# Patient Record
Sex: Male | Born: 1945 | Race: White | Hispanic: No | Marital: Married | Smoking: Never smoker
Health system: Southern US, Community
[De-identification: ages and names within clinical notes are randomized; demographics above are authoritative.]

## PROBLEM LIST (undated history)

## (undated) DIAGNOSIS — E119 Type 2 diabetes mellitus without complications: Secondary | ICD-10-CM

## (undated) DIAGNOSIS — L719 Rosacea, unspecified: Secondary | ICD-10-CM

## (undated) DIAGNOSIS — K219 Gastro-esophageal reflux disease without esophagitis: Secondary | ICD-10-CM

## (undated) DIAGNOSIS — I10 Essential (primary) hypertension: Secondary | ICD-10-CM

## (undated) DIAGNOSIS — K635 Polyp of colon: Secondary | ICD-10-CM

## (undated) DIAGNOSIS — I639 Cerebral infarction, unspecified: Secondary | ICD-10-CM

## (undated) DIAGNOSIS — G4730 Sleep apnea, unspecified: Secondary | ICD-10-CM

## (undated) DIAGNOSIS — E785 Hyperlipidemia, unspecified: Secondary | ICD-10-CM

## (undated) HISTORY — DX: Hyperlipidemia, unspecified: E78.5

## (undated) HISTORY — DX: Rosacea, unspecified: L71.9

## (undated) HISTORY — DX: Sleep apnea, unspecified: G47.30

## (undated) HISTORY — DX: Essential (primary) hypertension: I10

## (undated) HISTORY — DX: Type 2 diabetes mellitus without complications: E11.9

## (undated) HISTORY — PX: COLONOSCOPY: SHX174

## (undated) HISTORY — DX: Cerebral infarction, unspecified: I63.9

## (undated) HISTORY — DX: Polyp of colon: K63.5

---

## 1996-08-14 HISTORY — PX: ANAL FISSURE REPAIR: SHX2312

## 1998-08-14 HISTORY — PX: OTHER SURGICAL HISTORY: SHX169

## 2003-08-15 DIAGNOSIS — K635 Polyp of colon: Secondary | ICD-10-CM

## 2003-08-15 HISTORY — DX: Polyp of colon: K63.5

## 2004-07-28 ENCOUNTER — Ambulatory Visit: Payer: Self-pay | Admitting: Family Medicine

## 2004-08-04 ENCOUNTER — Ambulatory Visit: Payer: Self-pay | Admitting: General Surgery

## 2004-08-24 ENCOUNTER — Ambulatory Visit: Payer: Self-pay | Admitting: Urology

## 2004-12-12 ENCOUNTER — Ambulatory Visit: Payer: Self-pay | Admitting: Family Medicine

## 2007-07-08 ENCOUNTER — Ambulatory Visit: Payer: Self-pay | Admitting: Specialist

## 2007-07-08 ENCOUNTER — Other Ambulatory Visit: Payer: Self-pay

## 2007-07-15 HISTORY — PX: REPLACEMENT TOTAL KNEE: SUR1224

## 2007-07-18 ENCOUNTER — Inpatient Hospital Stay: Payer: Self-pay | Admitting: Specialist

## 2008-01-01 ENCOUNTER — Ambulatory Visit: Payer: Self-pay | Admitting: Specialist

## 2008-01-01 ENCOUNTER — Other Ambulatory Visit: Payer: Self-pay

## 2008-01-10 ENCOUNTER — Ambulatory Visit: Payer: Self-pay | Admitting: Specialist

## 2008-04-23 ENCOUNTER — Ambulatory Visit: Payer: Self-pay | Admitting: Specialist

## 2008-11-20 DIAGNOSIS — E1159 Type 2 diabetes mellitus with other circulatory complications: Secondary | ICD-10-CM | POA: Insufficient documentation

## 2008-11-20 DIAGNOSIS — Z9660 Presence of unspecified orthopedic joint implant: Secondary | ICD-10-CM | POA: Insufficient documentation

## 2008-11-20 DIAGNOSIS — E119 Type 2 diabetes mellitus without complications: Secondary | ICD-10-CM | POA: Insufficient documentation

## 2008-11-20 DIAGNOSIS — G4733 Obstructive sleep apnea (adult) (pediatric): Secondary | ICD-10-CM | POA: Insufficient documentation

## 2008-12-31 ENCOUNTER — Ambulatory Visit: Payer: Self-pay | Admitting: General Surgery

## 2009-02-20 ENCOUNTER — Emergency Department: Payer: Self-pay | Admitting: Emergency Medicine

## 2009-09-06 DIAGNOSIS — K219 Gastro-esophageal reflux disease without esophagitis: Secondary | ICD-10-CM | POA: Insufficient documentation

## 2009-09-09 ENCOUNTER — Ambulatory Visit: Payer: Self-pay | Admitting: Family Medicine

## 2009-09-22 ENCOUNTER — Ambulatory Visit: Payer: Self-pay | Admitting: Cardiology

## 2012-05-30 ENCOUNTER — Ambulatory Visit: Payer: Self-pay | Admitting: Family Medicine

## 2013-10-08 ENCOUNTER — Ambulatory Visit: Payer: Self-pay | Admitting: Urology

## 2013-12-15 IMAGING — CR SACRUM AND COCCYX - 2+ VIEW
1 series · 4 of 4 positions shown · non-contrast
Comparison: none

REASON FOR EXAM: coccyx pain
COMMENTS:

PROCEDURE:     KDR - KDXR SACRUM AND COCCYX  - May 30, 2012  [DATE]
RESULT:     Images of the sacrum and coccyx show the sacral arches appear
intact. No fracture or dislocation is evident. Sacroiliac joints appear to
be unremarkable.

[Series 1: ap · 0.17mm/px · 4 of 4 slices shown]
[im 1/4]
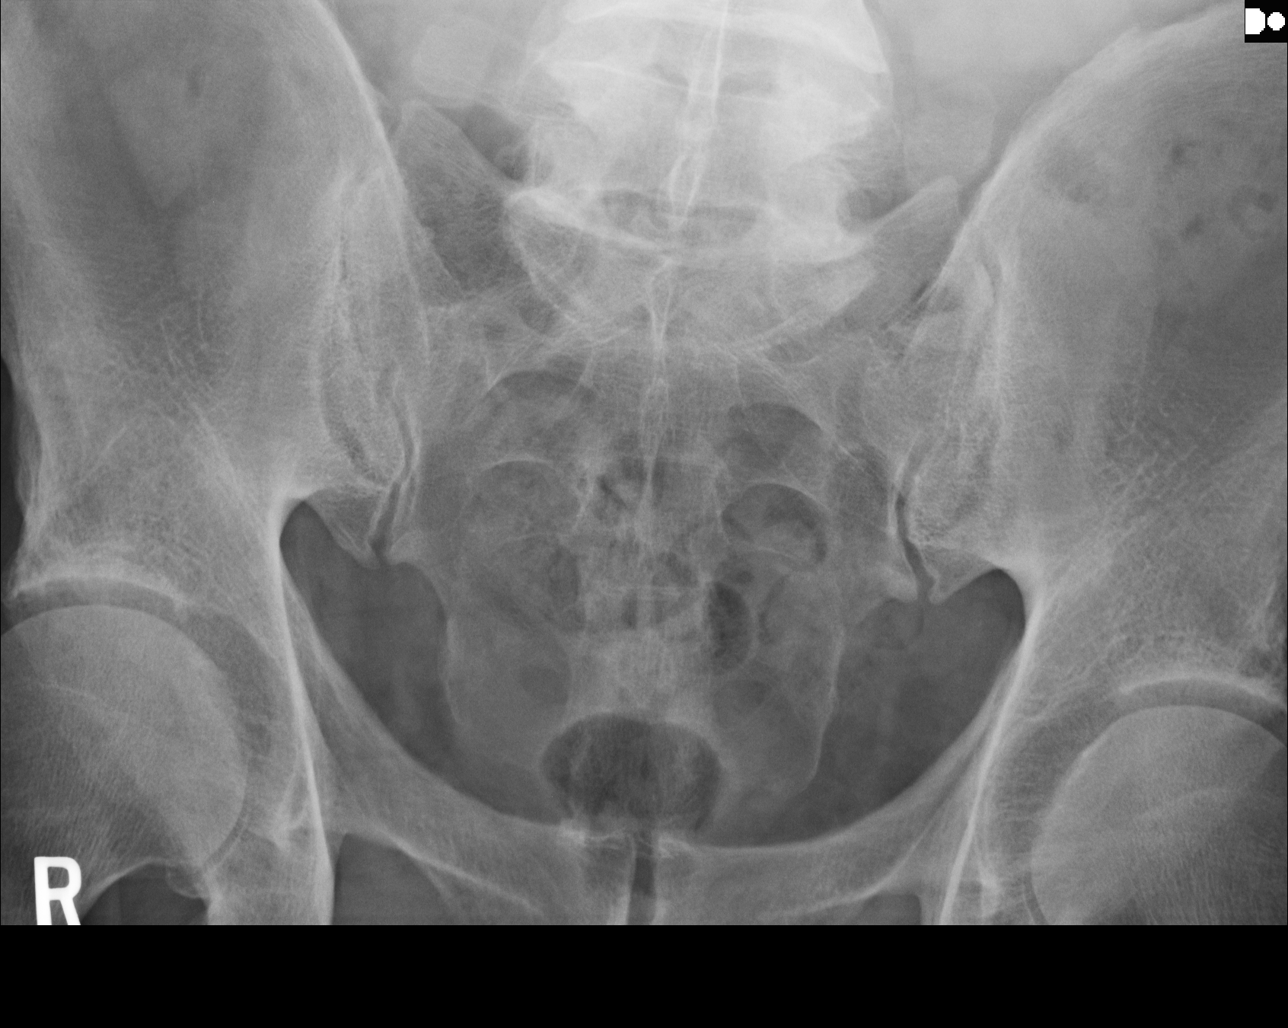
[im 2/4]
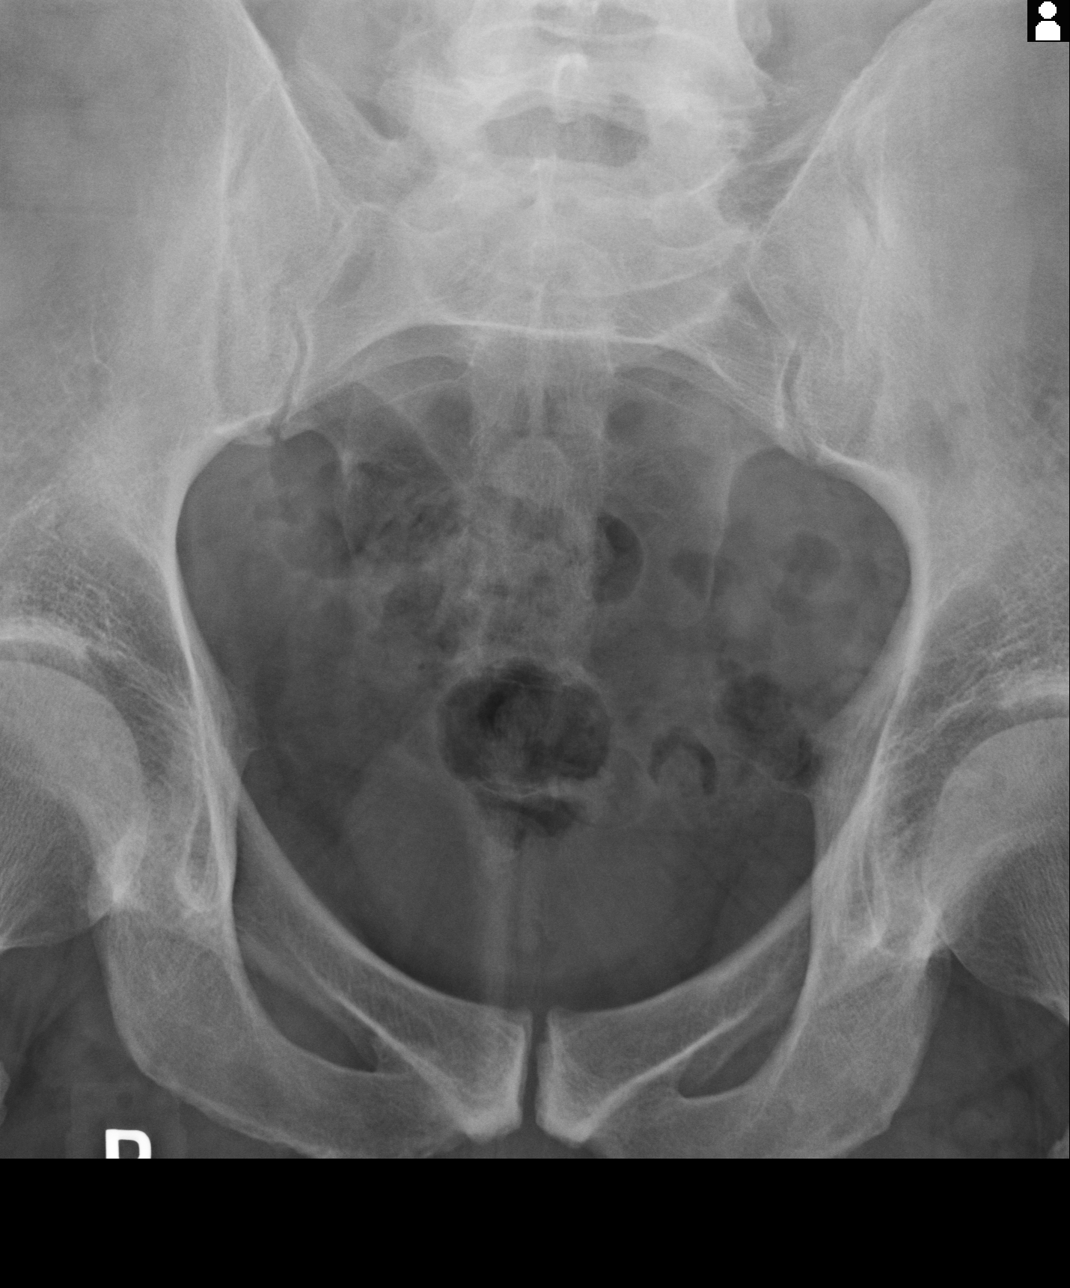
[im 3/4]
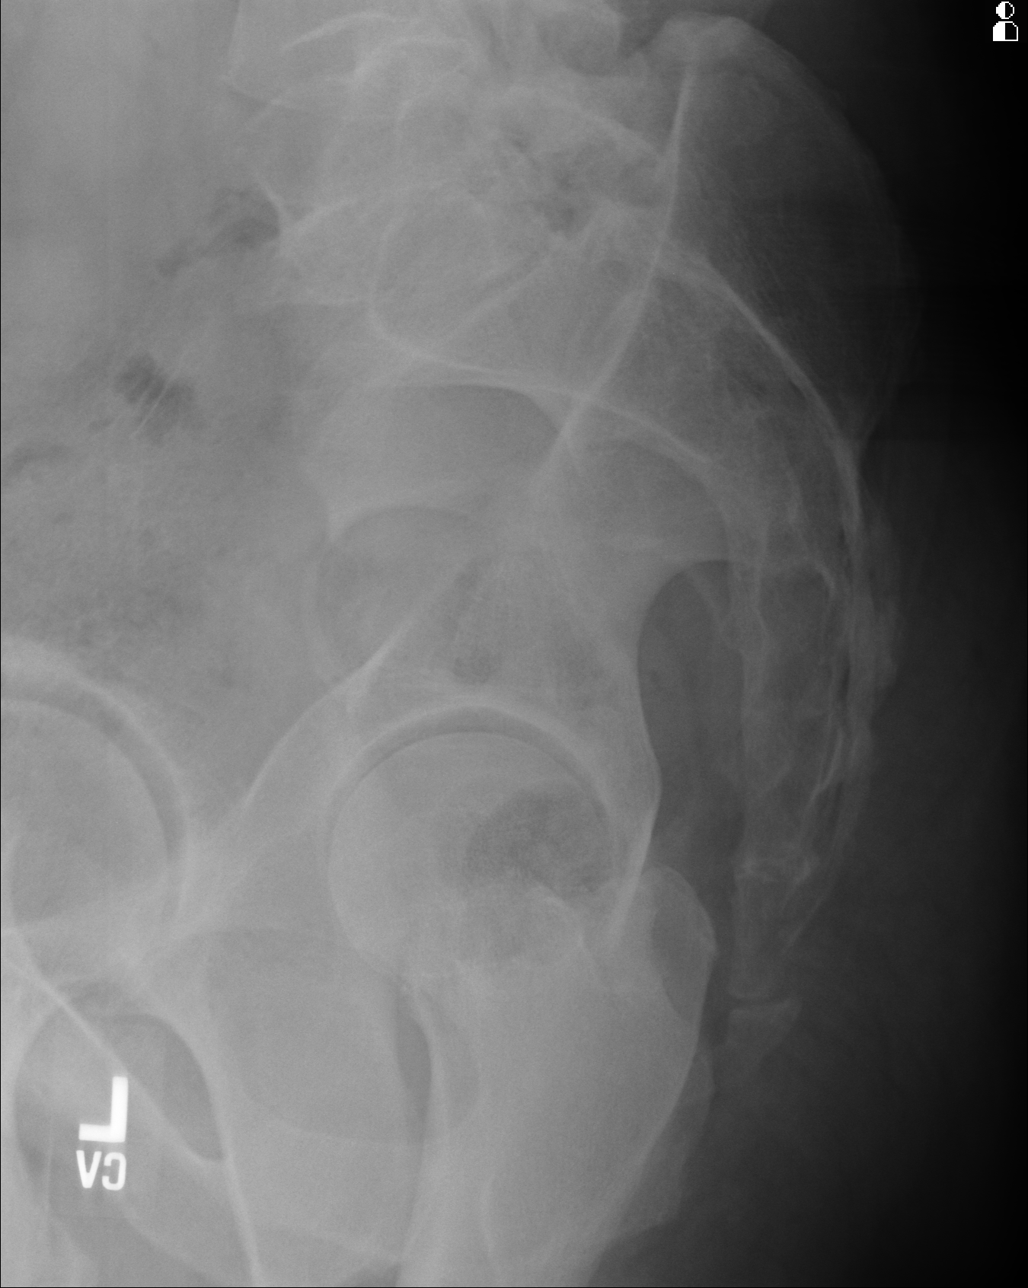
[im 4/4]
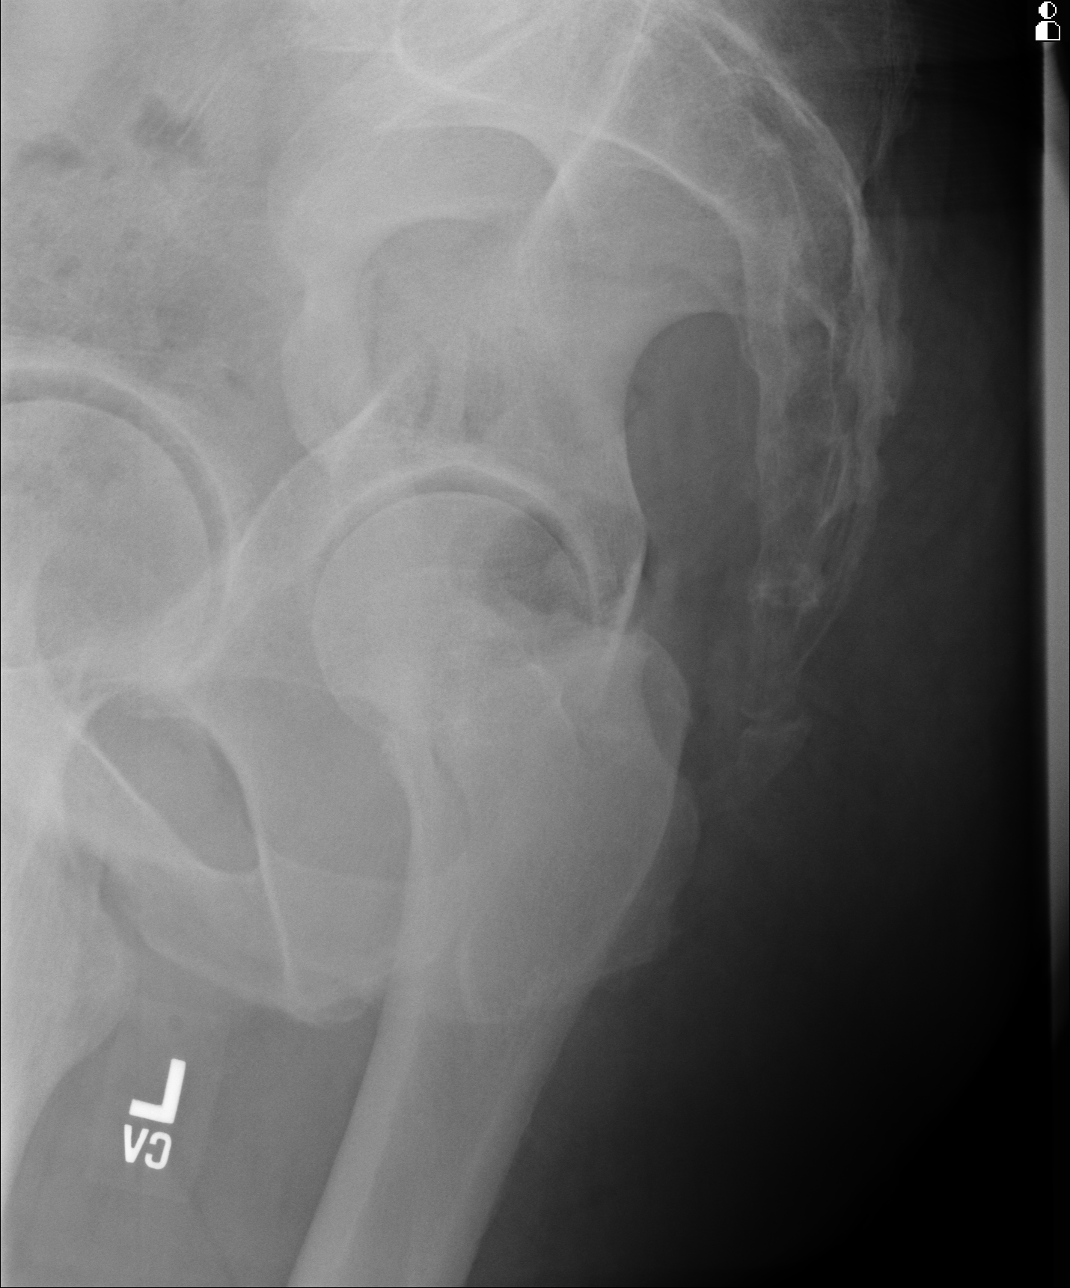

[4 of 4 positions shown; findings below may reference images not displayed]

IMPRESSION: No acute bony abnormality evident.

[REDACTED]

## 2014-07-06 LAB — LIPID PANEL
CHOLESTEROL: 115 mg/dL (ref 0–200)
HDL: 38 mg/dL (ref 35–70)
LDL Cholesterol: 51 mg/dL
TRIGLYCERIDES: 128 mg/dL (ref 40–160)

## 2014-07-06 LAB — BASIC METABOLIC PANEL
BUN: 14 mg/dL (ref 4–21)
Creatinine: 1.1 mg/dL (ref <=1.3)
Glucose: 128 mg/dL
Potassium: 3.8 mmol/L (ref 3.4–5.3)
SODIUM: 143 mmol/L (ref 137–147)

## 2014-07-06 LAB — HEPATIC FUNCTION PANEL
ALT: 32 U/L (ref 10–40)
AST: 22 U/L (ref 14–40)

## 2014-07-06 LAB — TSH: TSH: 2.04 u[IU]/mL (ref <=5.90)

## 2014-07-07 ENCOUNTER — Encounter: Payer: Self-pay | Admitting: *Deleted

## 2014-07-27 ENCOUNTER — Ambulatory Visit (INDEPENDENT_AMBULATORY_CARE_PROVIDER_SITE_OTHER): Payer: Medicare Other | Admitting: General Surgery

## 2014-07-27 ENCOUNTER — Encounter: Payer: Self-pay | Admitting: *Deleted

## 2014-07-27 ENCOUNTER — Other Ambulatory Visit: Payer: Self-pay | Admitting: *Deleted

## 2014-07-27 ENCOUNTER — Encounter: Payer: Self-pay | Admitting: General Surgery

## 2014-07-27 VITALS — BP 130/72 | HR 72 | Resp 14 | Ht 74.0 in | Wt 240.0 lb

## 2014-07-27 DIAGNOSIS — Z86010 Personal history of colonic polyps: Secondary | ICD-10-CM

## 2014-07-27 LAB — CBC AND DIFFERENTIAL
HEMATOCRIT: 43 % (ref 41–53)
Hemoglobin: 15.1 g/dL (ref 13.5–17.5)
PLATELETS: 155 10*3/uL (ref 150–399)
WBC: 5.6 10^3/mL

## 2014-07-27 MED ORDER — POLYETHYLENE GLYCOL 3350 17 GM/SCOOP PO POWD
ORAL | Status: DC
Start: 2014-07-27 — End: 2015-01-21

## 2014-07-27 NOTE — Progress Notes (Signed)
Patient ID: BRECK MARYLAND, male   DOB: 05/15/1946, 68 y.o.   MRN: [id number]  Patient has been scheduled for a colonoscopy on 09-02-14 at [facility].   This patient has been asked to hold metformin and glipizide day of prep and procedure.

## 2014-07-27 NOTE — Patient Instructions (Signed)
Colonoscopy  A colonoscopy is an exam to look at the entire large intestine (colon). This exam can help find problems such as tumors, polyps, inflammation, and areas of bleeding. The exam takes about 1 hour.   LET YOUR HEALTH CARE PROVIDER KNOW ABOUT:   · Any allergies you have.  · All medicines you are taking, including vitamins, herbs, eye drops, creams, and over-the-counter medicines.  · Previous problems you or members of your family have had with the use of anesthetics.  · Any blood disorders you have.  · Previous surgeries you have had.  · Medical conditions you have.  RISKS AND COMPLICATIONS   Generally, this is a safe procedure. However, as with any procedure, complications can occur. Possible complications include:  · Bleeding.  · Tearing or rupture of the colon wall.  · Reaction to medicines given during the exam.  · Infection (rare).  BEFORE THE PROCEDURE   · Ask your health care provider about changing or stopping your regular medicines.  · You may be prescribed an oral bowel prep. This involves drinking a large amount of medicated liquid, starting the day before your procedure. The liquid will cause you to have multiple loose stools until your stool is almost clear or light green. This cleans out your colon in preparation for the procedure.  · Do not eat or drink anything else once you have started the bowel prep, unless your health care provider tells you it is safe to do so.  · Arrange for someone to drive you home after the procedure.  PROCEDURE   · You will be given medicine to help you relax (sedative).  · You will lie on your side with your knees bent.  · A long, flexible tube with a light and camera on the end (colonoscope) will be inserted through the rectum and into the colon. The camera sends video back to a computer screen as it moves through the colon. The colonoscope also releases carbon dioxide gas to inflate the colon. This helps your health care provider see the area better.  · During  the exam, your health care provider may take a small tissue sample (biopsy) to be examined under a microscope if any abnormalities are found.  · The exam is finished when the entire colon has been viewed.  AFTER THE PROCEDURE   · Do not drive for 24 hours after the exam.  · You may have a small amount of blood in your stool.  · You may pass moderate amounts of gas and have mild abdominal cramping or bloating. This is caused by the gas used to inflate your colon during the exam.  · Ask when your test results will be ready and how you will get your results. Make sure you get your test results.  Document Released: 07/28/2000 Document Revised: 05/21/2013 Document Reviewed: 04/07/2013  [employer]® Patient Information ©2015 [employer]. This information is not intended to replace advice given to you by your health care provider. Make sure you discuss any questions you have with your health care provider.

## 2014-07-27 NOTE — Progress Notes (Signed)
Patient ID: ESAIAS CLEAVENGER, male   DOB: March 05, 1946, 68 y.o.   MRN: [id number]  Chief Complaint  Patient presents with  . Follow-up    colonoscopy    HPI EGE MUCKEY is a 68 y.o. male  Here for colonoscopy discussion. This was last done in 2010. He reports he has had runny bowel movements for about 6-8 months that have worsened in the last 3-4 months. His doctor tried stopping his Metformin which did not change anything. He is now back on Metformin with no change in bowel movements.   HPI  Past Medical History  Diagnosis Date  . Rosacea   . Diabetes mellitus   . Hyperlipidemia   . Sleep apnea   . Hypertension   . Colon polyp 2005    Past Surgical History  Procedure Laterality Date  . Anal fissure repair  1998  . Shoulder spurs  2000  . Colonoscopy  2005, 2010  . 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 No pertinent family history.  Social History History  Substance Use Topics  . Smoking status: Never Smoker   . Smokeless tobacco: Never Used  . Alcohol Use: 0.0 oz/week    0 Not specified per week    No Known Allergies  Current Outpatient Prescriptions  Medication Sig Dispense Refill  . aspirin 81 MG tablet Take 81 mg by mouth daily.    Marland Kitchen doxycycline (VIBRAMYCIN) 100 MG capsule Take 100 mg by mouth daily.  4  . glipiZIDE (GLUCOTROL XL) 5 MG 24 hr tablet   5  . loratadine-pseudoephedrine (CLARITIN-D 24-HOUR) 10-240 MG per 24 hr tablet Take 1 tablet by mouth daily as needed for allergies.    Marland Kitchen losartan-hydrochlorothiazide (HYZAAR) 100-12.5 MG per tablet   5  . metFORMIN (GLUCOPHAGE) 1000 MG tablet Take 1,000 mg by mouth 2 (two) times daily.  5  . metoprolol succinate (TOPROL-XL) 25 MG 24 hr tablet   5  . omeprazole (PRILOSEC) 40 MG capsule Take 40 mg by mouth daily.  11  . simvastatin (ZOCOR) 20 MG tablet   5  . triazolam (HALCION) 0.125 MG tablet Take 0.125 mg by mouth at bedtime as needed for sleep.    . vitamin B-12 (CYANOCOBALAMIN) 1000 MCG  tablet Take 1,000 mcg by mouth daily.     No current facility-administered medications for this visit.    Review of Systems Review of Systems  Constitutional: Negative.   Respiratory: Negative.   Cardiovascular: Negative.   Gastrointestinal: Positive for diarrhea. Negative for nausea, vomiting, abdominal pain, constipation, blood in stool, abdominal distention, anal bleeding and rectal pain.    Blood pressure 130/72, pulse 72, resp. rate 14, height 6\' 2"  (1.88 m), weight 240 lb (108.863 kg).  Physical Exam Physical Exam  Constitutional: He is oriented to person, place, and time. He appears well-developed and well-nourished.  Eyes: Conjunctivae are normal. No scleral icterus.  Neck: Neck supple.  Cardiovascular: Normal rate, regular rhythm and normal heart sounds.   Pulmonary/Chest: Effort normal and breath sounds normal.  Abdominal: Soft. Normal appearance and bowel sounds are normal. There is no tenderness. No hernia.  Lymphadenopathy:    He has no cervical adenopathy.  Neurological: He is alert and oriented to person, place, and time.    Data Reviewed Prior colonoscopy.  Assessment    History of adenomatous colon polyp     Plan    Surveillance colonoscopy. Discussed with pt and he is agreeable. He is familiar  with procedure, risks and benefits.   Cc: Margarita Rana, M.D.       SANKAR,SEEPLAPUTHUR G 07/27/2014, 10:10 AM

## 2014-08-27 ENCOUNTER — Other Ambulatory Visit: Payer: Self-pay | Admitting: General Surgery

## 2014-08-27 DIAGNOSIS — Z86010 Personal history of colonic polyps: Secondary | ICD-10-CM

## 2014-09-02 ENCOUNTER — Ambulatory Visit: Payer: Self-pay | Admitting: General Surgery

## 2014-09-02 DIAGNOSIS — Z86010 Personal history of colonic polyps: Secondary | ICD-10-CM

## 2014-09-02 DIAGNOSIS — K648 Other hemorrhoids: Secondary | ICD-10-CM

## 2014-09-02 DIAGNOSIS — K5730 Diverticulosis of large intestine without perforation or abscess without bleeding: Secondary | ICD-10-CM

## 2014-09-03 ENCOUNTER — Encounter: Payer: Self-pay | Admitting: General Surgery

## 2014-09-08 ENCOUNTER — Encounter: Payer: Self-pay | Admitting: General Surgery

## 2014-09-10 LAB — HM DIABETES EYE EXAM

## 2014-10-05 LAB — HEMOGLOBIN A1C: Hgb A1c MFr Bld: 7.4 % — AB (ref 4.0–6.0)

## 2014-10-09 DIAGNOSIS — R0789 Other chest pain: Secondary | ICD-10-CM | POA: Insufficient documentation

## 2014-10-29 ENCOUNTER — Ambulatory Visit: Payer: Self-pay | Admitting: Gastroenterology

## 2014-10-29 LAB — HM COLONOSCOPY

## 2014-12-07 LAB — SURGICAL PATHOLOGY

## 2015-01-07 DIAGNOSIS — E1169 Type 2 diabetes mellitus with other specified complication: Secondary | ICD-10-CM | POA: Insufficient documentation

## 2015-01-07 DIAGNOSIS — E785 Hyperlipidemia, unspecified: Secondary | ICD-10-CM | POA: Insufficient documentation

## 2015-01-07 DIAGNOSIS — IMO0002 Reserved for concepts with insufficient information to code with codable children: Secondary | ICD-10-CM | POA: Insufficient documentation

## 2015-01-07 DIAGNOSIS — R748 Abnormal levels of other serum enzymes: Secondary | ICD-10-CM | POA: Insufficient documentation

## 2015-01-07 DIAGNOSIS — M179 Osteoarthritis of knee, unspecified: Secondary | ICD-10-CM | POA: Insufficient documentation

## 2015-01-07 DIAGNOSIS — E538 Deficiency of other specified B group vitamins: Secondary | ICD-10-CM | POA: Insufficient documentation

## 2015-01-07 DIAGNOSIS — K760 Fatty (change of) liver, not elsewhere classified: Secondary | ICD-10-CM | POA: Insufficient documentation

## 2015-01-07 DIAGNOSIS — M1710 Unilateral primary osteoarthritis, unspecified knee: Secondary | ICD-10-CM | POA: Insufficient documentation

## 2015-01-07 DIAGNOSIS — N509 Disorder of male genital organs, unspecified: Secondary | ICD-10-CM | POA: Insufficient documentation

## 2015-01-07 DIAGNOSIS — L719 Rosacea, unspecified: Secondary | ICD-10-CM | POA: Insufficient documentation

## 2015-01-07 DIAGNOSIS — G4700 Insomnia, unspecified: Secondary | ICD-10-CM | POA: Insufficient documentation

## 2015-01-14 ENCOUNTER — Ambulatory Visit: Payer: Self-pay | Admitting: Family Medicine

## 2015-01-21 ENCOUNTER — Encounter: Payer: Self-pay | Admitting: Family Medicine

## 2015-01-21 ENCOUNTER — Ambulatory Visit (INDEPENDENT_AMBULATORY_CARE_PROVIDER_SITE_OTHER): Payer: Medicare Other | Admitting: Family Medicine

## 2015-01-21 VITALS — BP 120/64 | HR 88 | Temp 97.8°F | Resp 16 | Ht 74.0 in | Wt 239.0 lb

## 2015-01-21 DIAGNOSIS — Z1389 Encounter for screening for other disorder: Secondary | ICD-10-CM | POA: Diagnosis not present

## 2015-01-21 DIAGNOSIS — R0789 Other chest pain: Secondary | ICD-10-CM | POA: Diagnosis not present

## 2015-01-21 DIAGNOSIS — R234 Changes in skin texture: Secondary | ICD-10-CM | POA: Diagnosis not present

## 2015-01-21 DIAGNOSIS — E7800 Pure hypercholesterolemia, unspecified: Secondary | ICD-10-CM

## 2015-01-21 DIAGNOSIS — K589 Irritable bowel syndrome without diarrhea: Secondary | ICD-10-CM | POA: Diagnosis not present

## 2015-01-21 DIAGNOSIS — E119 Type 2 diabetes mellitus without complications: Secondary | ICD-10-CM | POA: Diagnosis not present

## 2015-01-21 DIAGNOSIS — I10 Essential (primary) hypertension: Secondary | ICD-10-CM | POA: Diagnosis not present

## 2015-01-21 DIAGNOSIS — R239 Unspecified skin changes: Secondary | ICD-10-CM

## 2015-01-21 LAB — POCT GLYCOSYLATED HEMOGLOBIN (HGB A1C): Hemoglobin A1C: 7

## 2015-01-21 MED ORDER — DOXYCYCLINE HYCLATE 100 MG PO CAPS: 100.0000 mg | ORAL_CAPSULE | Freq: Every day | ORAL | Status: DC

## 2015-01-21 MED ORDER — METFORMIN HCL 1000 MG PO TABS: 1000.0000 mg | ORAL_TABLET | Freq: Two times a day (BID) | ORAL | Status: DC

## 2015-01-21 MED ORDER — SIMVASTATIN 20 MG PO TABS: 20.0000 mg | ORAL_TABLET | Freq: Every evening | ORAL | Status: DC

## 2015-01-21 NOTE — Progress Notes (Signed)
Subjective:    Patient ID: Ivan Kennedy, male    DOB: December 05, 1945, 69 y.o.   MRN: [id number]  HPI Comments: Pt is requesting refills Simvastatin, Doxy, and Metformin.  Pt's last A1C was 10/05/2014, and the results was 7.4% Pt's last BP on 10/05/2014 and was 128/76.  Diabetes He presents for his follow-up diabetic visit. He has type 2 diabetes mellitus. There are no hypoglycemic associated symptoms. Pertinent negatives for hypoglycemia include no headaches. Associated symptoms include chest pain (F/B Dr. Ubaldo Glassing for this problem, negative work up). Pertinent negatives for diabetes include no blurred vision, no fatigue, no foot paresthesias, no foot ulcerations, no polydipsia, no polyphagia, no polyuria, no visual change, no weakness and no weight loss. Current diabetic treatment includes oral agent (dual therapy). He is compliant with treatment all of the time. His weight is stable. He is following a generally healthy diet. He participates in exercise three times a week (pt walks). His home blood glucose trend is fluctuating minimally. His breakfast blood glucose is taken between 6-7 am. His breakfast blood glucose range is generally 110-130 mg/dl. (Pt reports BS can range from 110s-140s) He does not see a podiatrist.Eye exam is current.  Hypertension This is a chronic problem. The problem is controlled. Associated symptoms include chest pain (F/B Dr. Ubaldo Glassing for this problem, negative work up). Pertinent negatives include no anxiety, blurred vision, headaches, malaise/fatigue, neck pain, orthopnea, palpitations, peripheral edema or shortness of breath.   Chest pain at times still . Not related to stress.  Is on acid reflux pill. Has had an EGD years ago. Not recently.  Was recently diagnosed with IBS. No chest symptoms today.  Was working hard today with no pain. Can not really relate it to food. Still happens frequently.     Review of Systems  Constitutional: Negative for weight loss,  malaise/fatigue and fatigue.  Eyes: Negative for blurred vision.  Respiratory: Negative for shortness of breath.   Cardiovascular: Positive for chest pain (F/B Dr. Ubaldo Glassing for this problem, negative work up). Negative for palpitations and orthopnea.  Endocrine: Negative for polydipsia, polyphagia and polyuria.  Musculoskeletal: Negative for neck pain.  Neurological: Negative for weakness and headaches.   BP 120/64 mmHg  Pulse 88  Temp(Src) 97.8 F (36.6 C) (Oral)  Resp 16  Ht 6\' 2"  (1.88 m)  Wt 239 lb (108.41 kg)  BMI 30.67 kg/m2   Past Medical History  Diagnosis Date  . Rosacea   . Diabetes mellitus   . Hyperlipidemia   . Sleep apnea   . Hypertension   . Colon polyp 2005   Past Surgical History  Procedure Laterality Date  . Anal fissure repair  1998  . Shoulder spurs Right 2000  . Colonoscopy  2005, 2010  . 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 Earnestine Leys    reports that he has never smoked. He has never used smokeless tobacco. He reports that he drinks alcohol. He reports that he does not use illicit drugs. family history includes Diabetes in his mother. No Known Allergies  Current Outpatient Prescriptions on File Prior to Visit  Medication Sig Dispense Refill  . aspirin 81 MG tablet Take 81 mg by mouth daily.    Marland Kitchen glipiZIDE (GLUCOTROL XL) 5 MG 24 hr tablet   5  . loratadine-pseudoephedrine (CLARITIN-D 24-HOUR) 10-240 MG per 24 hr tablet Take 1 tablet by mouth daily as needed for allergies.    Marland Kitchen losartan-hydrochlorothiazide (HYZAAR) 100-12.5 MG per tablet   5  .  metoprolol succinate (TOPROL-XL) 25 MG 24 hr tablet   5  . omeprazole (PRILOSEC) 40 MG capsule Take 40 mg by mouth daily.  11  . vitamin B-12 (CYANOCOBALAMIN) 1000 MCG tablet Take 1,000 mcg by mouth daily.     No current facility-administered medications on file prior to visit.      Objective:   Physical Exam  Constitutional: He is oriented to person, place, and time. He appears well-developed.   Cardiovascular: Normal rate and regular rhythm.   Pulmonary/Chest: Effort normal and breath sounds normal.  Neurological: He is alert and oriented to person, place, and time.  Psychiatric: He has a normal mood and affect.    BP 120/64 mmHg  Pulse 88  Temp(Src) 97.8 F (36.6 C) (Oral)  Resp 16  Ht 6\' 2"  (1.88 m)  Wt 239 lb (108.41 kg)  BMI 30.67 kg/m2       Assessment & Plan:   1. Essential hypertension  Condition is stable. Please continue current medication and  plan of care as noted.    2. Type 2 diabetes mellitus without complication Improved. Continue current plan of care. Recheck in 3 months.   - POCT HgB A1C  3. Hypercholesteremia  Condition is stable. Please continue current medication and  plan of care as noted.    4. IBS (irritable bowel syndrome) Being treated by Dr. Allen Norris. Improving.   5. Chest pain, atypical  Negative cardiac work up. Patient is going to focus on better history of when it happens and follow up is continues.   6. Recent skin changes  Referral to Dermatology.

## 2015-02-08 ENCOUNTER — Other Ambulatory Visit: Payer: Self-pay | Admitting: Family Medicine

## 2015-02-08 DIAGNOSIS — K219 Gastro-esophageal reflux disease without esophagitis: Secondary | ICD-10-CM

## 2015-04-21 ENCOUNTER — Encounter: Payer: Self-pay | Admitting: Family Medicine

## 2015-04-21 ENCOUNTER — Ambulatory Visit (INDEPENDENT_AMBULATORY_CARE_PROVIDER_SITE_OTHER): Payer: Medicare Other | Admitting: Family Medicine

## 2015-04-21 VITALS — BP 128/76 | HR 68 | Temp 97.8°F | Resp 16 | Wt 241.0 lb

## 2015-04-21 DIAGNOSIS — E119 Type 2 diabetes mellitus without complications: Secondary | ICD-10-CM | POA: Diagnosis not present

## 2015-04-21 DIAGNOSIS — G4700 Insomnia, unspecified: Secondary | ICD-10-CM | POA: Diagnosis not present

## 2015-04-21 DIAGNOSIS — I10 Essential (primary) hypertension: Secondary | ICD-10-CM

## 2015-04-21 LAB — POCT GLYCOSYLATED HEMOGLOBIN (HGB A1C): Hemoglobin A1C: 6.5

## 2015-04-21 MED ORDER — GLIPIZIDE ER 5 MG PO TB24: 5.0000 mg | ORAL_TABLET | Freq: Two times a day (BID) | ORAL | Status: DC

## 2015-04-21 MED ORDER — LOSARTAN POTASSIUM-HCTZ 100-12.5 MG PO TABS: 1.0000 | ORAL_TABLET | Freq: Every day | ORAL | Status: DC

## 2015-04-21 NOTE — Progress Notes (Signed)
Patient: Ivan Kennedy Male    DOB: Aug 05, 1946   69 y.o.   MRN: [id number] Visit Date: 04/21/2015  Today's Provider: Margarita Rana, MD   Chief Complaint  Patient presents with  . Diabetes  . Hypertension  . Insomnia  . Hyperlipidemia   Subjective:    Diabetes He presents for his follow-up diabetic visit. He has type 2 diabetes mellitus. His disease course has been stable. Associated symptoms include fatigue (Still a little fatiqued in the mornings but reports has improved since using his CPAP.). Pertinent negatives for diabetes include no blurred vision, no chest pain, no foot paresthesias, no foot ulcerations, no polydipsia, no polyphagia, no polyuria, no visual change, no weakness and no weight loss. Symptoms are stable. Risk factors for coronary artery disease include dyslipidemia, diabetes mellitus, hypertension and male sex. Current diabetic treatment includes oral agent (dual therapy). He is compliant with treatment all of the time. His weight is stable. He is following a diabetic (Eating less) diet. His overall blood glucose range is 110-130 mg/dl. He does not see a podiatrist.Eye exam is current.  Hypertension This is a chronic problem. The problem is unchanged. The problem is controlled. Pertinent negatives include no blurred vision or chest pain. Risk factors for coronary artery disease include diabetes mellitus, dyslipidemia and male gender. There are no compliance problems.   Hyperlipidemia This is a chronic problem. The problem is controlled. Recent lipid tests were reviewed and are normal. Pertinent negatives include no chest pain. Current antihyperlipidemic treatment includes statins. There are no compliance problems.  Risk factors for coronary artery disease include diabetes mellitus, dyslipidemia, male sex and hypertension.   Lab Results  Component Value Date   HGBA1C 7.0 01/21/2015     Lab Results  Component Value Date   CHOL 115 07/06/2014   HDL 38  07/06/2014   LDLCALC 51 07/06/2014   TRIG 128 07/06/2014     No Known Allergies Previous Medications   ASPIRIN 81 MG TABLET    Take 81 mg by mouth daily.   DIPHENOXYLATE-ATROPINE (LOMOTIL) 2.5-0.025 MG PER TABLET    Take 1 tablet by mouth 2 (two) times daily.   DOXYCYCLINE (ORACEA) 40 MG CAPSULE    Take 40 mg by mouth every morning.   FLUOROURACIL (EFUDEX) 5 % CREAM    APPLY TO AFFECTED AREA EVERY NIGHT FOR 6 WEEKS   LORATADINE-PSEUDOEPHEDRINE (CLARITIN-D 24-HOUR) 10-240 MG PER 24 HR TABLET    Take 1 tablet by mouth daily as needed for allergies.   METFORMIN (GLUCOPHAGE) 1000 MG TABLET    Take 1 tablet (1,000 mg total) by mouth 2 (two) times daily.   METOPROLOL SUCCINATE (TOPROL-XL) 25 MG 24 HR TABLET       METRONIDAZOLE (METROGEL) 1 % GEL    APPLY TO ROSACEA ONCE DAILY.   OMEPRAZOLE (PRILOSEC) 40 MG CAPSULE    1 CAPSULE DR, ORAL, DAILY   SIMVASTATIN (ZOCOR) 20 MG TABLET    Take 1 tablet (20 mg total) by mouth every evening.   VITAMIN B-12 (CYANOCOBALAMIN) 1000 MCG TABLET    Take 1,000 mcg by mouth daily.    Review of Systems  Constitutional: Positive for fatigue (Still a little fatiqued in the mornings but reports has improved since using his CPAP.). Negative for fever, chills, weight loss, diaphoresis, activity change, appetite change and unexpected weight change.  Eyes: Negative for blurred vision.  Respiratory: Negative.   Cardiovascular: Negative.  Negative for chest pain.  Gastrointestinal: Negative.  Endocrine: Negative for polydipsia, polyphagia and polyuria.  Musculoskeletal: Negative.   Neurological: Negative.  Negative for weakness.    Social History  Substance Use Topics  . Smoking status: Never Smoker   . Smokeless tobacco: Never Used  . Alcohol Use: 0.0 oz/week    0 Standard drinks or equivalent per week     Comment: occasionally   Objective:   BP 128/76 mmHg  Pulse 68  Temp(Src) 97.8 F (36.6 C) (Oral)  Resp 16  Wt 241 lb (109.317 kg)  Physical Exam    Constitutional: He is oriented to person, place, and time. He appears well-developed and well-nourished.  Cardiovascular: Normal rate and regular rhythm.   Pulmonary/Chest: Effort normal and breath sounds normal.  Neurological: He is alert and oriented to person, place, and time.  Psychiatric: He has a normal mood and affect. His behavior is normal. Judgment and thought content normal.   Diabetic Foot Exam - Simple   Simple Foot Form  Diabetic Foot exam was performed with the following findings:  Yes 04/21/2015  3:28 PM  Visual Inspection  No deformities, no ulcerations, no other skin breakdown bilaterally:  Yes  Sensation Testing  Intact to touch and monofilament testing bilaterally:  Yes  Pulse Check  Posterior Tibialis and Dorsalis pulse intact bilaterally:  Yes  Comments         Assessment & Plan:     1. Type 2 diabetes mellitus without complication Condition is stable, HgbA1c improved. Please continue current medication and  plan of care as noted.  Recheck in 3 months.  - POCT glycosylated hemoglobin (Hb A1C) - glipiZIDE (GLUCOTROL XL) 5 MG 24 hr tablet; Take 1 tablet (5 mg total) by mouth 2 (two) times daily.  Dispense: 180 tablet; Refill: 3 Results for orders placed or performed in visit on 04/21/15  POCT glycosylated hemoglobin (Hb A1C)  Result Value Ref Range   Hemoglobin A1C 6.5   HM DIABETES EYE EXAM  Result Value Ref Range   HM Diabetic Eye Exam No Retinopathy No Retinopathy    2. Essential hypertension Condition is stable. Please continue current medication and  plan of care as noted.   - losartan-hydrochlorothiazide (HYZAAR) 100-12.5 MG per tablet; Take 1 tablet by mouth daily.  Dispense: 90 tablet; Refill: 3  3. Hyperlipidemia- Stable. Continue current medication. Recheck at next OV. Tolerating medication without any difficulty.      Patient was seen and examined by Jerrell Belfast, MD, and note scribed, in part,  by Ashley Royalty, [place].  I have reviewed the  document for accuracy and completeness and I agree with above. - Jerrell Belfast, MD   Margarita Rana, MD  [facility]

## 2015-04-25 IMAGING — US US SCROTUM W/ DOPPLER COMPLETE
1 series · 14 of 25 positions shown · non-contrast
Comparison: None.

CLINICAL DATA: Evaluate scrotal mass.

EXAM:
SCROTAL ULTRASOUND
DOPPLER ULTRASOUND OF THE TESTICLES
TECHNIQUE: Complete ultrasound examination of the testicles, epididymis, and
other scrotal structures was performed. Color and spectral Doppler
ultrasound were also utilized to evaluate blood flow to the
testicles.

[Series 1: us scrotum w/ doppler complete · 0.08mm/px · 14 of 56 slices shown]
[im 1/56]
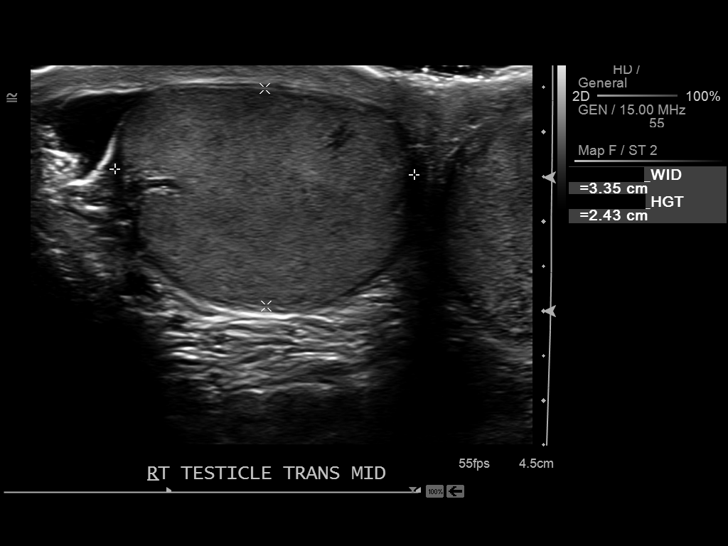
[im 5/56]
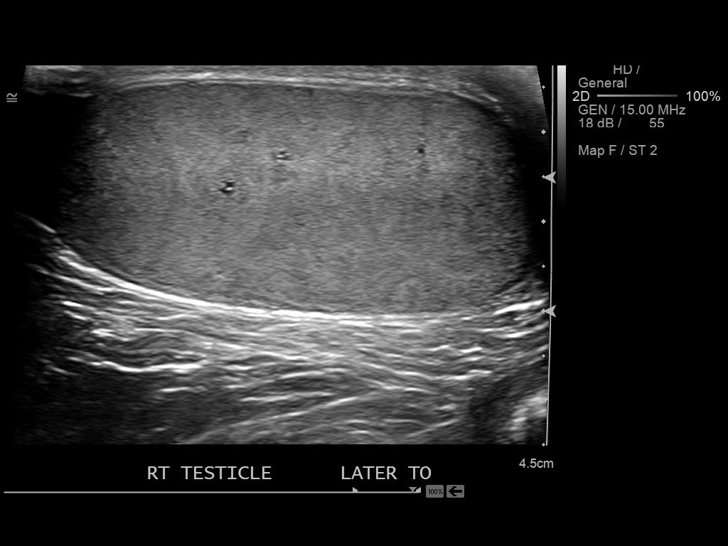
[im 10/56]
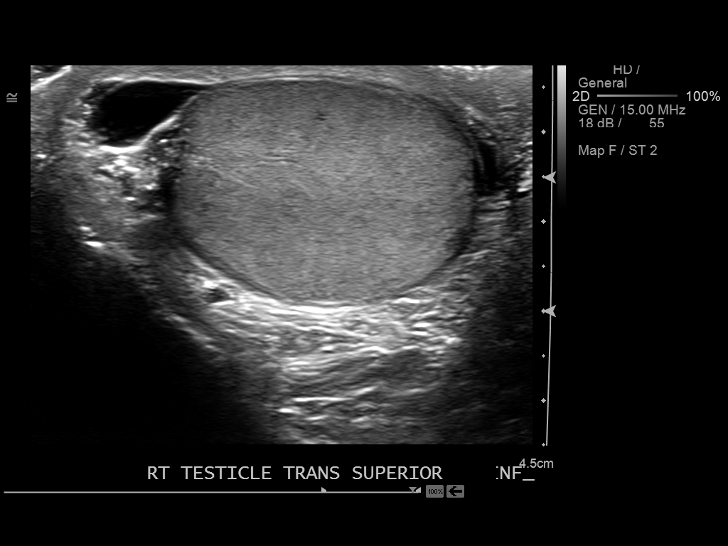
[im 14/56]
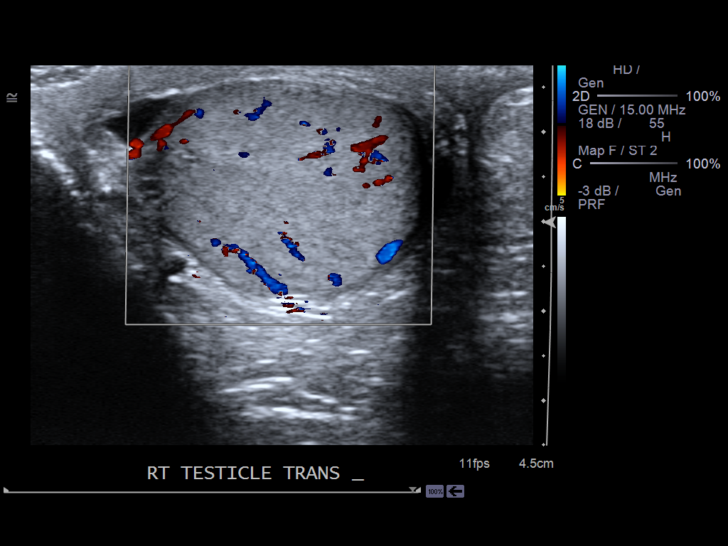
[im 19/56]
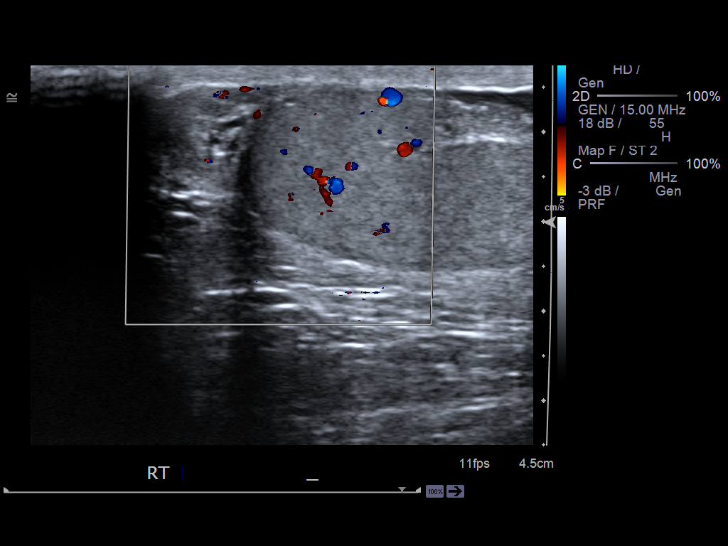
[im 21/56]
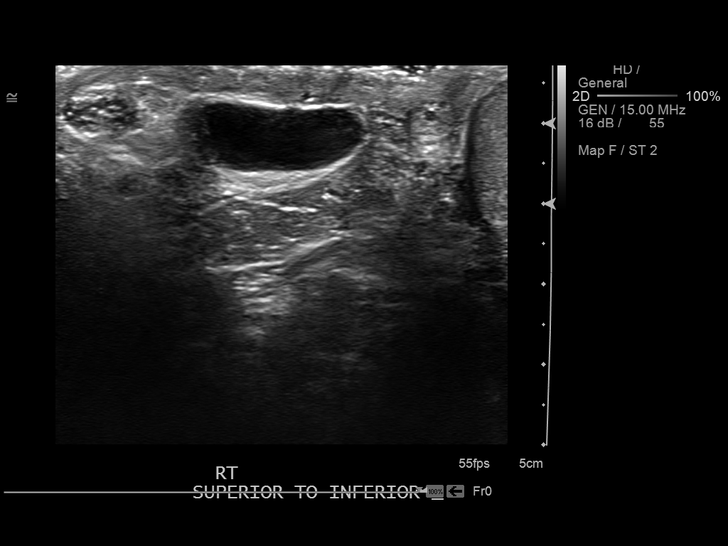
[im 26/56]
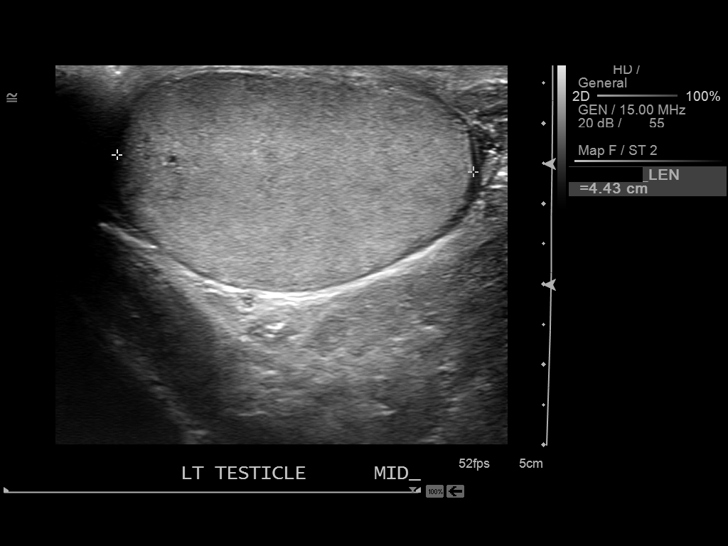
[im 30/56]
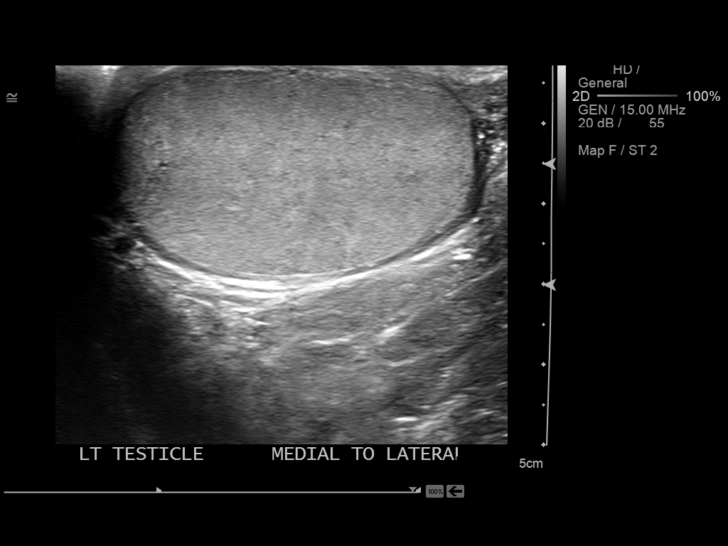
[im 35/56]
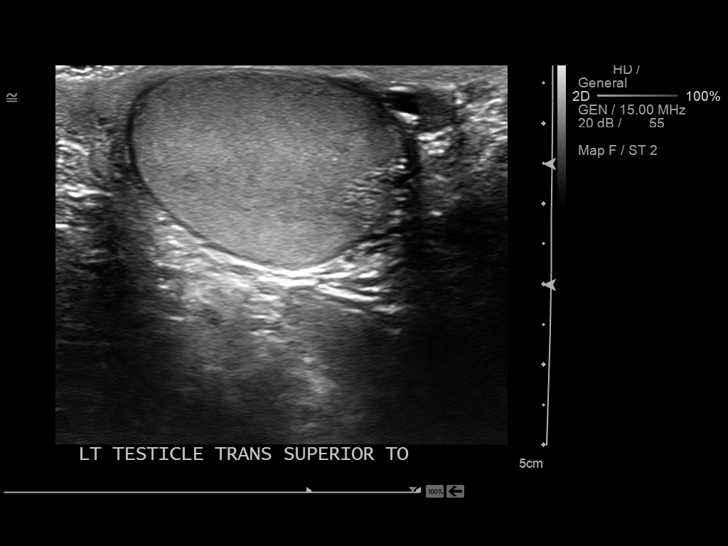
[im 37/56]
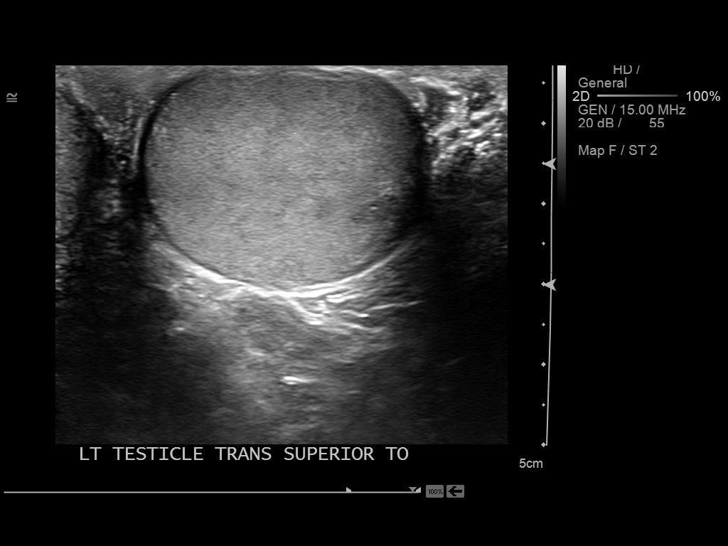
[im 42/56]
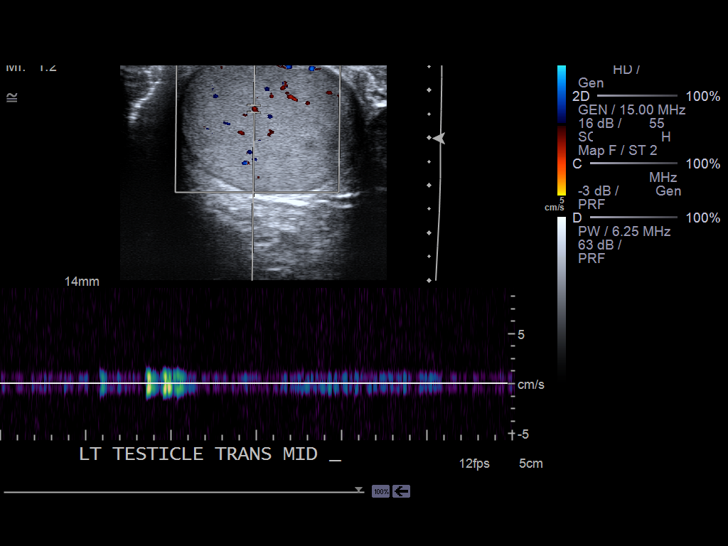
[im 46/56]
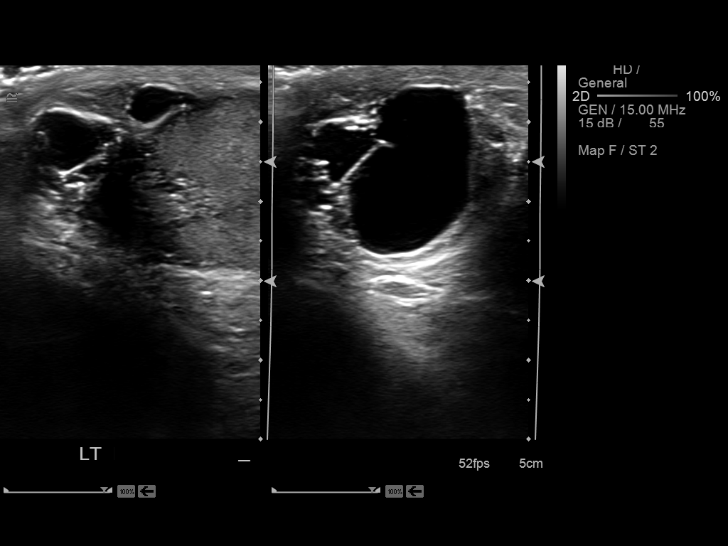
[im 51/56]
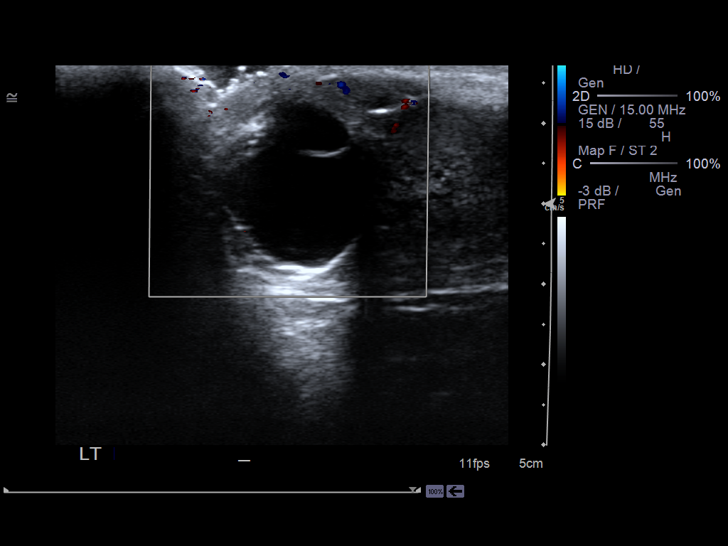
[im 56/56]
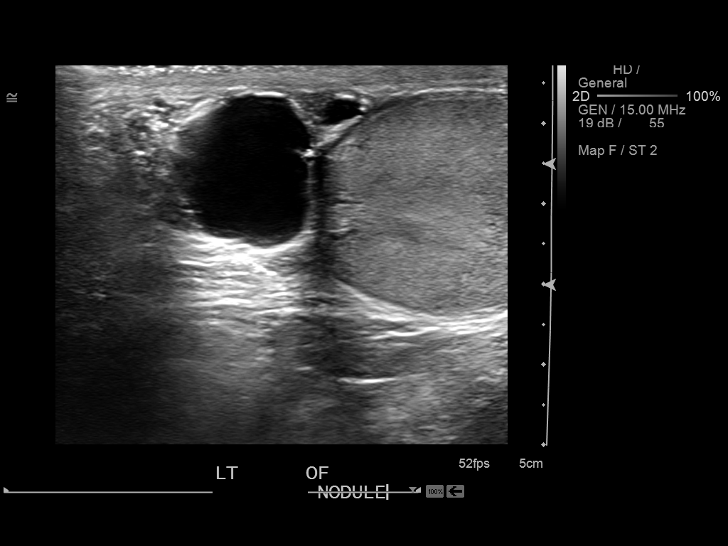

[14 of 25 positions shown; findings below may reference images not displayed]

FINDINGS: Right testicle

Measurements: 5.6 cm x 2.4 cm x 3.4 cm. No mass or microlithiasis
visualized.

Left testicle

Measurements: 4.4 cm x 2.7 cm x 3.4 cm. No mass or microlithiasis
visualized.

Right epididymis:  Normal in size and appearance.

Left epididymis: Multiple cysts versus a septated cyst enlarges the
epididymal head, the largest discrete cyst measuring 2.4 cm. This
may reflect a spermatocele, epididymal cysts or a combination.

Hydrocele:  None visualized.

Varicocele:  None visualized.

Pulsed Doppler interrogation of both testes demonstrates low
resistance arterial and venous waveforms bilaterally.
IMPRESSION: 1. The palpable abnormality is likely due to left epididymal cysts
or septated spermatocele, the largest discrete cyst measuring
cm.
2. No testicular mass.  No other abnormalities.

## 2015-05-06 ENCOUNTER — Ambulatory Visit (INDEPENDENT_AMBULATORY_CARE_PROVIDER_SITE_OTHER): Payer: Medicare Other

## 2015-05-06 DIAGNOSIS — Z23 Encounter for immunization: Secondary | ICD-10-CM | POA: Diagnosis not present

## 2015-06-20 ENCOUNTER — Other Ambulatory Visit: Payer: Self-pay | Admitting: Family Medicine

## 2015-06-20 DIAGNOSIS — I10 Essential (primary) hypertension: Secondary | ICD-10-CM

## 2015-07-18 ENCOUNTER — Other Ambulatory Visit: Payer: Self-pay | Admitting: Gastroenterology

## 2015-07-19 ENCOUNTER — Encounter: Payer: Self-pay | Admitting: Family Medicine

## 2015-07-19 ENCOUNTER — Ambulatory Visit (INDEPENDENT_AMBULATORY_CARE_PROVIDER_SITE_OTHER): Payer: Medicare Other | Admitting: Family Medicine

## 2015-07-19 VITALS — BP 138/72 | HR 68 | Temp 97.8°F | Resp 16 | Wt 245.0 lb

## 2015-07-19 DIAGNOSIS — E785 Hyperlipidemia, unspecified: Secondary | ICD-10-CM

## 2015-07-19 DIAGNOSIS — I10 Essential (primary) hypertension: Secondary | ICD-10-CM | POA: Diagnosis not present

## 2015-07-19 DIAGNOSIS — E119 Type 2 diabetes mellitus without complications: Secondary | ICD-10-CM

## 2015-07-19 LAB — POCT GLYCOSYLATED HEMOGLOBIN (HGB A1C)
ESTIMATED AVERAGE GLUCOSE: 148
Hemoglobin A1C: 6.8

## 2015-07-19 MED ORDER — METFORMIN HCL 1000 MG PO TABS: 1000.0000 mg | ORAL_TABLET | Freq: Two times a day (BID) | ORAL | Status: DC

## 2015-07-19 NOTE — Progress Notes (Signed)
Subjective:    Patient ID: Ivan Kennedy, male    DOB: 11/04/1945, 69 y.o.   MRN: [id number]  Diabetes He presents for his follow-up (Last OV 04/21/2015 and was 6.5%. Expecting  it to be up some with the holidays.  ) diabetic visit. There are no hypoglycemic associated symptoms. Pertinent negatives for hypoglycemia include no headaches or sweats. Pertinent negatives for diabetes include no blurred vision, no chest pain, no fatigue, no foot paresthesias, no foot ulcerations, no polydipsia, no polyphagia, no polyuria, no visual change, no weakness and no weight loss. There are no hypoglycemic complications. Current diabetic treatments: Metformin 1000mg , Glipizide 5mg . He is compliant with treatment all of the time. (FBS have been ranging from 130-150) An ACE inhibitor/angiotensin II receptor blocker is being taken. Eye exam is current [facility]; will be due for exam first of the year).  Hypertension This is a chronic problem. The problem is controlled. Pertinent negatives include no anxiety, blurred vision, chest pain, headaches, malaise/fatigue, neck pain, orthopnea, palpitations, peripheral edema, shortness of breath or sweats. Treatments tried: Hyzaar 100-12.5 mg, Metoprolol 25mg .  Hyperlipidemia This is a chronic problem. Recent lipid tests were reviewed and are normal (07/06/2014- Total- 115, Trig- 128, HDL- 38, LDL- 51). Pertinent negatives include no chest pain or shortness of breath. Current antihyperlipidemic treatment includes statins (Simvastatin 20 mg).      Review of Systems  Constitutional: Negative for weight loss, malaise/fatigue and fatigue.  Eyes: Negative for blurred vision.  Respiratory: Negative for shortness of breath.   Cardiovascular: Negative for chest pain, palpitations and orthopnea.  Endocrine: Negative for polydipsia, polyphagia and polyuria.  Musculoskeletal: Negative for neck pain.  Neurological: Negative for weakness and headaches.   BP 138/72 mmHg  Pulse  68  Temp(Src) 97.8 F (36.6 C) (Oral)  Resp 16  Wt 245 lb (111.131 kg)   Patient Active Problem List   Diagnosis Date Noted  . IBS (irritable bowel syndrome) 01/21/2015  . B12 deficiency 01/07/2015  . Adult BMI 30+ 01/07/2015  . Elevated CK 01/07/2015  . Calcium blood increased 01/07/2015  . Hyperlipidemia 01/07/2015  . Cannot sleep 01/07/2015  . Fatty liver disease, nonalcoholic [id number]  . Arthritis, degenerative 01/07/2015  . Acne erythematosa 01/07/2015  . Disorder of male genital organs 01/07/2015  . Atypical chest pain 10/09/2014  . Acid reflux 09/06/2009  . Diabetes mellitus, type 2 ([place]) 11/20/2008  . Colon, diverticulosis 11/20/2008  . BP (high blood pressure) 11/20/2008  . Apnea, sleep 11/20/2008  . History of artificial joint 11/20/2008   Past Medical History  Diagnosis Date  . Rosacea   . Diabetes mellitus ([place])   . Hyperlipidemia   . Sleep apnea   . Hypertension   . Colon polyp 2005   Current Outpatient Prescriptions on File Prior to Visit  Medication Sig  . aspirin 81 MG tablet Take 81 mg by mouth daily.  Marland Kitchen doxycycline (ORACEA) 40 MG capsule Take 40 mg by mouth every morning.  Marland Kitchen glipiZIDE (GLUCOTROL XL) 5 MG 24 hr tablet Take 1 tablet (5 mg total) by mouth 2 (two) times daily.  Marland Kitchen loratadine-pseudoephedrine (CLARITIN-D 24-HOUR) 10-240 MG per 24 hr tablet Take 1 tablet by mouth daily as needed for allergies.  Marland Kitchen losartan-hydrochlorothiazide (HYZAAR) 100-12.5 MG per tablet Take 1 tablet by mouth daily.  . metFORMIN (GLUCOPHAGE) 1000 MG tablet Take 1 tablet (1,000 mg total) by mouth 2 (two) times daily.  . metoprolol succinate (TOPROL-XL) 25 MG 24 hr tablet 1 TABLET ER 24HR, ORAL,  EVERY DAY  . metroNIDAZOLE (METROGEL) 1 % gel APPLY TO ROSACEA ONCE DAILY.  . simvastatin (ZOCOR) 20 MG tablet Take 1 tablet (20 mg total) by mouth every evening.  . vitamin B-12 (CYANOCOBALAMIN) 1000 MCG tablet Take 1,000 mcg by mouth daily.  . diphenoxylate-atropine (LOMOTIL)  2.5-0.025 MG per tablet Take 1 tablet by mouth 2 (two) times daily.  . fluorouracil (EFUDEX) 5 % cream APPLY TO AFFECTED AREA EVERY NIGHT FOR 6 WEEKS  . omeprazole (PRILOSEC) 40 MG capsule 1 CAPSULE DR, ORAL, DAILY (Patient not taking: Reported on 07/19/2015)   No current facility-administered medications on file prior to visit.   No Known Allergies Past Surgical History  Procedure Laterality Date  . Anal fissure repair  1998  . Shoulder spurs Right 2000  . Colonoscopy  2005, 2010  . 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 Earnestine Leys   Social History   Social History  . Marital Status: Married    Spouse Name: N/A  . Number of Children: N/A  . Years of Education: N/A   Occupational History  . Not on file.   Social History Main Topics  . Smoking status: Never Smoker   . Smokeless tobacco: Never Used  . Alcohol Use: 0.0 oz/week    0 Standard drinks or equivalent per week     Comment: occasionally  . Drug Use: No  . Sexual Activity: Not on file   Other Topics Concern  . Not on file   Social History Narrative   Family History  Problem Relation Age of Onset  . Diabetes Mother       Objective:   Physical Exam  Constitutional: He is oriented to person, place, and time. He appears well-developed and well-nourished.  Cardiovascular: Normal rate and regular rhythm.   Pulmonary/Chest: Effort normal and breath sounds normal.  Neurological: He is alert and oriented to person, place, and time.  Psychiatric: He has a normal mood and affect. His behavior is normal. Judgment and thought content normal.   BP 138/72 mmHg  Pulse 68  Temp(Src) 97.8 F (36.6 C) (Oral)  Resp 16  Wt 245 lb (111.131 kg)      Assessment & Plan:  1. Essential hypertension Condition is stable. Please continue current medication and  plan of care as noted.    2. Type 2 diabetes mellitus without complication, without long-term current use of insulin ([place]) Not as good as  Previous. Will work  on lifestyle changes and recheck in  3 months. Continue current medication for now.    - metFORMIN (GLUCOPHAGE) 1000 MG tablet; Take 1 tablet (1,000 mg total) by mouth 2 (two) times daily.  Dispense: 180 tablet; Refill: 3 - POCT glycosylated hemoglobin (Hb A1C) - Comprehensive metabolic panel Results for orders placed or performed in visit on 07/19/15  POCT glycosylated hemoglobin (Hb A1C)  Result Value Ref Range   Hemoglobin A1C 6.8    Est. average glucose Bld gHb Est-mCnc 148     3. Hyperlipidemia Condition is stable. Please continue current medication and  plan of care as noted.  Will check labs.   - Lipid panel - CBC with Differential/Platelet  Margarita Rana, MD

## 2015-07-20 ENCOUNTER — Telehealth: Payer: Self-pay

## 2015-07-20 LAB — CBC WITH DIFFERENTIAL/PLATELET
Basophils Absolute: 0 10*3/uL (ref 0.0–0.2)
Basos: 1 %
EOS (ABSOLUTE): 0.2 10*3/uL (ref 0.0–0.4)
Eos: 3 %
Hematocrit: 42.7 % (ref 37.5–51.0)
Hemoglobin: 14.7 g/dL (ref 12.6–17.7)
Immature Grans (Abs): 0 10*3/uL (ref 0.0–0.1)
Immature Granulocytes: 0 %
Lymphocytes Absolute: 1.4 10*3/uL (ref 0.7–3.1)
Lymphs: 23 %
MCH: 30.2 pg (ref 26.6–33.0)
MCHC: 34.4 g/dL (ref 31.5–35.7)
MCV: 88 fL (ref 79–97)
Monocytes Absolute: 0.5 10*3/uL (ref 0.1–0.9)
Monocytes: 8 %
Neutrophils Absolute: 3.8 10*3/uL (ref 1.4–7.0)
Neutrophils: 65 %
Platelets: 141 10*3/uL — ABNORMAL LOW (ref 150–379)
RBC: 4.86 x10E6/uL (ref 4.14–5.80)
RDW: 13.8 % (ref 12.3–15.4)
WBC: 5.8 10*3/uL (ref 3.4–10.8)

## 2015-07-20 LAB — COMPREHENSIVE METABOLIC PANEL
ALK PHOS: 62 IU/L (ref 39–117)
ALT: 37 IU/L (ref 0–44)
AST: 21 IU/L (ref 0–40)
Albumin/Globulin Ratio: 2.4 (ref 1.1–2.5)
Albumin: 4.3 g/dL (ref 3.6–4.8)
BUN/Creatinine Ratio: 15 (ref 10–22)
BUN: 16 mg/dL (ref 8–27)
Bilirubin Total: 0.9 mg/dL (ref 0.0–1.2)
CO2: 24 mmol/L (ref 18–29)
Calcium: 9.4 mg/dL (ref 8.6–10.2)
Chloride: 102 mmol/L (ref 97–106)
Creatinine, Ser: 1.08 mg/dL (ref 0.76–1.27)
GFR calc Af Amer: 82 mL/min/{1.73_m2} (ref >59)
GFR calc non Af Amer: 71 mL/min/{1.73_m2} (ref >59)
GLOBULIN, TOTAL: 1.8 g/dL (ref 1.5–4.5)
Glucose: 132 mg/dL — ABNORMAL HIGH (ref 65–99)
POTASSIUM: 4 mmol/L (ref 3.5–5.2)
SODIUM: 142 mmol/L (ref 136–144)
Total Protein: 6.1 g/dL (ref 6.0–8.5)

## 2015-07-20 LAB — LIPID PANEL
Chol/HDL Ratio: 3 ratio (ref 0.0–5.0)
Cholesterol, Total: 108 mg/dL (ref 100–199)
HDL: 36 mg/dL — ABNORMAL LOW
LDL Calculated: 49 mg/dL (ref 0–99)
Triglycerides: 115 mg/dL (ref 0–149)
VLDL Cholesterol Cal: 23 mg/dL (ref 5–40)

## 2015-07-20 NOTE — Telephone Encounter (Signed)
Pt advised as directed below.   Thanks,   -Laura  

## 2015-07-20 NOTE — Telephone Encounter (Signed)
-----   Message from Margarita Rana, MD sent at 07/20/2015  6:57 AM EST ----- Labs stable. Please notify patient. Thanks.

## 2015-07-22 ENCOUNTER — Ambulatory Visit: Payer: Medicare Other | Admitting: Family Medicine

## 2015-10-19 DIAGNOSIS — R269 Unspecified abnormalities of gait and mobility: Secondary | ICD-10-CM | POA: Diagnosis not present

## 2015-10-19 DIAGNOSIS — G4733 Obstructive sleep apnea (adult) (pediatric): Secondary | ICD-10-CM | POA: Diagnosis not present

## 2015-11-02 ENCOUNTER — Ambulatory Visit (INDEPENDENT_AMBULATORY_CARE_PROVIDER_SITE_OTHER): Payer: PPO | Admitting: Family Medicine

## 2015-11-02 ENCOUNTER — Encounter: Payer: Self-pay | Admitting: Family Medicine

## 2015-11-02 VITALS — BP 124/80 | HR 80 | Temp 97.9°F | Resp 16 | Wt 244.0 lb

## 2015-11-02 DIAGNOSIS — R369 Urethral discharge, unspecified: Secondary | ICD-10-CM | POA: Diagnosis not present

## 2015-11-02 DIAGNOSIS — E785 Hyperlipidemia, unspecified: Secondary | ICD-10-CM

## 2015-11-02 DIAGNOSIS — I10 Essential (primary) hypertension: Secondary | ICD-10-CM | POA: Diagnosis not present

## 2015-11-02 DIAGNOSIS — E119 Type 2 diabetes mellitus without complications: Secondary | ICD-10-CM

## 2015-11-02 DIAGNOSIS — G4730 Sleep apnea, unspecified: Secondary | ICD-10-CM

## 2015-11-02 LAB — POCT UA - MICROALBUMIN: Microalbumin Ur, POC: 50 mg/L

## 2015-11-02 LAB — POCT GLYCOSYLATED HEMOGLOBIN (HGB A1C)
Est. average glucose Bld gHb Est-mCnc: 154
HEMOGLOBIN A1C: 7

## 2015-11-02 MED ORDER — METOPROLOL SUCCINATE ER 25 MG PO TB24: ORAL_TABLET | ORAL | Status: DC

## 2015-11-02 MED ORDER — LOSARTAN POTASSIUM-HCTZ 100-12.5 MG PO TABS: 1.0000 | ORAL_TABLET | Freq: Every day | ORAL | Status: DC

## 2015-11-02 MED ORDER — SIMVASTATIN 20 MG PO TABS: 20.0000 mg | ORAL_TABLET | Freq: Every evening | ORAL | Status: DC

## 2015-11-02 NOTE — Progress Notes (Signed)
Patient ID: RC SCHIRALDI, male   DOB: 12/09/1945, 70 y.o.   MRN: [id number]       Patient: Ivan Kennedy Male    DOB: 1946-01-10   70 y.o.   MRN: [id number] Visit Date: 11/02/2015  Today's Provider: Margarita Rana, MD   Chief Complaint  Patient presents with  . Diabetes  . Hypertension   Subjective:    HPI  Diabetes Mellitus Type II, Follow-up:   Lab Results  Component Value Date   HGBA1C 7.0 11/02/2015   HGBA1C 6.8 07/19/2015   HGBA1C 6.5 04/21/2015    Last seen for diabetes 3 months ago.  Management since then includes no changes. He reports excellent compliance with treatment. He is not having side effects.  Current symptoms include none and have been stable. Home blood sugar records: fasting range: 120-145  Episodes of hypoglycemia? no   Current Insulin Regimen: none Most Recent Eye Exam: 10/2014 [facility] Weight trend: stable Prior visit with dietician: no Current diet: in general, a "healthy" diet   Current exercise: walking  Pertinent Labs:    Component Value Date/Time   CHOL 108 07/19/2015 0855   CHOL 115 07/06/2014   TRIG 115 07/19/2015 0855   HDL 36* 07/19/2015 0855   HDL 38 07/06/2014   LDLCALC 49 07/19/2015 0855   LDLCALC 51 07/06/2014   CREATININE 1.08 07/19/2015 0855   CREATININE 1.1 07/06/2014    Wt Readings from Last 3 Encounters:  11/02/15 244 lb (110.678 kg)  07/19/15 245 lb (111.131 kg)  04/21/15 241 lb (109.317 kg)   ------------------------------------------------------------------------    Hypertension, follow-up:  BP Readings from Last 3 Encounters:  11/02/15 124/80  07/19/15 138/72  04/21/15 128/76    He was last seen for hypertension 3 months ago.  BP at that visit was 138/72. Management changes since that visit include no changes. He reports excellent compliance with treatment. He is not having side effects.  He is exercising. He is not adherent to low salt diet.   Outside blood pressures are not being checked. He is  experiencing none.  Patient denies chest pain.   Cardiovascular risk factors include diabetes mellitus.  Use of agents associated with hypertension: none.   ------------------------------------------------------------------------  Abnormal penial discharge: Patient reports that he has been having brown colored discharge for about one year. Patient denies pain or worsening.    No Known Allergies Previous Medications   ASPIRIN 81 MG TABLET    Take 81 mg by mouth daily.   DIPHENOXYLATE-ATROPINE (LOMOTIL) 2.5-0.025 MG TABLET    TAKE 1 TABLET BY MOUTH TWICE A DAY   DOXYCYCLINE (ORACEA) 40 MG CAPSULE    Take 40 mg by mouth every morning.   GLIPIZIDE (GLUCOTROL XL) 5 MG 24 HR TABLET    Take 1 tablet (5 mg total) by mouth 2 (two) times daily.   LORATADINE-PSEUDOEPHEDRINE (CLARITIN-D 24-HOUR) 10-240 MG PER 24 HR TABLET    Take 1 tablet by mouth daily as needed for allergies.   LOSARTAN-HYDROCHLOROTHIAZIDE (HYZAAR) 100-12.5 MG PER TABLET    Take 1 tablet by mouth daily.   METFORMIN (GLUCOPHAGE) 1000 MG TABLET    Take 1 tablet (1,000 mg total) by mouth 2 (two) times daily.   METOPROLOL SUCCINATE (TOPROL-XL) 25 MG 24 HR TABLET    1 TABLET ER 24HR, ORAL, EVERY DAY   METRONIDAZOLE (METROGEL) 1 % GEL    APPLY TO ROSACEA ONCE DAILY.   OMEPRAZOLE (PRILOSEC) 40 MG CAPSULE    1 CAPSULE DR, ORAL, DAILY  SIMVASTATIN (ZOCOR) 20 MG TABLET    Take 1 tablet (20 mg total) by mouth every evening.   VITAMIN B-12 (CYANOCOBALAMIN) 1000 MCG TABLET    Take 1,000 mcg by mouth daily.    Review of Systems  Constitutional: Negative.   Cardiovascular: Negative.   Endocrine: Negative.     Social History  Substance Use Topics  . Smoking status: Never Smoker   . Smokeless tobacco: Never Used  . Alcohol Use: 0.0 oz/week    0 Standard drinks or equivalent per week     Comment: occasionally   Objective:   BP 124/80 mmHg  Pulse 80  Temp(Src) 97.9 F (36.6 C) (Oral)  Resp 16  Wt 244 lb (110.678 kg)  Physical Exam   Constitutional: He appears well-developed and well-nourished.  Cardiovascular: Normal rate, regular rhythm and normal heart sounds.   Pulmonary/Chest: Effort normal and breath sounds normal.  Psychiatric: He has a normal mood and affect. His behavior is normal. Judgment and thought content normal.        Assessment & Plan:     1. Type 2 diabetes mellitus without complication, without long-term current use of insulin ([place]) Not at goal. Patient advised to continue current medication and plan of care. Patient advised to work on diet and exercise. - POCT glycosylated hemoglobin (Hb A1C) - POCT UA - Micro albumin  Results for orders placed or performed in visit on 11/02/15  POCT glycosylated hemoglobin (Hb A1C)  Result Value Ref Range   Hemoglobin A1C 7.0    Est. average glucose Bld gHb Est-mCnc 154   POCT UA - Microalbumin  Result Value Ref Range   Microalbumin Ur, POC 50 mg/L   Diabetic Foot Exam - Simple   Simple Foot Form  Diabetic Foot exam was performed with the following findings:  Yes 11/02/2015 11:59 AM  Visual Inspection  No deformities, no ulcerations, no other skin breakdown bilaterally:  Yes  Sensation Testing  Intact to touch and monofilament testing bilaterally:  Yes  Pulse Check  Posterior Tibialis and Dorsalis pulse intact bilaterally:  Yes  Comments      2. Essential hypertension Stable. Patient advised to continue current medication and plan of care. - metoprolol succinate (TOPROL-XL) 25 MG 24 hr tablet; 1 TABLET ER 24HR, ORAL, EVERY DAY  Dispense: 90 tablet; Refill: 3 - losartan-hydrochlorothiazide (HYZAAR) 100-12.5 MG tablet; Take 1 tablet by mouth daily.  Dispense: 90 tablet; Refill: 3 - referral to [facility]. 3. Hyperlipidemia Stable. Patient  - simvastatin (ZOCOR) 20 MG tablet; Take 1 tablet (20 mg total) by mouth every evening.  Dispense: 90 tablet; Refill: 3  4. Abnormal penile discharge New problem. Worsening. Patient referred to  [facility]. - Ambulatory referral to Urology  5. Apnea, sleep Stable. Patient referred to have 2 week auto titration.       Patient seen and examined by Dr. Jerrell Belfast, and note scribed by Philbert Riser. Dimas, CMA.  I have reviewed the document for accuracy and completeness and I agree with above. - Jerrell Belfast, MD    Margarita Rana, MD  [facility]

## 2015-11-08 ENCOUNTER — Ambulatory Visit: Payer: PPO

## 2015-11-08 ENCOUNTER — Ambulatory Visit: Payer: Medicare Other | Admitting: Family Medicine

## 2015-11-09 ENCOUNTER — Encounter: Payer: Self-pay | Admitting: Urology

## 2015-11-09 ENCOUNTER — Ambulatory Visit (INDEPENDENT_AMBULATORY_CARE_PROVIDER_SITE_OTHER): Payer: PPO | Admitting: Urology

## 2015-11-09 VITALS — BP 126/75 | HR 63 | Ht 73.5 in | Wt 241.2 lb

## 2015-11-09 DIAGNOSIS — R369 Urethral discharge, unspecified: Secondary | ICD-10-CM | POA: Diagnosis not present

## 2015-11-09 LAB — URINALYSIS, COMPLETE
BILIRUBIN UA: NEGATIVE
GLUCOSE, UA: NEGATIVE
KETONES UA: NEGATIVE
Leukocytes, UA: NEGATIVE
NITRITE UA: NEGATIVE
PROTEIN UA: NEGATIVE
Specific Gravity, UA: 1.025 (ref 1.005–1.030)
UUROB: 0.2 mg/dL (ref 0.2–1.0)
pH, UA: 5.5 (ref 5.0–7.5)

## 2015-11-09 LAB — MICROSCOPIC EXAMINATION
Bacteria, UA: NONE SEEN
EPITHELIAL CELLS (NON RENAL): NONE SEEN /HPF (ref 0–10)

## 2015-11-09 MED ORDER — SILDENAFIL CITRATE 20 MG PO TABS: 20.0000 mg | ORAL_TABLET | Freq: Every day | ORAL | Status: DC | PRN

## 2015-11-09 NOTE — Progress Notes (Signed)
11/09/2015 11:14 AM   Ivan Kennedy 28-Apr-1946 [id number]  Referring provider: Margarita Rana, MD [address] [place], [place] [address]  Chief Complaint  Patient presents with  . Penile discharge    brownish color semen     HPI: 70 year old male who presents today for evaluation and further management of modest permeant. The patient states that for the past few months he has noted brown colored semen. He has not had any gross hematuria or grossly bloody ejaculate. He has had some nocturnal emissions that were brown with Black's Botts. He denies any prostate pain or tenderness. He denies any dysuria or progression of his voiding symptoms. He does have trouble with erections. He states that he rarely is able to obtain an erection and even more rare is his ability to maintain the erection to complete intercourse. He has not tried any aids for his erectile dysfunction including medications. The patient has a past medical history of diabetes but does not have a history of heart attack and does not take any nitrates for chest pain.     PMH: Past Medical History  Diagnosis Date  . Rosacea   . Diabetes mellitus ([place])   . Hyperlipidemia   . Sleep apnea   . Hypertension   . Colon polyp 2005    Surgical History: Past Surgical History  Procedure Laterality Date  . Anal fissure repair  1998  . Shoulder spurs Right 2000  . Colonoscopy  2005, 2010  . 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 Earnestine Leys    Home Medications:    Medication List       This list is accurate as of: 11/09/15 11:14 AM.  Always use your most recent med list.               aspirin EC 81 MG tablet  Take by mouth.     diphenoxylate-atropine 2.5-0.025 MG tablet  Commonly known as:  LOMOTIL  TAKE 1 TABLET BY MOUTH TWICE A DAY     doxycycline 100 MG capsule  Commonly known as:  VIBRAMYCIN  Take by mouth. Reported on 11/09/2015     doxycycline 40 MG capsule  Commonly known as:   ORACEA  Take 40 mg by mouth every morning.     glipiZIDE 5 MG 24 hr tablet  Commonly known as:  GLUCOTROL XL  Take 1 tablet (5 mg total) by mouth 2 (two) times daily.     loratadine-pseudoephedrine 10-240 MG 24 hr tablet  Commonly known as:  CLARITIN-D 24-hour  Take 1 tablet by mouth daily as needed for allergies.     losartan-hydrochlorothiazide 100-12.5 MG tablet  Commonly known as:  HYZAAR  Take 1 tablet by mouth daily.     metFORMIN 1000 MG tablet  Commonly known as:  GLUCOPHAGE  Take 1 tablet (1,000 mg total) by mouth 2 (two) times daily.     metoprolol succinate 25 MG 24 hr tablet  Commonly known as:  TOPROL-XL  1 TABLET ER 24HR, ORAL, EVERY DAY     metroNIDAZOLE 1 % gel  Commonly known as:  METROGEL  APPLY TO ROSACEA ONCE DAILY.     omeprazole 40 MG capsule  Commonly known as:  PRILOSEC  1 CAPSULE DR, ORAL, DAILY     polyethylene glycol powder powder  Commonly known as:  GLYCOLAX/MIRALAX     sildenafil 20 MG tablet  Commonly known as:  REVATIO  Take 1-5 tablets (20-100 mg total) by mouth daily  as needed.     simvastatin 20 MG tablet  Commonly known as:  ZOCOR  Take 1 tablet (20 mg total) by mouth every evening.     triazolam 0.125 MG tablet  Commonly known as:  HALCION  Take by mouth.     vitamin B-12 1000 MCG tablet  Commonly known as:  CYANOCOBALAMIN  Take 1,000 mcg by mouth daily.        Allergies: No Known Allergies  Family History: Family History  Problem Relation Age of Onset  . Diabetes Mother     Social History:  reports that he has never smoked. He has never used smokeless tobacco. He reports that he drinks alcohol. He reports that he does not use illicit drugs.  ROS: UROLOGY Frequent Urination?: No Hard to postpone urination?: No Burning/pain with urination?: No Get up at night to urinate?: No Leakage of urine?: No Urine stream starts and stops?: No Trouble starting stream?: No Do you have to strain to urinate?: No Blood in  urine?: No Urinary tract infection?: No Sexually transmitted disease?: No Injury to kidneys or bladder?: No Painful intercourse?: No Weak stream?: No Erection problems?: No Penile pain?: No  Gastrointestinal Nausea?: No Vomiting?: No Indigestion/heartburn?: No Diarrhea?: No Constipation?: No  Constitutional Fever: No Night sweats?: No Weight loss?: No Fatigue?: No  Skin Skin rash/lesions?: No Itching?: No  Eyes Blurred vision?: No Double vision?: No  Ears/Nose/Throat Sore throat?: No Sinus problems?: No  Hematologic/Lymphatic Swollen glands?: No Easy bruising?: No  Cardiovascular Leg swelling?: No Chest pain?: No  Respiratory Cough?: No Shortness of breath?: No  Endocrine Excessive thirst?: No  Musculoskeletal Back pain?: No Joint pain?: No  Neurological Headaches?: No Dizziness?: No  Psychologic Depression?: No Anxiety?: No  Physical Exam: BP 126/75 mmHg  Pulse 63  Ht 6' 1.5" (1.867 m)  Wt 109.408 kg (241 lb 3.2 oz)  BMI 31.39 kg/m2  Constitutional:  Alert and oriented, No acute distress. HEENT: [place] AT, moist mucus membranes.  Trachea midline, no masses. Cardiovascular: No clubbing, cyanosis, or edema. Respiratory: Normal respiratory effort, no increased work of breathing. GI: Abdomen is soft, nontender, nondistended, no abdominal masses GU: No CVA tenderness.  Rectal exam: Normal size, 40 g, prostate which is symmetric, no nodules, nontender Skin: No rashes, bruises or suspicious lesions. Lymph: No cervical or inguinal adenopathy. Neurologic: Grossly intact, no focal deficits, moving all 4 extremities. Psychiatric: Normal mood and affect.  Laboratory Data: Lab Results  Component Value Date   WBC 5.8 07/19/2015   HGB 15.1 07/27/2014   HCT 42.7 07/19/2015   MCV 88 07/19/2015   PLT 141* 07/19/2015    Lab Results  Component Value Date   CREATININE 1.08 07/19/2015    No results found for: PSA  No results found for:  TESTOSTERONE  Lab Results  Component Value Date   HGBA1C 7.0 11/02/2015    Urinalysis No results found for: COLORURINE, APPEARANCEUR, LABSPEC, PHURINE, GLUCOSEU, HGBUR, BILIRUBINUR, KETONESUR, PROTEINUR, UROBILINOGEN, NITRITE, LEUKOCYTESUR  Pertinent Imaging: None  Assessment & Plan:  The patient has a modest spermia and associated erectile dysfunction. I reassured the patient as relates to the medicine spermia and its benign nature. I explained to him that this is almost never a malignant process. I did also splayed and that can take several months to clear in his ejaculate dependent. The more ejecting efficiency has the sooner it will clear. I also counseled him on its recurrent nature. If it becomes a problem in the future we would consider  adding finasteride to his medication list however at this point I would not treat it. In addition, we discussed his erectile dysfunction and I recommended the patient consider sildenafil. I went over the appropriate use for sildenafil as well as a dose. I recommended that he started 40 mg dose and work his way up to 100 mg daily when necessary. We also discussed the side effects profile.  And finally, I recommended the patient have annual prostate cancer screening with a PSA which has not been done recently. As such, we've ordered that today.  1. Abnormal penile discharge -Sildenafil 20-100 mg, daily, when necessary, dispense #50, 5 refills - Urinalysis, Complete - PSA   Return in about 1 year (around 11/08/2016).  Ardis Hughs, [facility] [address] [place], [place] [address] [phone]

## 2015-11-10 LAB — PSA: Prostate Specific Ag, Serum: 2.2 ng/mL (ref 0.0–4.0)

## 2015-11-11 ENCOUNTER — Telehealth: Payer: Self-pay

## 2015-11-11 NOTE — Telephone Encounter (Signed)
-----   Message from Ardis Hughs, MD sent at 11/10/2015  8:44 PM EDT ----- Regarding: PSA results Please pass on to the patient that his PSA is normal.  Recheck in one year. ----- Message -----    From: Royanne Foots, CMA    Sent: 11/10/2015   8:59 AM      To: Ardis Hughs, MD    ----- Message -----    From: [facility] Lab Results In Interface    Sent: 11/09/2015  11:39 AM      To: [facility]

## 2015-11-11 NOTE — Telephone Encounter (Signed)
Spoke with pt in reference to PSA results. Pt voiced understanding.  

## 2015-12-06 DIAGNOSIS — E119 Type 2 diabetes mellitus without complications: Secondary | ICD-10-CM | POA: Diagnosis not present

## 2015-12-07 ENCOUNTER — Encounter: Payer: Self-pay | Admitting: Family Medicine

## 2015-12-21 ENCOUNTER — Other Ambulatory Visit: Payer: Self-pay | Admitting: Family Medicine

## 2015-12-21 DIAGNOSIS — E119 Type 2 diabetes mellitus without complications: Secondary | ICD-10-CM

## 2016-01-04 ENCOUNTER — Telehealth: Payer: Self-pay | Admitting: Family Medicine

## 2016-01-04 NOTE — Telephone Encounter (Signed)
Please call [place] and have respiratory therapist call me about his auto-titration. Thanks.

## 2016-01-04 NOTE — Telephone Encounter (Signed)
Will you please look at Navraj's CPap results (under media) and call [facility] to tell them what to titrate machine to please.

## 2016-02-02 ENCOUNTER — Other Ambulatory Visit: Payer: Self-pay | Admitting: Family Medicine

## 2016-02-02 DIAGNOSIS — K219 Gastro-esophageal reflux disease without esophagitis: Secondary | ICD-10-CM

## 2016-02-13 ENCOUNTER — Other Ambulatory Visit: Payer: Self-pay | Admitting: Gastroenterology

## 2016-02-17 ENCOUNTER — Other Ambulatory Visit: Payer: Self-pay

## 2016-02-17 MED ORDER — DIPHENOXYLATE-ATROPINE 2.5-0.025 MG PO TABS: 1.0000 | ORAL_TABLET | Freq: Two times a day (BID) | ORAL | Status: DC

## 2016-03-09 DIAGNOSIS — C44319 Basal cell carcinoma of skin of other parts of face: Secondary | ICD-10-CM | POA: Diagnosis not present

## 2016-03-09 DIAGNOSIS — L718 Other rosacea: Secondary | ICD-10-CM | POA: Diagnosis not present

## 2016-03-09 DIAGNOSIS — Z85828 Personal history of other malignant neoplasm of skin: Secondary | ICD-10-CM | POA: Diagnosis not present

## 2016-03-09 DIAGNOSIS — D485 Neoplasm of uncertain behavior of skin: Secondary | ICD-10-CM | POA: Diagnosis not present

## 2016-03-09 DIAGNOSIS — Z1283 Encounter for screening for malignant neoplasm of skin: Secondary | ICD-10-CM | POA: Diagnosis not present

## 2016-03-09 DIAGNOSIS — D225 Melanocytic nevi of trunk: Secondary | ICD-10-CM | POA: Diagnosis not present

## 2016-03-09 DIAGNOSIS — Z08 Encounter for follow-up examination after completed treatment for malignant neoplasm: Secondary | ICD-10-CM | POA: Diagnosis not present

## 2016-03-09 DIAGNOSIS — C44519 Basal cell carcinoma of skin of other part of trunk: Secondary | ICD-10-CM | POA: Diagnosis not present

## 2016-04-02 ENCOUNTER — Other Ambulatory Visit: Payer: Self-pay | Admitting: Family Medicine

## 2016-04-02 DIAGNOSIS — E119 Type 2 diabetes mellitus without complications: Secondary | ICD-10-CM

## 2016-04-04 ENCOUNTER — Encounter: Payer: Self-pay | Admitting: Physician Assistant

## 2016-04-04 ENCOUNTER — Ambulatory Visit (INDEPENDENT_AMBULATORY_CARE_PROVIDER_SITE_OTHER): Payer: PPO | Admitting: Physician Assistant

## 2016-04-04 ENCOUNTER — Ambulatory Visit: Payer: PPO | Admitting: Family Medicine

## 2016-04-04 VITALS — BP 118/78 | HR 64 | Temp 97.7°F | Resp 16 | Wt 240.4 lb

## 2016-04-04 DIAGNOSIS — E119 Type 2 diabetes mellitus without complications: Secondary | ICD-10-CM

## 2016-04-04 DIAGNOSIS — I10 Essential (primary) hypertension: Secondary | ICD-10-CM | POA: Diagnosis not present

## 2016-04-04 DIAGNOSIS — E785 Hyperlipidemia, unspecified: Secondary | ICD-10-CM

## 2016-04-04 MED ORDER — GLIPIZIDE ER 5 MG PO TB24: 5.0000 mg | ORAL_TABLET | Freq: Two times a day (BID) | ORAL | 1 refills | Status: DC

## 2016-04-04 MED ORDER — METFORMIN HCL 1000 MG PO TABS: 1000.0000 mg | ORAL_TABLET | Freq: Two times a day (BID) | ORAL | 1 refills | Status: DC

## 2016-04-04 MED ORDER — LOSARTAN POTASSIUM-HCTZ 100-12.5 MG PO TABS: 1.0000 | ORAL_TABLET | Freq: Every day | ORAL | 1 refills | Status: DC

## 2016-04-04 MED ORDER — DOXYCYCLINE MONOHYDRATE 50 MG PO TABS: 50.0000 mg | ORAL_TABLET | Freq: Every day | ORAL | 1 refills | Status: DC

## 2016-04-04 NOTE — Progress Notes (Signed)
Patient: Ivan Kennedy Male    DOB: 01-03-1946   70 y.o.   MRN: [id number] Visit Date: 04/04/2016  Today's Provider: Mar Daring, PA-C   Chief Complaint  Patient presents with  . Diabetes  . Hypertension  . Follow-up  . Hyperlipidemia   Subjective:    HPI  Diabetes Mellitus Type II, Follow-up:   Lab Results  Component Value Date   HGBA1C 7.0 11/02/2015   HGBA1C 6.8 07/19/2015   HGBA1C 6.5 04/21/2015   Last seen for diabetes 5 months ago.  Management since then includes continue Metformin and Glipizide He reports good compliance with treatment. He is not having side effects.  Current symptoms include none and have been stable. Home blood sugar records: are being checked average 140-150's  Episodes of hypoglycemia? no   Current Insulin Regimen: none Weight trend: stable Current diet: well balanced Current exercise: lightly  ------------------------------------------------------------------------   Hypertension, follow-up:  BP Readings from Last 3 Encounters:  04/04/16 118/78  11/09/15 126/75  11/02/15 124/80    He was last seen for hypertension 5 months ago.  BP at that visit was 126/75. Management since that visit includes continue Metoprolol and Hyzaar.He reports good compliance with treatment. He is not having side effects.  He is exercising. He is not adherent to low salt diet.   Outside blood pressures are not being checked. He is experiencing none.  Patient denies none.   Cardiovascular risk factors include advanced age (older than 74 for men, 45 for women), diabetes mellitus, dyslipidemia and male gender.  Use of agents associated with hypertension: none.  ------------------------------------------------------------------------    Lipid/Cholesterol, Follow-up:   Last seen for this 5 months ago.  Management since that visit includes continue Simvastatin.  Last Lipid Panel:    Component Value Date/Time   CHOL 108 07/19/2015 0855   TRIG 115 07/19/2015 0855   HDL 36 (L) 07/19/2015 0855   CHOLHDL 3.0 07/19/2015 0855   LDLCALC 49 07/19/2015 0855    He reports good compliance with treatment. He is not having side effects.   Wt Readings from Last 3 Encounters:  04/04/16 240 lb 6.4 oz (109 kg)  11/09/15 241 lb 3.2 oz (109.4 kg)  11/02/15 244 lb (110.7 kg)    ------------------------------------------------------------------------        Previous Medications   ASPIRIN EC 81 MG TABLET    Take by mouth.   DIPHENOXYLATE-ATROPINE (LOMOTIL) 2.5-0.025 MG TABLET    Take 1 tablet by mouth 2 (two) times daily.   LORATADINE-PSEUDOEPHEDRINE (CLARITIN-D 24-HOUR) 10-240 MG PER 24 HR TABLET    Take 1 tablet by mouth daily as needed for allergies.   METOPROLOL SUCCINATE (TOPROL-XL) 25 MG 24 HR TABLET    1 TABLET ER 24HR, ORAL, EVERY DAY   METRONIDAZOLE (METROGEL) 1 % GEL    APPLY TO ROSACEA ONCE DAILY.   OMEPRAZOLE (PRILOSEC) 40 MG CAPSULE    TAKE ONE CAPSULE BY MOUTH DAILY   ONE TOUCH ULTRA TEST TEST STRIP    USE AS DIRECTED TO TEST ONCE DAILY   POLYETHYLENE GLYCOL POWDER (GLYCOLAX/MIRALAX) POWDER       SIMVASTATIN (ZOCOR) 20 MG TABLET    Take 1 tablet (20 mg total) by mouth every evening.   VITAMIN B-12 (CYANOCOBALAMIN) 1000 MCG TABLET    Take 1,000 mcg by mouth daily.    Review of Systems  Constitutional: Negative.   Respiratory: Negative.   Cardiovascular: Negative.   Endocrine: Negative.   Neurological: Negative.     Social  History  Substance Use Topics  . Smoking status: Never Smoker  . Smokeless tobacco: Never Used  . Alcohol use 0.0 oz/week     Comment: occasionally   Objective:   BP 118/78 (BP Location: Right Arm, Patient Position: Sitting, Cuff Size: Large)   Pulse 64   Temp 97.7 F (36.5 C) (Oral)   Resp 16   Wt 240 lb 6.4 oz (109 kg)   BMI 31.29 kg/m   Physical Exam  Constitutional: He appears well-developed and well-nourished. No distress.  HENT:  Head: Normocephalic and atraumatic.  Neck:  Normal range of motion. Neck supple. No JVD present. No tracheal deviation present. No thyromegaly present.  Cardiovascular: Normal rate, regular rhythm and normal heart sounds.  Exam reveals no gallop and no friction rub.   No murmur heard. Pulmonary/Chest: Effort normal and breath sounds normal. No respiratory distress. He has no wheezes. He has no rales.  Musculoskeletal: He exhibits no edema.  Lymphadenopathy:    He has no cervical adenopathy.  Skin: He is not diaphoretic.  Vitals reviewed.     Assessment & Plan:     1. Type 2 diabetes mellitus without complication, without long-term current use of insulin (HCC) Patient reports fasting sugars have been fluctuating more recently. Will check A1c and see if it increased again. Diagnosis pulled for medication refill. Continue current medical treatment plan for now. May adjust pending results. Will check labs as below and f/u pending results. I will see him back in 3 months to recheck A1c.  - glipiZIDE (GLUCOTROL XL) 5 MG 24 hr tablet; Take 1 tablet (5 mg total) by mouth 2 (two) times daily.  Dispense: 180 tablet; Refill: 1 - metFORMIN (GLUCOPHAGE) 1000 MG tablet; Take 1 tablet (1,000 mg total) by mouth 2 (two) times daily.  Dispense: 180 tablet; Refill: 1 - HgB A1c  2. Essential hypertension Stable. Diagnosis pulled for medication refill. Continue current medical treatment plan. Will check labs as below and f/u pending results. - losartan-hydrochlorothiazide (HYZAAR) 100-12.5 MG tablet; Take 1 tablet by mouth daily.  Dispense: 90 tablet; Refill: 1 - CBC with Differential - Basic Metabolic Panel (BMET)  3. Hyperlipidemia Stable. Continue current medical treatment plan. Will check labs as below and f/u pending results. - Lipid Profile   Follow up: Return in about 3 months (around 07/05/2016) for HgBA1c.     The entirety of the information documented in the History of Present Illness, Review of Systems and Physical Exam were personally  obtained by me. Portions of this information were initially documented by Charolett Bumpers, CMA and reviewed by me for thoroughness and accuracy.

## 2016-04-04 NOTE — Patient Instructions (Signed)

## 2016-04-05 ENCOUNTER — Telehealth: Payer: Self-pay

## 2016-04-05 DIAGNOSIS — C44319 Basal cell carcinoma of skin of other parts of face: Secondary | ICD-10-CM | POA: Diagnosis not present

## 2016-04-05 LAB — CBC WITH DIFFERENTIAL/PLATELET
BASOS ABS: 0 10*3/uL (ref 0.0–0.2)
BASOS: 1 %
EOS (ABSOLUTE): 0.2 10*3/uL (ref 0.0–0.4)
Eos: 2 %
Hematocrit: 41.9 % (ref 37.5–51.0)
Hemoglobin: 14.7 g/dL (ref 12.6–17.7)
Immature Grans (Abs): 0 10*3/uL (ref 0.0–0.1)
Immature Granulocytes: 0 %
LYMPHS: 30 %
Lymphocytes Absolute: 1.9 10*3/uL (ref 0.7–3.1)
MCH: 30.6 pg (ref 26.6–33.0)
MCHC: 35.1 g/dL (ref 31.5–35.7)
MCV: 87 fL (ref 79–97)
MONOS ABS: 0.5 10*3/uL (ref 0.1–0.9)
Monocytes: 8 %
NEUTROS ABS: 3.7 10*3/uL (ref 1.4–7.0)
Neutrophils: 59 %
PLATELETS: 158 10*3/uL (ref 150–379)
RBC: 4.81 x10E6/uL (ref 4.14–5.80)
RDW: 13.2 % (ref 12.3–15.4)
WBC: 6.3 10*3/uL (ref 3.4–10.8)

## 2016-04-05 LAB — LIPID PANEL
CHOLESTEROL TOTAL: 102 mg/dL (ref 100–199)
Chol/HDL Ratio: 2.7 ratio units (ref 0.0–5.0)
HDL: 38 mg/dL — ABNORMAL LOW (ref >39)
LDL Calculated: 41 mg/dL (ref 0–99)
Triglycerides: 115 mg/dL (ref 0–149)
VLDL CHOLESTEROL CAL: 23 mg/dL (ref 5–40)

## 2016-04-05 LAB — HEMOGLOBIN A1C
ESTIMATED AVERAGE GLUCOSE: 160 mg/dL
HEMOGLOBIN A1C: 7.2 % — AB (ref 4.8–5.6)

## 2016-04-05 LAB — BASIC METABOLIC PANEL
BUN / CREAT RATIO: 13 (ref 10–24)
BUN: 16 mg/dL (ref 8–27)
CHLORIDE: 100 mmol/L (ref 96–106)
CO2: 23 mmol/L (ref 18–29)
Calcium: 10.1 mg/dL (ref 8.6–10.2)
Creatinine, Ser: 1.23 mg/dL (ref 0.76–1.27)
GFR calc non Af Amer: 60 mL/min/{1.73_m2} (ref >59)
GFR, EST AFRICAN AMERICAN: 69 mL/min/{1.73_m2} (ref >59)
GLUCOSE: 146 mg/dL — AB (ref 65–99)
POTASSIUM: 3.8 mmol/L (ref 3.5–5.2)
SODIUM: 142 mmol/L (ref 134–144)

## 2016-04-05 NOTE — Telephone Encounter (Signed)
LMTCB

## 2016-04-05 NOTE — Telephone Encounter (Signed)
-----   Message from Mar Daring, [place] sent at 04/05/2016  1:21 PM EDT ----- All labs are within normal limits and stable with exception of HgBA1c which has increased to 7.2 from 7.0. We can change glipizide dosing so you take one before each meal, add a third agent or you focus really hard on dieting and limiting carbs and sugars. We can recheck in the office in 3 months. If still increasing will definitely need to change at that time.  Thanks! -JB

## 2016-04-05 NOTE — Telephone Encounter (Signed)
Patient advised as directed below. Patient prefers to work on lifestyles changes and recheck in 3 months.

## 2016-04-11 DIAGNOSIS — G4733 Obstructive sleep apnea (adult) (pediatric): Secondary | ICD-10-CM | POA: Diagnosis not present

## 2016-04-24 DIAGNOSIS — L821 Other seborrheic keratosis: Secondary | ICD-10-CM | POA: Diagnosis not present

## 2016-04-24 DIAGNOSIS — C44519 Basal cell carcinoma of skin of other part of trunk: Secondary | ICD-10-CM | POA: Diagnosis not present

## 2016-06-08 ENCOUNTER — Encounter: Payer: Self-pay | Admitting: Physician Assistant

## 2016-06-08 ENCOUNTER — Ambulatory Visit (INDEPENDENT_AMBULATORY_CARE_PROVIDER_SITE_OTHER): Payer: PPO | Admitting: Physician Assistant

## 2016-06-08 VITALS — BP 140/78 | HR 64 | Temp 97.7°F | Resp 16 | Ht 73.0 in | Wt 248.0 lb

## 2016-06-08 DIAGNOSIS — R109 Unspecified abdominal pain: Secondary | ICD-10-CM

## 2016-06-08 DIAGNOSIS — M62830 Muscle spasm of back: Secondary | ICD-10-CM

## 2016-06-08 LAB — POCT URINALYSIS DIPSTICK
Bilirubin, UA: NEGATIVE
Glucose, UA: NEGATIVE
KETONES UA: NEGATIVE
Leukocytes, UA: NEGATIVE
Nitrite, UA: NEGATIVE
PH UA: 7.5
PROTEIN UA: NEGATIVE
RBC UA: NEGATIVE
SPEC GRAV UA: 1.01
UROBILINOGEN UA: 0.2

## 2016-06-08 MED ORDER — TIZANIDINE HCL 4 MG PO TABS: 4.0000 mg | ORAL_TABLET | Freq: Three times a day (TID) | ORAL | 0 refills | Status: DC | PRN

## 2016-06-08 NOTE — Addendum Note (Signed)
Addended by: Mar Daring on: 06/08/2016 09:08 AM   Modules accepted: Orders

## 2016-06-08 NOTE — Patient Instructions (Signed)

## 2016-06-08 NOTE — Progress Notes (Addendum)
Patient: Ivan Kennedy Male    DOB: 05/23/1946   70 y.o.   MRN: [id number] Visit Date: 06/08/2016  Today's Provider: Mar Daring, PA-C   Chief Complaint  Patient presents with  . Urinary Tract Infection   Subjective:    HPI Urinary Tract Infection: Patient complains of bilateral flank pain He has had symptoms for 1 week. Patient also complains of back pain. Patient denies fever. Patient does have a history of recurrent UTI.  Patient does not have a history of pyelonephritis. Denies penile discharge, rectal pain, dysuria, hematuria.   Patient is having pain over posterior flanks bilaterally and "feels a bulge". He does do a lot of heavy lifting during the day (feed bags, fertilizer, etc). No decreased ROM. No radiating symptoms.     No Known Allergies   Current Outpatient Prescriptions:  .  aspirin EC 81 MG tablet, Take by mouth., Disp: , Rfl:  .  diphenoxylate-atropine (LOMOTIL) 2.5-0.025 MG tablet, Take 1 tablet by mouth 2 (two) times daily., Disp: 60 tablet, Rfl: 3 .  doxycycline (ADOXA) 50 MG tablet, Take 1 tablet (50 mg total) by mouth daily., Disp: 90 tablet, Rfl: 1 .  glipiZIDE (GLUCOTROL XL) 5 MG 24 hr tablet, Take 1 tablet (5 mg total) by mouth 2 (two) times daily., Disp: 180 tablet, Rfl: 1 .  loratadine-pseudoephedrine (CLARITIN-D 24-HOUR) 10-240 MG per 24 hr tablet, Take 1 tablet by mouth daily as needed for allergies., Disp: , Rfl:  .  losartan-hydrochlorothiazide (HYZAAR) 100-12.5 MG tablet, Take 1 tablet by mouth daily., Disp: 90 tablet, Rfl: 1 .  metFORMIN (GLUCOPHAGE) 1000 MG tablet, Take 1 tablet (1,000 mg total) by mouth 2 (two) times daily., Disp: 180 tablet, Rfl: 1 .  metoprolol succinate (TOPROL-XL) 25 MG 24 hr tablet, 1 TABLET ER 24HR, ORAL, EVERY DAY, Disp: 90 tablet, Rfl: 3 .  metroNIDAZOLE (METROGEL) 1 % gel, APPLY TO ROSACEA ONCE DAILY., Disp: , Rfl: 5 .  omeprazole (PRILOSEC) 40 MG capsule, TAKE ONE CAPSULE BY MOUTH DAILY, Disp: 90 capsule,  Rfl: 3 .  ONE TOUCH ULTRA TEST test strip, USE AS DIRECTED TO TEST ONCE DAILY, Disp: 100 each, Rfl: 1 .  polyethylene glycol powder (GLYCOLAX/MIRALAX) powder, , Disp: , Rfl:  .  simvastatin (ZOCOR) 20 MG tablet, Take 1 tablet (20 mg total) by mouth every evening., Disp: 90 tablet, Rfl: 3 .  vitamin B-12 (CYANOCOBALAMIN) 1000 MCG tablet, Take 1,000 mcg by mouth daily., Disp: , Rfl:   Review of Systems  Constitutional: Negative.   Respiratory: Negative.   Cardiovascular: Negative.   Gastrointestinal: Negative.   Genitourinary: Positive for flank pain. Negative for decreased urine volume, difficulty urinating, discharge, dysuria, hematuria, penile pain, penile swelling, scrotal swelling, testicular pain and urgency.  Musculoskeletal: Positive for back pain.  Neurological: Negative.     Social History  Substance Use Topics  . Smoking status: Never Smoker  . Smokeless tobacco: Never Used  . Alcohol use 0.0 oz/week     Comment: occasionally   Objective:   BP 140/78 (BP Location: Left Arm, Patient Position: Sitting, Cuff Size: Large)   Pulse 64   Temp 97.7 F (36.5 C) (Oral)   Resp 16   Ht 6\' 1"  (1.854 m)   Wt 248 lb (112.5 kg)   BMI 32.72 kg/m   Physical Exam  Constitutional: He is oriented to person, place, and time. He appears well-developed and well-nourished. No distress.  Cardiovascular: Normal rate, regular rhythm and normal heart  sounds.  Exam reveals no gallop and no friction rub.   No murmur heard. Pulmonary/Chest: Effort normal and breath sounds normal. No respiratory distress. He has no wheezes. He has no rales.  Abdominal: Soft. Normal appearance and bowel sounds are normal. He exhibits no distension and no mass. There is no hepatosplenomegaly. There is no tenderness. There is no rebound, no guarding and no CVA tenderness.  Musculoskeletal:       Thoracic back: He exhibits tenderness and spasm. He exhibits normal range of motion and no bony tenderness.       Lumbar  back: He exhibits tenderness and spasm. He exhibits normal range of motion, no bony tenderness and no swelling.       Back:  Neurological: He is alert and oriented to person, place, and time.  Skin: Skin is warm and dry. He is not diaphoretic.  Vitals reviewed.       Assessment & Plan:     1. Muscle spasm of back Advised to try not to lift anything greater than 10 pounds without assistance for the next 2 weeks, apply heating pad to the area. Use aleve 2 tabs PO BID prn x 1-2 weeks. Tizanidine 4mg  given as below for spasm. May cut in half if needed. Warned of drowsiness precautions and to not drive while taking muscle relaxer. Call if symptoms worsen or fail to improve in two weeks.  - tiZANidine (ZANAFLEX) 4 MG tablet; Take 1 tablet (4 mg total) by mouth every 8 (eight) hours as needed for muscle spasms.  Dispense: 30 tablet; Refill: 0  2. Flank pain UA normal. - POCT Urinalysis Dipstick      Mar Daring, PA-C  [facility]

## 2016-07-11 ENCOUNTER — Encounter: Payer: Self-pay | Admitting: Physician Assistant

## 2016-07-11 ENCOUNTER — Ambulatory Visit (INDEPENDENT_AMBULATORY_CARE_PROVIDER_SITE_OTHER): Payer: PPO | Admitting: Physician Assistant

## 2016-07-11 VITALS — BP 132/76 | HR 64 | Temp 97.6°F | Resp 16 | Ht 73.0 in | Wt 241.0 lb

## 2016-07-11 DIAGNOSIS — E785 Hyperlipidemia, unspecified: Secondary | ICD-10-CM | POA: Diagnosis not present

## 2016-07-11 DIAGNOSIS — Z23 Encounter for immunization: Secondary | ICD-10-CM

## 2016-07-11 DIAGNOSIS — I10 Essential (primary) hypertension: Secondary | ICD-10-CM

## 2016-07-11 DIAGNOSIS — E119 Type 2 diabetes mellitus without complications: Secondary | ICD-10-CM

## 2016-07-11 DIAGNOSIS — K580 Irritable bowel syndrome with diarrhea: Secondary | ICD-10-CM | POA: Diagnosis not present

## 2016-07-11 LAB — POCT GLYCOSYLATED HEMOGLOBIN (HGB A1C)
ESTIMATED AVERAGE GLUCOSE: 143
HEMOGLOBIN A1C: 6.6

## 2016-07-11 MED ORDER — METFORMIN HCL 1000 MG PO TABS: 1000.0000 mg | ORAL_TABLET | Freq: Two times a day (BID) | ORAL | 1 refills | Status: DC

## 2016-07-11 MED ORDER — GLIPIZIDE ER 5 MG PO TB24: 5.0000 mg | ORAL_TABLET | Freq: Two times a day (BID) | ORAL | 1 refills | Status: DC

## 2016-07-11 MED ORDER — DIPHENOXYLATE-ATROPINE 2.5-0.025 MG PO TABS: 1.0000 | ORAL_TABLET | Freq: Two times a day (BID) | ORAL | 5 refills | Status: DC

## 2016-07-11 NOTE — Patient Instructions (Signed)
Type 2 Diabetes Mellitus, Self Care, Adult °Caring for yourself after you have been diagnosed with type 2 diabetes (type 2 diabetes mellitus) means keeping your blood sugar (glucose) under control with a balance of: °· Nutrition. °· Exercise. °· Lifestyle changes. °· Medicines or insulin, if necessary. °· Support from your team of health care providers and others. ° °The following information explains what you need to know to manage your diabetes at home. °What do I need to do to manage my blood glucose? °· Check your blood glucose every day, as often as told by your health care provider. °· Contact your health care provider if your blood glucose is above your target for 2 tests in a row. °· Have your A1c (hemoglobin A1c) level checked at least two times a year, or as often as told by your health care provider. °Your health care provider will set individualized treatment goals for you. Generally, the goal of treatment is to maintain the following blood glucose levels: °· Before meals (preprandial): 80-130 mg/dL (4.4-7.2 mmol/L). °· After meals (postprandial): below 180 mg/dL (10 mmol/L). °· A1c level: less than 7%. ° °What do I need to know about hyperglycemia and hypoglycemia? °What is hyperglycemia? °Hyperglycemia, also called high blood glucose, occurs when blood glucose is too high. Make sure you know the early signs of hyperglycemia, such as: °· Increased thirst. °· Hunger. °· Feeling very tired. °· Needing to urinate more often than usual. °· Blurry vision. ° °What is hypoglycemia? °Hypoglycemia, also called low blood glucose, occurs with a blood glucose level at or below 70 mg/dL (3.9 mmol/L). The risk for hypoglycemia increases during or after exercise, during sleep, during illness, and when skipping meals or not eating for a long time (fasting). °It is important to know the symptoms of hypoglycemia and treat it right away. Always have a 15-gram rapid-acting carbohydrate snack with you to treat low blood  glucose. Family members and close friends should also know the symptoms and should understand how to treat hypoglycemia, in case you are not able to treat yourself. °What are the symptoms of hypoglycemia? °Hypoglycemia symptoms can include: °· Hunger. °· Anxiety. °· Sweating and feeling clammy. °· Confusion. °· Dizziness or feeling light-headed. °· Sleepiness. °· Nausea. °· Increased heart rate. °· Headache. °· Blurry vision. °· Seizure. °· Nightmares. °· Tingling or numbness around the mouth, lips, or tongue. °· A change in speech. °· Decreased ability to concentrate. °· A change in coordination. °· Restless sleep. °· Tremors or shakes. °· Fainting. °· Irritability. ° °How do I treat hypoglycemia? ° °If you are alert and able to swallow safely, follow the 15:15 rule: °· Take 15 grams of a rapid-acting carbohydrate. Rapid-acting options include: °? 1 tube of glucose gel. °? 3 glucose pills. °? 6-8 pieces of hard candy. °? 4 oz (120 mL) of fruit juice. °? 4 oz (120 mL) of regular (not diet) soda. °· Check your blood glucose 15 minutes after you take the carbohydrate. °· If the repeat blood glucose level is still at or below 70 mg/dL (3.9 mmol/L), take 15 grams of a carbohydrate again. °· If your blood glucose level does not increase above 70 mg/dL (3.9 mmol/L) after 3 tries, seek emergency medical care. °· After your blood glucose level returns to normal, eat a meal or a snack within 1 hour. ° °How do I treat severe hypoglycemia? °Severe hypoglycemia is when your blood glucose level is at or below 54 mg/dL (3 mmol/L). Severe hypoglycemia is an emergency. Do not   wait to see if the symptoms will go away. Get medical help right away. Call your local emergency services (911 in the [place] Do not drive yourself to the hospital. °If you have severe hypoglycemia and you cannot eat or drink, you may need an injection of glucagon. A family member or close friend should learn how to check your blood glucose and how to give you  a glucagon injection. Ask your health care provider if you need to have an emergency glucagon injection kit available. °Severe hypoglycemia may need to be treated in a hospital. The treatment may include getting glucose through an IV tube. You may also need treatment for the cause of your hypoglycemia. °Can having diabetes put me at risk for other conditions? °Having diabetes can put you at risk for other long-term (chronic) conditions, such as heart disease and kidney disease. Your health care provider may prescribe medicines to help prevent complications from diabetes. These medicines may include: °· Aspirin. °· Medicine to lower cholesterol. °· Medicine to control blood pressure. ° °What else can I do to manage my diabetes? °Take your diabetes medicines as told °· If your health care provider prescribed insulin or diabetes medicines, take them every day. °· Do not run out of insulin or other diabetes medicines that you take. Plan ahead so you always have these available. °· If you use insulin, adjust your dosage based on how physically active you are and what foods you eat. Your health care provider will tell you how to adjust your dosage. °Make healthy food choices ° °The things that you eat and drink affect your blood glucose and your insulin dosage. Making good choices helps to control your diabetes and prevent other health problems. A healthy meal plan includes eating lean proteins, complex carbohydrates, fresh fruits and vegetables, low-fat dairy products, and healthy fats. °Make an appointment to see a diet and nutrition specialist (registered dietitian) to help you create an eating plan that is right for you. Make sure that you: °· Follow instructions from your health care provider about eating or drinking restrictions. °· Drink enough fluid to keep your urine clear or pale yellow. °· Eat healthy snacks between nutritious meals. °· Track the carbohydrates that you eat. Do this by reading food labels and  learning the standard serving sizes of foods. °· Follow your sick day plan whenever you cannot eat or drink as usual. Make this plan in advance with your health care provider. ° °Stay active ° °Exercise regularly, as told by your health care provider. This may include: °· Stretching and doing strength exercises, such as yoga or weightlifting, at least 2 times a week. °· Doing at least 150 minutes of moderate-intensity or vigorous-intensity exercise each week. This could be brisk walking, biking, or water aerobics. °? Spread out your activity over at least 3 days of the week. °? Do not go more than 2 days in a row without doing some kind of physical activity. ° °When you start a new exercise or activity, work with your health care provider to adjust your insulin, medicines, or food intake as needed. °Make healthy lifestyle choices °· Do not use any tobacco products, such as cigarettes, chewing tobacco, and e-cigarettes. If you need help quitting, ask your health care provider. °· If your health care provider says that alcohol is safe for you, limit alcohol intake to no more than 1 drink per day for nonpregnant women and 2 drinks per day for men. One drink equals 12 oz of   beer, 5 oz of wine, or 1½ oz of hard liquor. °· Learn to manage stress. If you need help with this, ask your health care provider. °Care for your body ° °· Keep your immunizations up to date. In addition to getting vaccinations as told by your health care provider, it is recommended that you get vaccinated against the following illnesses: °? The flu (influenza). Get a flu shot every year. °? Pneumonia. °? Hepatitis B. °· Schedule an eye exam soon after your diagnosis, and then one time every year after that. °· Check your skin and feet every day for cuts, bruises, redness, blisters, or sores. Schedule a foot exam with your health care provider once every year. °· Brush your teeth and gums two times a day, and floss at least one time a day. Visit your  dentist at least once every 6 months. °· Maintain a healthy weight. °General instructions °· Take over-the-counter and prescription medicines only as told by your health care provider. °· Share your diabetes management plan with people in your workplace, school, and household. °· Check your urine for ketones when you are ill and as told by your health care provider. °· Ask your health care provider: °? Do I need to meet with a diabetes educator? °? Where can I find a support group for people with diabetes? °· Carry a medical alert card or wear medical alert jewelry. °· Keep all follow-up visits as told by your health care provider. This is important. °Where to find more information: °For more information about diabetes, visit: °· American Diabetes Association (ADA): www.diabetes.org °· American Association of Diabetes Educators (AADE): www.diabeteseducator.org/patient-resources ° °This information is not intended to replace advice given to you by your health care provider. Make sure you discuss any questions you have with your health care provider. °Document Released: 11/22/2015 Document Revised: 01/06/2016 Document Reviewed: 09/03/2015 °[employer] Interactive Patient Education © 2017 [employer] ° °

## 2016-07-11 NOTE — Progress Notes (Signed)
Patient: Ivan Kennedy Male    DOB: 1946/01/20   70 y.o.   MRN: [id number] Visit Date: 07/11/2016  Today's Provider: Mar Daring, PA-C   Chief Complaint  Patient presents with  . Diabetes  . Hypertension  . Hyperlipidemia   Subjective:    HPI  Diabetes Mellitus Type II, Follow-up:   Lab Results  Component Value Date   HGBA1C 6.6 07/11/2016   HGBA1C 7.2 (H) 04/04/2016   HGBA1C 7.0 11/02/2015   Last seen for diabetes 3 months ago.  Management since then includes no changes. He reports excellent compliance with treatment. He is not having side effects.  Current symptoms include none and have been stable. Home blood sugar records: fasting range: 100's  Episodes of hypoglycemia? no   Current Insulin Regimen:  Most Recent Eye Exam: UTD Weight trend: stable Prior visit with dietician: no Current diet: in general, a "healthy" diet   Current exercise: walking  ------------------------------------------------------------------------   Hypertension, follow-up:  BP Readings from Last 3 Encounters:  07/11/16 132/76  06/08/16 140/78  04/04/16 118/78    He was last seen for hypertension 3 months ago.  BP at that visit was 140/78. Management since that visit includes no changes.He reports excellent compliance with treatment. He is not having side effects.  He is exercising. He is not adherent to low salt diet.   Outside blood pressures are stable. He is experiencing none.  Patient denies chest pain.   Cardiovascular risk factors include advanced age (older than 62 for men, 60 for women), diabetes mellitus and hypertension.  Use of agents associated with hypertension: none.   ------------------------------------------------------------------------    Lipid/Cholesterol, Follow-up:   Last seen for this 3 months ago.  Management since that visit includes no changes.  Last Lipid Panel:    Component Value Date/Time   CHOL 102 04/04/2016 0851   TRIG 115 04/04/2016 0851   HDL 38 (L) 04/04/2016 0851   CHOLHDL 2.7 04/04/2016 0851   LDLCALC 41 04/04/2016 0851    He reports excellent compliance with treatment. He is not having side effects.   Wt Readings from Last 3 Encounters:  07/11/16 241 lb (109.3 kg)  06/08/16 248 lb (112.5 kg)  04/04/16 240 lb 6.4 oz (109 kg)    ------------------------------------------------------------------------      No Known Allergies   Current Outpatient Prescriptions:  .  aspirin EC 81 MG tablet, Take by mouth., Disp: , Rfl:  .  diphenoxylate-atropine (LOMOTIL) 2.5-0.025 MG tablet, Take 1 tablet by mouth 2 (two) times daily., Disp: 60 tablet, Rfl: 3 .  doxycycline (ADOXA) 50 MG tablet, Take 1 tablet (50 mg total) by mouth daily., Disp: 90 tablet, Rfl: 1 .  glipiZIDE (GLUCOTROL XL) 5 MG 24 hr tablet, Take 1 tablet (5 mg total) by mouth 2 (two) times daily., Disp: 180 tablet, Rfl: 1 .  loratadine-pseudoephedrine (CLARITIN-D 24-HOUR) 10-240 MG per 24 hr tablet, Take 1 tablet by mouth daily as needed for allergies., Disp: , Rfl:  .  losartan-hydrochlorothiazide (HYZAAR) 100-12.5 MG tablet, Take 1 tablet by mouth daily., Disp: 90 tablet, Rfl: 1 .  metFORMIN (GLUCOPHAGE) 1000 MG tablet, Take 1 tablet (1,000 mg total) by mouth 2 (two) times daily., Disp: 180 tablet, Rfl: 1 .  metoprolol succinate (TOPROL-XL) 25 MG 24 hr tablet, 1 TABLET ER 24HR, ORAL, EVERY DAY, Disp: 90 tablet, Rfl: 3 .  metroNIDAZOLE (METROGEL) 1 % gel, APPLY TO ROSACEA ONCE DAILY., Disp: , Rfl: 5 .  omeprazole (  PRILOSEC) 40 MG capsule, TAKE ONE CAPSULE BY MOUTH DAILY, Disp: 90 capsule, Rfl: 3 .  ONE TOUCH ULTRA TEST test strip, USE AS DIRECTED TO TEST ONCE DAILY, Disp: 100 each, Rfl: 1 .  simvastatin (ZOCOR) 20 MG tablet, Take 1 tablet (20 mg total) by mouth every evening., Disp: 90 tablet, Rfl: 3 .  vitamin B-12 (CYANOCOBALAMIN) 1000 MCG tablet, Take 1,000 mcg by mouth daily., Disp: , Rfl:  .  tiZANidine (ZANAFLEX) 4 MG tablet,  Take 1 tablet (4 mg total) by mouth every 8 (eight) hours as needed for muscle spasms. (Patient not taking: Reported on 07/11/2016), Disp: 30 tablet, Rfl: 0  Review of Systems  Constitutional: Negative.   Respiratory: Negative.   Cardiovascular: Negative.   Gastrointestinal: Negative.   Endocrine: Negative.   Musculoskeletal: Negative.   Neurological: Negative.     Social History  Substance Use Topics  . Smoking status: Never Smoker  . Smokeless tobacco: Never Used  . Alcohol use 0.0 oz/week     Comment: occasionally   Objective:   BP 132/76 (BP Location: Right Arm, Patient Position: Sitting, Cuff Size: Large)   Pulse 64   Temp 97.6 F (36.4 C) (Oral)   Resp 16   Ht 6\' 1"  (1.854 m)   Wt 241 lb (109.3 kg)   BMI 31.80 kg/m   Physical Exam  Constitutional: He appears well-developed and well-nourished. No distress.  HENT:  Head: Normocephalic and atraumatic.  Neck: Normal range of motion. Neck supple. No JVD present. No tracheal deviation present. No thyromegaly present.  Cardiovascular: Normal rate, regular rhythm and normal heart sounds.  Exam reveals no gallop and no friction rub.   No murmur heard. Pulmonary/Chest: Effort normal and breath sounds normal. No respiratory distress. He has no wheezes. He has no rales.  Musculoskeletal: He exhibits no edema.  Lymphadenopathy:    He has no cervical adenopathy.  Skin: He is not diaphoretic.  Vitals reviewed.     Assessment & Plan:     1. Type 2 diabetes mellitus without complication, without long-term current use of insulin (HCC) Much improved today to 6.6 from 7.2. Patient stopped drinking sweet tea. Continue current medical treatment plan with metformin 1000mg  BID and glipizide 5mg  BID. Meds refilled as below. I will see him back in 3 months for AWV. - POCT glycosylated hemoglobin (Hb A1C) - metFORMIN (GLUCOPHAGE) 1000 MG tablet; Take 1 tablet (1,000 mg total) by mouth 2 (two) times daily.  Dispense: 180 tablet; Refill:  1 - glipiZIDE (GLUCOTROL XL) 5 MG 24 hr tablet; Take 1 tablet (5 mg total) by mouth 2 (two) times daily.  Dispense: 180 tablet; Refill: 1  2. Essential hypertension Stable. Continue Hyzaar 100-12.5mg  daily and metoprolol 25mg  daily.  3. Hyperlipidemia, unspecified hyperlipidemia type Stable. Continue Simvastatin 20mg  daily.   4. Irritable bowel syndrome with diarrhea Stable. Diagnosis pulled for medication refill. Continue current medical treatment plan. - diphenoxylate-atropine (LOMOTIL) 2.5-0.025 MG tablet; Take 1 tablet by mouth 2 (two) times daily.  Dispense: 60 tablet; Refill: 5  5. Need for influenza vaccination Flu vaccine given today without complication. Patient sat upright for 15 minutes to check for adverse reaction before being released. - Flu vaccine HIGH DOSE PF  The entirety of the information documented in the History of Present Illness, Review of Systems and Physical Exam were personally obtained by me. Portions of this information were initially documented by Lendon Ka, CMA and reviewed by me for thoroughness and accuracy.      Anderson Malta  Dorothy Puffer, PA-C  [facility]

## 2016-10-04 DIAGNOSIS — L821 Other seborrheic keratosis: Secondary | ICD-10-CM | POA: Diagnosis not present

## 2016-10-04 DIAGNOSIS — L718 Other rosacea: Secondary | ICD-10-CM | POA: Diagnosis not present

## 2016-10-04 DIAGNOSIS — Z85828 Personal history of other malignant neoplasm of skin: Secondary | ICD-10-CM | POA: Diagnosis not present

## 2016-10-04 DIAGNOSIS — D485 Neoplasm of uncertain behavior of skin: Secondary | ICD-10-CM | POA: Diagnosis not present

## 2016-10-04 DIAGNOSIS — L570 Actinic keratosis: Secondary | ICD-10-CM | POA: Diagnosis not present

## 2016-10-04 DIAGNOSIS — B079 Viral wart, unspecified: Secondary | ICD-10-CM | POA: Diagnosis not present

## 2016-10-04 DIAGNOSIS — L280 Lichen simplex chronicus: Secondary | ICD-10-CM | POA: Diagnosis not present

## 2016-10-11 ENCOUNTER — Ambulatory Visit (INDEPENDENT_AMBULATORY_CARE_PROVIDER_SITE_OTHER): Payer: PPO

## 2016-10-11 VITALS — BP 148/80 | HR 64 | Temp 97.8°F | Ht 73.0 in | Wt 246.2 lb

## 2016-10-11 DIAGNOSIS — Z0000 Encounter for general adult medical examination without abnormal findings: Secondary | ICD-10-CM | POA: Diagnosis not present

## 2016-10-11 NOTE — Progress Notes (Signed)
Subjective:   Ivan Kennedy is a 71 y.o. male who presents for Medicare Annual/Subsequent preventive examination.  Review of Systems:  N/A  Cardiac Risk Factors include: advanced age (>5men, >22 women);diabetes mellitus;dyslipidemia;hypertension;male gender;obesity (BMI >30kg/m2)     Objective:    Vitals: BP (!) 148/80 (BP Location: Right Arm)   Pulse 64   Temp 97.8 F (36.6 C) (Oral)   Ht 6\' 1"  (1.854 m)   Wt 246 lb 3.2 oz (111.7 kg)   BMI 32.48 kg/m   Body mass index is 32.48 kg/m.  Tobacco History  Smoking Status  . Never Smoker  Smokeless Tobacco  . Never Used     Counseling given: Not Answered   Past Medical History:  Diagnosis Date  . Colon polyp 2005  . Diabetes mellitus ([place])   . Hyperlipidemia   . Hypertension   . Rosacea   . Sleep apnea    Past Surgical History:  Procedure Laterality Date  . ANAL FISSURE REPAIR  1998  . COLONOSCOPY  2005, 2010  . 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 Earnestine Leys  . shoulder spurs Right 2000   Family History  Problem Relation Age of Onset  . Diabetes Mother    History  Sexual Activity  . Sexual activity: Not on file    Outpatient Encounter Prescriptions as of 10/11/2016  Medication Sig  . aspirin EC 81 MG tablet Take by mouth.  . diphenoxylate-atropine (LOMOTIL) 2.5-0.025 MG tablet Take 1 tablet by mouth 2 (two) times daily.  Marland Kitchen doxycycline (ADOXA) 50 MG tablet Take 1 tablet (50 mg total) by mouth daily.  Marland Kitchen glipiZIDE (GLUCOTROL XL) 5 MG 24 hr tablet Take 1 tablet (5 mg total) by mouth 2 (two) times daily.  Marland Kitchen loratadine-pseudoephedrine (CLARITIN-D 24-HOUR) 10-240 MG per 24 hr tablet Take 1 tablet by mouth daily as needed for allergies.  Marland Kitchen losartan-hydrochlorothiazide (HYZAAR) 100-12.5 MG tablet Take 1 tablet by mouth daily.  . metFORMIN (GLUCOPHAGE) 1000 MG tablet Take 1 tablet (1,000 mg total) by mouth 2 (two) times daily.  . metoprolol succinate (TOPROL-XL) 25 MG 24 hr tablet 1 TABLET ER 24HR,  ORAL, EVERY DAY  . metroNIDAZOLE (METROGEL) 1 % gel APPLY TO ROSACEA ONCE DAILY.  Marland Kitchen omeprazole (PRILOSEC) 40 MG capsule TAKE ONE CAPSULE BY MOUTH DAILY  . ONE TOUCH ULTRA TEST test strip USE AS DIRECTED TO TEST ONCE DAILY  . simvastatin (ZOCOR) 20 MG tablet Take 1 tablet (20 mg total) by mouth every evening.  . vitamin B-12 (CYANOCOBALAMIN) 1000 MCG tablet Take 1,000 mcg by mouth daily.  Marland Kitchen tiZANidine (ZANAFLEX) 4 MG tablet Take 1 tablet (4 mg total) by mouth every 8 (eight) hours as needed for muscle spasms. (Patient not taking: Reported on 07/11/2016)   No facility-administered encounter medications on file as of 10/11/2016.     Activities of Daily Living In your present state of health, do you have any difficulty performing the following activities: 10/11/2016 06/08/2016  Hearing? N N  Vision? N N  Difficulty concentrating or making decisions? N N  Walking or climbing stairs? N N  Dressing or bathing? N -  Doing errands, shopping? N N  Preparing Food and eating ? N -  Using the Toilet? N -  In the past six months, have you accidently leaked urine? N -  Do you have problems with loss of bowel control? N -  Managing your Medications? N -  Managing your Finances? N -  Housekeeping or managing your  Housekeeping? 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 Increase water intake          Starting 10/11/16, I will increase my water intake to 4-6 glasses a day.       Fall Risk Fall Risk  10/11/2016 06/08/2016 01/21/2015  Falls in the past  year? No No No   Depression Screen PHQ 2/9 Scores 10/11/2016 06/08/2016 01/21/2015  PHQ - 2 Score 0 0 0    Cognitive Function     6CIT Screen 10/11/2016  What Year? 0 points  What month? 0 points  What time? 0 points  Count back from 20 0 points  Months in reverse 0 points  Repeat phrase 2 points  Total Score 2    Immunization History  Administered Date(s) Administered  . Hepatitis B 05/29/2012, 07/29/2012, 03/18/2013  . Influenza, High Dose Seasonal PF 05/06/2015, 07/11/2016  . Pneumococcal Conjugate-13 07/06/2014  . Pneumococcal Polysaccharide-23 05/29/2012   Screening Tests Health Maintenance  Topic Date Due  . Hepatitis C Screening  10/15/2016 (Originally Dec 31, 1945)  . TETANUS/TDAP  08/14/2017 (Originally 12/23/1964)  . OPHTHALMOLOGY EXAM  10/12/2016  . FOOT EXAM  11/01/2016  . HEMOGLOBIN A1C  01/08/2017  . PNA vac Low Risk Adult (2 of 2 - PPSV23) 05/29/2017  . COLONOSCOPY  10/28/2024  . INFLUENZA VACCINE  Completed      Plan:  I have personally reviewed and addressed the Medicare Annual Wellness questionnaire and have noted the following in the patient's chart:  A. Medical and social history B. Use of alcohol, tobacco or illicit drugs  C. Current medications and supplements D. Functional ability and status E.  Nutritional status F.  Physical activity G. Advance directives H. List of other physicians I.  Hospitalizations, surgeries, and ER visits in previous 12 months J.  [place] such as hearing and vision if needed, cognitive and depression L. Referrals and appointments - none  In addition, I have reviewed and discussed with patient certain preventive protocols, quality metrics, and best practice recommendations. A written personalized care plan for preventive services as well as general preventive health recommendations were provided to patient.  See attached scanned questionnaire for additional information.   Signed,  Fabio Neighbors,  LPN Nurse Health Advisor   MD Recommendations: None. Pt declined tetanus today. Pt would like to have Hepatitis C screening when he has next apt with Jenni on 10/16/16.  I have reviewed the documentation and information obtained by Fabio Neighbors, LPN in the above chart and agree as above. I was available for consultation if any questions or issues arose.  [name], PA-C

## 2016-10-11 NOTE — Patient Instructions (Signed)
 Health Maintenance, Male A healthy lifestyle and preventive care is important for your health and wellness. Ask your health care provider about what schedule of regular examinations is right for you. What should I know about weight and diet?  Eat a Healthy Diet  Eat plenty of vegetables, fruits, whole grains, low-fat dairy products, and lean protein.  Do not eat a lot of foods high in solid fats, added sugars, or salt. Maintain a Healthy Weight  Regular exercise can help you achieve or maintain a healthy weight. You should:  Do at least 150 minutes of exercise each week. The exercise should increase your heart rate and make you sweat (moderate-intensity exercise).  Do strength-training exercises at least twice a week. Watch Your Levels of Cholesterol and Blood Lipids  Have your blood tested for lipids and cholesterol every 5 years starting at 69 years of age. If you are at high risk for heart disease, you should start having your blood tested when you are 69 years old. You may need to have your cholesterol levels checked more often if:  Your lipid or cholesterol levels are high.  You are older than 69 years of age.  You are at high risk for heart disease. What should I know about cancer screening? Many types of cancers can be detected early and may often be prevented. Lung Cancer  You should be screened every year for lung cancer if:  You are a current smoker who has smoked for at least 30 years.  You are a former smoker who has quit within the past 15 years.  Talk to your health care provider about your screening options, when you should start screening, and how often you should be screened. Colorectal Cancer  Routine colorectal cancer screening usually begins at 69 years of age and should be repeated every 5-10 years until you are 69 years old. You may need to be screened more often if early forms of precancerous polyps or small growths are found. Your health care provider  may recommend screening at an earlier age if you have risk factors for colon cancer.  Your health care provider may recommend using home test kits to check for hidden blood in the stool.  A small camera at the end of a tube can be used to examine your colon (sigmoidoscopy or colonoscopy). This checks for the earliest forms of colorectal cancer. Prostate and Testicular Cancer  Depending on your age and overall health, your health care provider may do certain tests to screen for prostate and testicular cancer.  Talk to your health care provider about any symptoms or concerns you have about testicular or prostate cancer. Skin Cancer  Check your skin from head to toe regularly.  Tell your health care provider about any new moles or changes in moles, especially if:  There is a change in a mole's size, shape, or color.  You have a mole that is larger than a pencil eraser.  Always use sunscreen. Apply sunscreen liberally and repeat throughout the day.  Protect yourself by wearing long sleeves, pants, a wide-brimmed hat, and sunglasses when outside. What should I know about heart disease, diabetes, and high blood pressure?  If you are 18-39 years of age, have your blood pressure checked every 3-5 years. If you are 40 years of age or older, have your blood pressure checked every year. You should have your blood pressure measured twice-once when you are at a hospital or clinic, and once when you are not at   a hospital or clinic. Record the average of the two measurements. To check your blood pressure when you are not at a hospital or clinic, you can use:  An automated blood pressure machine at a pharmacy.  A home blood pressure monitor.  Talk to your health care provider about your target blood pressure.  If you are between 45-79 years old, ask your health care provider if you should take aspirin to prevent heart disease.  Have regular diabetes screenings by checking your fasting blood sugar  level.  If you are at a normal weight and have a low risk for diabetes, have this test once every three years after the age of 45.  If you are overweight and have a high risk for diabetes, consider being tested at a younger age or more often.  A one-time screening for abdominal aortic aneurysm (AAA) by ultrasound is recommended for men aged 65-75 years who are current or former smokers. What should I know about preventing infection? Hepatitis B  If you have a higher risk for hepatitis B, you should be screened for this virus. Talk with your health care provider to find out if you are at risk for hepatitis B infection. Hepatitis C  Blood testing is recommended for:  Everyone born from 1945 through 1965.  Anyone with known risk factors for hepatitis C. Sexually Transmitted Diseases (STDs)  You should be screened each year for STDs including gonorrhea and chlamydia if:  You are sexually active and are younger than 69 years of age.  You are older than 69 years of age and your health care provider tells you that you are at risk for this type of infection.  Your sexual activity has changed since you were last screened and you are at an increased risk for chlamydia or gonorrhea. Ask your health care provider if you are at risk.  Talk with your health care provider about whether you are at high risk of being infected with HIV. Your health care provider may recommend a prescription medicine to help prevent HIV infection. What else can I do?  Schedule regular health, dental, and eye exams.  Stay current with your vaccines (immunizations).  Do not use any tobacco products, such as cigarettes, chewing tobacco, and e-cigarettes. If you need help quitting, ask your health care provider.  Limit alcohol intake to no more than 2 drinks per day. One drink equals 12 ounces of beer, 5 ounces of wine, or 1 ounces of hard liquor.  Do not use street drugs.  Do not share needles.  Ask your health  care provider for help if you need support or information about quitting drugs.  Tell your health care provider if you often feel depressed.  Tell your health care provider if you have ever been abused or do not feel safe at home. This information is not intended to replace advice given to you by your health care provider. Make sure you discuss any questions you have with your health care provider. Document Released: 01/27/2008 Document Revised: 03/29/2016 Document Reviewed: 05/04/2015 Elsevier Interactive Patient Education  2017 [employer]  

## 2016-10-16 ENCOUNTER — Encounter: Payer: PPO | Admitting: Physician Assistant

## 2016-10-23 DIAGNOSIS — B078 Other viral warts: Secondary | ICD-10-CM | POA: Diagnosis not present

## 2016-10-25 ENCOUNTER — Encounter: Payer: Self-pay | Admitting: Physician Assistant

## 2016-10-25 ENCOUNTER — Ambulatory Visit (INDEPENDENT_AMBULATORY_CARE_PROVIDER_SITE_OTHER): Payer: PPO | Admitting: Physician Assistant

## 2016-10-25 VITALS — BP 126/78 | HR 68 | Temp 97.6°F | Resp 16 | Wt 243.8 lb

## 2016-10-25 DIAGNOSIS — L719 Rosacea, unspecified: Secondary | ICD-10-CM

## 2016-10-25 DIAGNOSIS — E785 Hyperlipidemia, unspecified: Secondary | ICD-10-CM | POA: Diagnosis not present

## 2016-10-25 DIAGNOSIS — E119 Type 2 diabetes mellitus without complications: Secondary | ICD-10-CM | POA: Diagnosis not present

## 2016-10-25 DIAGNOSIS — E7800 Pure hypercholesterolemia, unspecified: Secondary | ICD-10-CM | POA: Diagnosis not present

## 2016-10-25 DIAGNOSIS — I10 Essential (primary) hypertension: Secondary | ICD-10-CM | POA: Diagnosis not present

## 2016-10-25 DIAGNOSIS — G4730 Sleep apnea, unspecified: Secondary | ICD-10-CM | POA: Diagnosis not present

## 2016-10-25 DIAGNOSIS — R269 Unspecified abnormalities of gait and mobility: Secondary | ICD-10-CM | POA: Diagnosis not present

## 2016-10-25 LAB — POCT GLYCOSYLATED HEMOGLOBIN (HGB A1C)
ESTIMATED AVERAGE GLUCOSE: 154
HEMOGLOBIN A1C: 7

## 2016-10-25 MED ORDER — DOXYCYCLINE MONOHYDRATE 50 MG PO TABS: 50.0000 mg | ORAL_TABLET | Freq: Every day | ORAL | 1 refills | Status: DC

## 2016-10-25 MED ORDER — LOSARTAN POTASSIUM-HCTZ 100-12.5 MG PO TABS: 1.0000 | ORAL_TABLET | Freq: Every day | ORAL | 3 refills | Status: DC

## 2016-10-25 MED ORDER — SIMVASTATIN 20 MG PO TABS: 20.0000 mg | ORAL_TABLET | Freq: Every evening | ORAL | 3 refills | Status: DC

## 2016-10-25 MED ORDER — METOPROLOL SUCCINATE ER 25 MG PO TB24: ORAL_TABLET | ORAL | 3 refills | Status: DC

## 2016-10-25 NOTE — Progress Notes (Signed)
Patient: Ivan Kennedy Male    DOB: [birth year]   71 y.o.   MRN: [id number] Visit Date: 10/25/2016  Today's Provider: Mar Daring, PA-C   Chief Complaint  Patient presents with  . Diabetes  . Hypertension  . Hyperlipidemia   Subjective:    HPI  Diabetes Mellitus Type II, Follow-up:   Lab Results  Component Value Date   HGBA1C 7.0 10/25/2016   HGBA1C 6.6 07/11/2016   HGBA1C 7.2 (H) 04/04/2016   Last seen for diabetes 3 months ago.  Management since then includes no changes. He reports excellent compliance with treatment. He is not having side effects.  Current symptoms include none and have been stable. Home blood sugar records: fasting range: 120-150's  Episodes of hypoglycemia? no   Current Insulin Regimen: none Most Recent Eye Exam: not up to date Weight trend: stable Prior visit with dietician: no Current diet: in general, a "healthy" diet   Current exercise: walking  ------------------------------------------------------------------------   Hypertension, follow-up:  BP Readings from Last 3 Encounters:  10/25/16 126/78  10/11/16 (!) 148/80  07/11/16 132/76    He was last seen for hypertension 4 months ago.  BP at that visit was  Management since that visit includes none.He reports excellent compliance with treatment. He is not having side effects.  He is not exercising. He is adherent to low salt diet.   Outside blood pressures are not being checked. He is experiencing none.  Patient denies chest pain, dyspnea, exertional chest pressure/discomfort, fatigue, lower extremity edema, near-syncope, orthopnea, palpitations and syncope.   Cardiovascular risk factors include advanced age (older than 31 for men, 3 for women), diabetes mellitus, dyslipidemia, hypertension and male gender.  Use of agents associated with hypertension: none.   ------------------------------------------------------------------------    Lipid/Cholesterol,  Follow-up:   Last seen for this 6 months ago.  Management since that visit includes none.  Last Lipid Panel:    Component Value Date/Time   CHOL 102 04/04/2016 0851   TRIG 115 04/04/2016 0851   HDL 38 (L) 04/04/2016 0851   CHOLHDL 2.7 04/04/2016 0851   LDLCALC 41 04/04/2016 0851    He reports excellent compliance with treatment. He is not having side effects.   Wt Readings from Last 3 Encounters:  10/25/16 243 lb 12.8 oz (110.6 kg)  10/11/16 246 lb 3.2 oz (111.7 kg)  07/11/16 241 lb (109.3 kg)   ------------------------------------------------------------------------    No Known Allergies   Current Outpatient Prescriptions:  .  aspirin EC 81 MG tablet, Take by mouth., Disp: , Rfl:  .  diphenoxylate-atropine (LOMOTIL) 2.5-0.025 MG tablet, Take 1 tablet by mouth 2 (two) times daily., Disp: 60 tablet, Rfl: 5 .  doxycycline (ADOXA) 50 MG tablet, Take 1 tablet (50 mg total) by mouth daily., Disp: 90 tablet, Rfl: 1 .  glipiZIDE (GLUCOTROL XL) 5 MG 24 hr tablet, Take 1 tablet (5 mg total) by mouth 2 (two) times daily., Disp: 180 tablet, Rfl: 1 .  loratadine-pseudoephedrine (CLARITIN-D 24-HOUR) 10-240 MG per 24 hr tablet, Take 1 tablet by mouth daily as needed for allergies., Disp: , Rfl:  .  losartan-hydrochlorothiazide (HYZAAR) 100-12.5 MG tablet, Take 1 tablet by mouth daily., Disp: 90 tablet, Rfl: 1 .  metFORMIN (GLUCOPHAGE) 1000 MG tablet, Take 1 tablet (1,000 mg total) by mouth 2 (two) times daily., Disp: 180 tablet, Rfl: 1 .  metoprolol succinate (TOPROL-XL) 25 MG 24 hr tablet, 1 TABLET ER 24HR, ORAL, EVERY DAY, Disp: 90 tablet, Rfl:  3 .  metroNIDAZOLE (METROGEL) 1 % gel, APPLY TO ROSACEA ONCE DAILY., Disp: , Rfl: 5 .  omeprazole (PRILOSEC) 40 MG capsule, TAKE ONE CAPSULE BY MOUTH DAILY, Disp: 90 capsule, Rfl: 3 .  ONE TOUCH ULTRA TEST test strip, USE AS DIRECTED TO TEST ONCE DAILY, Disp: 100 each, Rfl: 1 .  simvastatin (ZOCOR) 20 MG tablet, Take 1 tablet (20 mg total) by  mouth every evening., Disp: 90 tablet, Rfl: 3 .  vitamin B-12 (CYANOCOBALAMIN) 1000 MCG tablet, Take 1,000 mcg by mouth daily., Disp: , Rfl:   Review of Systems  Constitutional: Negative.   HENT: Negative.   Eyes: Negative.   Respiratory: Negative.   Cardiovascular: Negative.   Gastrointestinal: Negative.   Endocrine: Negative.   Genitourinary: Negative.   Musculoskeletal: Negative.   Skin: Negative.   Allergic/Immunologic: Negative.   Neurological: Negative.   Hematological: Negative.   Psychiatric/Behavioral: Negative.     Social History  Substance Use Topics  . Smoking status: Never Smoker  . Smokeless tobacco: Never Used  . Alcohol use 0.0 oz/week     Comment: occasionally- beer 1/month   Objective:   BP 126/78 (BP Location: Left Arm, Patient Position: Sitting, Cuff Size: Large)   Pulse 68   Temp 97.6 F (36.4 C) (Oral)   Resp 16   Wt 243 lb 12.8 oz (110.6 kg)   SpO2 99%   BMI 32.17 kg/m  Vitals:   10/25/16 1019  BP: 126/78  Pulse: 68  Resp: 16  Temp: 97.6 F (36.4 C)  TempSrc: Oral  SpO2: 99%  Weight: 243 lb 12.8 oz (110.6 kg)     Physical Exam  Constitutional: He is oriented to person, place, and time. He appears well-developed and well-nourished.  HENT:  Head: Normocephalic and atraumatic.  Right Ear: Hearing, tympanic membrane, external ear and ear canal normal.  Left Ear: Hearing, tympanic membrane, external ear and ear canal normal.  Nose: Nose normal.  Mouth/Throat: Uvula is midline, oropharynx is clear and moist and mucous membranes are normal.  Eyes: Conjunctivae and EOM are normal. Pupils are equal, round, and reactive to light. Right eye exhibits no discharge.  Neck: Normal range of motion. Neck supple. Carotid bruit is not present. No tracheal deviation present. No thyromegaly present.  Cardiovascular: Normal rate, regular rhythm, normal heart sounds and intact distal pulses.   No murmur heard. Pulmonary/Chest: Effort normal and breath  sounds normal. No respiratory distress. He has no wheezes. He has no rales. He exhibits no tenderness.  Abdominal: Soft. He exhibits no distension and no mass. There is no tenderness. There is no rebound and no guarding.  Musculoskeletal: Normal range of motion. He exhibits no edema or tenderness.  Lymphadenopathy:    He has no cervical adenopathy.  Neurological: He is alert and oriented to person, place, and time. He has normal reflexes. No cranial nerve deficit. He exhibits normal muscle tone. Coordination normal.  Skin: Skin is warm and dry. No rash noted. No erythema.  Psychiatric: He has a normal mood and affect. His behavior is normal. Judgment and thought content normal.       Assessment & Plan:     1. Type 2 diabetes mellitus without complication, without long-term current use of insulin (HCC) A1c up to 7.0 from 6.8. Continue metformin and glipizide. I will see him back in 6 months for recheck and to get all labs. - POCT glycosylated hemoglobin (Hb A1C)  2. Essential hypertension Stable. Diagnosis pulled for medication refill. Continue current medical  treatment plan. Will check labs in 6 months. - metoprolol succinate (TOPROL-XL) 25 MG 24 hr tablet; 1 TABLET ER 24HR, ORAL, EVERY DAY  Dispense: 90 tablet; Refill: 3 - losartan-hydrochlorothiazide (HYZAAR) 100-12.5 MG tablet; Take 1 tablet by mouth daily.  Dispense: 90 tablet; Refill: 3  3. Pure hypercholesterolemia Stable. Diagnosis pulled for medication refill. Continue current medical treatment plan. Will check labs in 6 months. - simvastatin (ZOCOR) 20 MG tablet; Take 1 tablet (20 mg total) by mouth every evening.  Dispense: 90 tablet; Refill: 3  5. Rosacea Stable. Diagnosis pulled for medication refill. Continue current medical treatment plan. - doxycycline (ADOXA) 50 MG tablet; Take 1 tablet (50 mg total) by mouth daily.  Dispense: 90 tablet; Refill: Dwight, PA-C  [facility]

## 2016-10-25 NOTE — Patient Instructions (Signed)
Carbohydrate Counting for Diabetes Mellitus, Adult Carbohydrate counting is a method for keeping track of how many carbohydrates you eat. Eating carbohydrates naturally increases the amount of sugar (glucose) in the blood. Counting how many carbohydrates you eat helps keep your blood glucose within normal limits, which helps you manage your diabetes (diabetes mellitus). It is important to know how many carbohydrates you can safely have in each meal. This is different for every person. A diet and nutrition specialist (registered dietitian) can help you make a meal plan and calculate how many carbohydrates you should have at each meal and snack. Carbohydrates are found in the following foods:  Grains, such as breads and cereals.  Dried beans and soy products.  Starchy vegetables, such as potatoes, peas, and corn.  Fruit and fruit juices.  Milk and yogurt.  Sweets and snack foods, such as cake, cookies, candy, chips, and soft drinks. How do I count carbohydrates? There are two ways to count carbohydrates in food. You can use either of the methods or a combination of both. Reading "Nutrition Facts" on packaged food  The "Nutrition Facts" list is included on the labels of almost all packaged foods and beverages in the [place] It includes:  The serving size.  Information about nutrients in each serving, including the grams (g) of carbohydrate per serving. To use the "Nutrition Facts":  Decide how many servings you will have.  Multiply the number of servings by the number of carbohydrates per serving.  The resulting number is the total amount of carbohydrates that you will be having. Learning standard serving sizes of other foods  When you eat foods containing carbohydrates that are not packaged or do not include "Nutrition Facts" on the label, you need to measure the servings in order to count the amount of carbohydrates:  Measure the foods that you will eat with a food scale or measuring  cup, if needed.  Decide how many standard-size servings you will eat.  Multiply the number of servings by 15. Most carbohydrate-rich foods have about 15 g of carbohydrates per serving.  For example, if you eat 8 oz (170 g) of strawberries, you will have eaten 2 servings and 30 g of carbohydrates (2 servings x 15 g = 30 g).  For foods that have more than one food mixed, such as soups and casseroles, you must count the carbohydrates in each food that is included. The following list contains standard serving sizes of common carbohydrate-rich foods. Each of these servings has about 15 g of carbohydrates:   hamburger bun or  English muffin.   oz (15 mL) syrup.   oz (14 g) jelly.  1 slice of bread.  1 six-inch tortilla.  3 oz (85 g) cooked rice or pasta.  4 oz (113 g) cooked dried beans.  4 oz (113 g) starchy vegetable, such as peas, corn, or potatoes.  4 oz (113 g) hot cereal.  4 oz (113 g) mashed potatoes or  of a large baked potato.  4 oz (113 g) canned or frozen fruit.  4 oz (120 mL) fruit juice.  4-6 crackers.  6 chicken nuggets.  6 oz (170 g) unsweetened dry cereal.  6 oz (170 g) plain fat-free yogurt or yogurt sweetened with artificial sweeteners.  8 oz (240 mL) milk.  8 oz (170 g) fresh fruit or one small piece of fruit.  24 oz (680 g) popped popcorn. Example of carbohydrate counting Sample meal  3 oz (85 g) chicken breast.  6 oz (  170 g) brown rice.  4 oz (113 g) corn.  8 oz (240 mL) milk.  8 oz (170 g) strawberries with sugar-free whipped topping. Carbohydrate calculation 1. Identify the foods that contain carbohydrates:  Rice.  Corn.  Milk.  Strawberries. 2. Calculate how many servings you have of each food:  2 servings rice.  1 serving corn.  1 serving milk.  1 serving strawberries. 3. Multiply each number of servings by 15 g:  2 servings rice x 15 g = 30 g.  1 serving corn x 15 g = 15 g.  1 serving milk x 15 g = 15  g.  1 serving strawberries x 15 g = 15 g. 4. Add together all of the amounts to find the total grams of carbohydrates eaten:  30 g + 15 g + 15 g + 15 g = 75 g of carbohydrates total. This information is not intended to replace advice given to you by your health care provider. Make sure you discuss any questions you have with your health care provider. Document Released: 07/31/2005 Document Revised: 02/18/2016 Document Reviewed: 01/12/2016 Elsevier Interactive Patient Education  2017 [employer]  

## 2016-10-26 ENCOUNTER — Encounter: Payer: PPO | Admitting: Physician Assistant

## 2016-10-30 DIAGNOSIS — G4733 Obstructive sleep apnea (adult) (pediatric): Secondary | ICD-10-CM | POA: Diagnosis not present

## 2016-11-07 ENCOUNTER — Ambulatory Visit: Payer: PPO

## 2016-11-09 ENCOUNTER — Ambulatory Visit: Payer: PPO

## 2016-12-28 DIAGNOSIS — H52223 Regular astigmatism, bilateral: Secondary | ICD-10-CM | POA: Diagnosis not present

## 2016-12-28 LAB — HM DIABETES EYE EXAM

## 2017-01-14 ENCOUNTER — Other Ambulatory Visit: Payer: Self-pay | Admitting: Physician Assistant

## 2017-01-14 DIAGNOSIS — K580 Irritable bowel syndrome with diarrhea: Secondary | ICD-10-CM

## 2017-01-15 NOTE — Telephone Encounter (Signed)
CALLED INTO [facility]

## 2017-01-26 ENCOUNTER — Other Ambulatory Visit: Payer: Self-pay | Admitting: Physician Assistant

## 2017-01-26 DIAGNOSIS — K219 Gastro-esophageal reflux disease without esophagitis: Secondary | ICD-10-CM

## 2017-01-26 MED ORDER — OMEPRAZOLE 40 MG PO CPDR
40.0000 mg | DELAYED_RELEASE_CAPSULE | Freq: Every day | ORAL | 3 refills | Status: DC
Start: 2017-01-26 — End: 2017-07-26

## 2017-01-26 NOTE — Telephone Encounter (Signed)
[facility] pharmacy faxed a request on the following medication. Thanks CC  omeprazole (PRILOSEC) 40 MG capsule  >Take one (1) capsule by mouth daily.

## 2017-01-26 NOTE — Telephone Encounter (Signed)
Please review. Thanks!  

## 2017-01-26 NOTE — Telephone Encounter (Signed)
Last ov 10/25/16 Last filled 02/02/16 Please review. Thank you. sd

## 2017-03-05 DIAGNOSIS — G4733 Obstructive sleep apnea (adult) (pediatric): Secondary | ICD-10-CM | POA: Diagnosis not present

## 2017-03-12 ENCOUNTER — Other Ambulatory Visit: Payer: Self-pay | Admitting: Physician Assistant

## 2017-03-12 DIAGNOSIS — E119 Type 2 diabetes mellitus without complications: Secondary | ICD-10-CM

## 2017-03-13 ENCOUNTER — Other Ambulatory Visit: Payer: Self-pay | Admitting: Physician Assistant

## 2017-03-13 DIAGNOSIS — Z85828 Personal history of other malignant neoplasm of skin: Secondary | ICD-10-CM | POA: Diagnosis not present

## 2017-03-13 DIAGNOSIS — Z872 Personal history of diseases of the skin and subcutaneous tissue: Secondary | ICD-10-CM | POA: Diagnosis not present

## 2017-03-13 DIAGNOSIS — E119 Type 2 diabetes mellitus without complications: Secondary | ICD-10-CM

## 2017-03-13 DIAGNOSIS — L718 Other rosacea: Secondary | ICD-10-CM | POA: Diagnosis not present

## 2017-03-13 DIAGNOSIS — L570 Actinic keratosis: Secondary | ICD-10-CM | POA: Diagnosis not present

## 2017-03-13 DIAGNOSIS — L918 Other hypertrophic disorders of the skin: Secondary | ICD-10-CM | POA: Diagnosis not present

## 2017-04-26 ENCOUNTER — Ambulatory Visit (INDEPENDENT_AMBULATORY_CARE_PROVIDER_SITE_OTHER): Payer: PPO | Admitting: Physician Assistant

## 2017-04-26 ENCOUNTER — Encounter: Payer: Self-pay | Admitting: Physician Assistant

## 2017-04-26 VITALS — BP 128/78 | HR 64 | Temp 97.7°F | Resp 16 | Ht 73.0 in | Wt 238.0 lb

## 2017-04-26 DIAGNOSIS — Z125 Encounter for screening for malignant neoplasm of prostate: Secondary | ICD-10-CM | POA: Diagnosis not present

## 2017-04-26 DIAGNOSIS — E7800 Pure hypercholesterolemia, unspecified: Secondary | ICD-10-CM

## 2017-04-26 DIAGNOSIS — I10 Essential (primary) hypertension: Secondary | ICD-10-CM | POA: Diagnosis not present

## 2017-04-26 DIAGNOSIS — K580 Irritable bowel syndrome with diarrhea: Secondary | ICD-10-CM

## 2017-04-26 DIAGNOSIS — E119 Type 2 diabetes mellitus without complications: Secondary | ICD-10-CM

## 2017-04-26 DIAGNOSIS — Z23 Encounter for immunization: Secondary | ICD-10-CM

## 2017-04-26 LAB — LIPID PANEL
CHOLESTEROL: 91 mg/dL (ref <200)
HDL: 38 mg/dL — AB (ref >40)
LDL Cholesterol (Calc): 38 mg/dL (calc)
Non-HDL Cholesterol (Calc): 53 mg/dL (calc) (ref <130)
Total CHOL/HDL Ratio: 2.4 (calc) (ref <5.0)
Triglycerides: 72 mg/dL (ref <150)

## 2017-04-26 LAB — COMPLETE METABOLIC PANEL WITH GFR
AG Ratio: 2.2 (calc) (ref 1.0–2.5)
ALT: 26 U/L (ref 9–46)
AST: 21 U/L (ref 10–35)
Albumin: 4.1 g/dL (ref 3.6–5.1)
Alkaline phosphatase (APISO): 47 U/L (ref 40–115)
BILIRUBIN TOTAL: 0.9 mg/dL (ref 0.2–1.2)
BUN: 18 mg/dL (ref 7–25)
CHLORIDE: 105 mmol/L (ref 98–110)
CO2: 27 mmol/L (ref 20–32)
Calcium: 9.4 mg/dL (ref 8.6–10.3)
Creat: 1 mg/dL (ref 0.70–1.25)
GFR, EST AFRICAN AMERICAN: 89 mL/min/{1.73_m2} (ref >=60)
GFR, Est Non African American: 76 mL/min/{1.73_m2} (ref >=60)
GLUCOSE: 142 mg/dL — AB (ref 65–99)
Globulin: 1.9 g/dL (calc) (ref 1.9–3.7)
Potassium: 3.6 mmol/L (ref 3.5–5.3)
Sodium: 141 mmol/L (ref 135–146)
TOTAL PROTEIN: 6 g/dL — AB (ref 6.1–8.1)

## 2017-04-26 LAB — CBC WITH DIFFERENTIAL/PLATELET
BASOS PCT: 0.5 %
Basophils Absolute: 28 cells/uL (ref 0–200)
EOS ABS: 110 {cells}/uL (ref 15–500)
Eosinophils Relative: 2 %
HEMATOCRIT: 39.2 % (ref 38.5–50.0)
HEMOGLOBIN: 13.5 g/dL (ref 13.2–17.1)
LYMPHS ABS: 1425 {cells}/uL (ref 850–3900)
MCH: 30.2 pg (ref 27.0–33.0)
MCHC: 34.4 g/dL (ref 32.0–36.0)
MCV: 87.7 fL (ref 80.0–100.0)
MONOS PCT: 7.8 %
MPV: 10.8 fL (ref 7.5–12.5)
NEUTROS ABS: 3509 {cells}/uL (ref 1500–7800)
Neutrophils Relative %: 63.8 %
Platelets: 140 10*3/uL (ref 140–400)
RBC: 4.47 10*6/uL (ref 4.20–5.80)
RDW: 13.4 % (ref 11.0–15.0)
Total Lymphocyte: 25.9 %
WBC mixed population: 429 cells/uL (ref 200–950)
WBC: 5.5 10*3/uL (ref 3.8–10.8)

## 2017-04-26 LAB — POCT GLYCOSYLATED HEMOGLOBIN (HGB A1C)
Est. average glucose Bld gHb Est-mCnc: 154
Hemoglobin A1C: 7

## 2017-04-26 LAB — PSA: PSA: 2.8 ng/mL (ref <=4.0)

## 2017-04-26 MED ORDER — GLIPIZIDE ER 5 MG PO TB24: 5.0000 mg | ORAL_TABLET | Freq: Two times a day (BID) | ORAL | 1 refills | Status: DC

## 2017-04-26 MED ORDER — METFORMIN HCL 1000 MG PO TABS: 1000.0000 mg | ORAL_TABLET | Freq: Two times a day (BID) | ORAL | 1 refills | Status: DC

## 2017-04-26 NOTE — Progress Notes (Signed)
Patient: Ivan Kennedy Male    DOB: 06-Dec-1945   71 y.o.   MRN: [id number] Visit Date: 04/26/2017  Today's Provider: Mar Daring, PA-C   Chief Complaint  Patient presents with  . Diabetes  . Hypertension  . Abdominal Pain   Subjective:    HPI  Diabetes Mellitus Type II, Follow-up:   Lab Results  Component Value Date   HGBA1C 7.0 10/25/2016   HGBA1C 6.6 07/11/2016   HGBA1C 7.2 (H) 04/04/2016    Last seen for diabetes 6 months ago.  Management since then includes no changes made. He reports excellent compliance with treatment. He is not having side effects.  Current symptoms include none and have been stable. Home blood sugar records: fasting range: 120-160's  Episodes of hypoglycemia? no   Current Insulin Regimen:  Most Recent Eye Exam: [facility] 12/28/2016 Weight trend: decreasing steadily Prior visit with dietician: no Current diet: in general, a "healthy" diet   Current exercise: walking  Pertinent Labs:    Component Value Date/Time   CHOL 102 04/04/2016 0851   TRIG 115 04/04/2016 0851   HDL 38 (L) 04/04/2016 0851   LDLCALC 41 04/04/2016 0851   CREATININE 1.23 04/04/2016 0851    Wt Readings from Last 3 Encounters:  04/26/17 238 lb (108 kg)  10/25/16 243 lb 12.8 oz (110.6 kg)  10/11/16 246 lb 3.2 oz (111.7 kg)  ------------------------------------------------------------------------   Hypertension, follow-up:  BP Readings from Last 3 Encounters:  04/26/17 128/78  10/25/16 126/78  10/11/16 (!) 148/80    He was last seen for hypertension 6 months ago.  BP at that visit was 126/78. Management changes since that visit include no changes. He reports excellent compliance with treatment. He is not having side effects.  He is exercising. He is adherent to low salt diet.   Outside blood pressures are stable. He is experiencing none.  Patient denies chest pain and lower extremity edema.   Cardiovascular risk factors include advanced age  (older than 37 for men, 67 for women), diabetes mellitus and hypertension.  Use of agents associated with hypertension: none.     Weight trend: decreasing steadily Wt Readings from Last 3 Encounters:  04/26/17 238 lb (108 kg)  10/25/16 243 lb 12.8 oz (110.6 kg)  10/11/16 246 lb 3.2 oz (111.7 kg)    Current diet: in general, a "healthy" diet   ------------------------------------------------------------------------  Patient reports he has been having abdominal pain and diarrhea on and off x's several months. Patient reports taking Lomotil 40 mg 1 two times daily. Patient reports that symptoms are present after eating. Patient has not identified triggers. Patient denies any nausea vomiting or blood in stools. He does have known IBS and states it is fairly unchanged. He has one BM daily, sometimes normal, sometimes loose. He reports his biggest issue is sometimes he has to know where bathrooms are when he goes out to eat. It doesn't happen every time but he occasionally gets abdominal pain with diarrhea after eating. He has not been able to find any specific food trigger.     No Known Allergies   Current Outpatient Prescriptions:  .  aspirin EC 81 MG tablet, Take by mouth., Disp: , Rfl:  .  diphenoxylate-atropine (LOMOTIL) 2.5-0.025 MG tablet, TAKE 1 TABLET BY MOUTH TWICE A DAY, Disp: 60 tablet, Rfl: 5 .  doxycycline (ADOXA) 50 MG tablet, Take 1 tablet (50 mg total) by mouth daily., Disp: 90 tablet, Rfl: 1 .  glipiZIDE (  GLUCOTROL XL) 5 MG 24 hr tablet, Take 1 tablet (5 mg total) by mouth 2 (two) times daily., Disp: 180 tablet, Rfl: 1 .  loratadine-pseudoephedrine (CLARITIN-D 24-HOUR) 10-240 MG per 24 hr tablet, Take 1 tablet by mouth daily as needed for allergies., Disp: , Rfl:  .  losartan-hydrochlorothiazide (HYZAAR) 100-12.5 MG tablet, Take 1 tablet by mouth daily., Disp: 90 tablet, Rfl: 3 .  metFORMIN (GLUCOPHAGE) 1000 MG tablet, TAKE 1 TABLET (1,000 MG TOTAL) BY MOUTH 2 (TWO) TIMES  DAILY., Disp: 180 tablet, Rfl: 1 .  metoprolol succinate (TOPROL-XL) 25 MG 24 hr tablet, 1 TABLET ER 24HR, ORAL, EVERY DAY, Disp: 90 tablet, Rfl: 3 .  metroNIDAZOLE (METROGEL) 1 % gel, APPLY TO ROSACEA ONCE DAILY., Disp: , Rfl: 5 .  omeprazole (PRILOSEC) 40 MG capsule, Take 1 capsule (40 mg total) by mouth daily., Disp: 90 capsule, Rfl: 3 .  ONE TOUCH ULTRA TEST test strip, USE TO TEST ONCE DAILY AS DIRECTED, Disp: 100 each, Rfl: 3 .  simvastatin (ZOCOR) 20 MG tablet, Take 1 tablet (20 mg total) by mouth every evening., Disp: 90 tablet, Rfl: 3 .  vitamin B-12 (CYANOCOBALAMIN) 1000 MCG tablet, Take 1,000 mcg by mouth daily., Disp: , Rfl:   Review of Systems  Constitutional: Negative.   Respiratory: Negative.   Cardiovascular: Negative.   Gastrointestinal: Positive for abdominal pain and diarrhea.    Social History  Substance Use Topics  . Smoking status: Never Smoker  . Smokeless tobacco: Never Used  . Alcohol use 0.0 oz/week     Comment: occasionally- beer 1/month   Objective:   BP 128/78 (BP Location: Left Arm, Patient Position: Sitting, Cuff Size: Large)   Pulse 64   Temp 97.7 F (36.5 C) (Oral)   Resp 16   Ht 6\' 1"  (1.854 m)   Wt 238 lb (108 kg)   SpO2 96%   BMI 31.40 kg/m  Vitals:   04/26/17 0819  BP: 128/78  Pulse: 64  Resp: 16  Temp: 97.7 F (36.5 C)  TempSrc: Oral  SpO2: 96%  Weight: 238 lb (108 kg)  Height: 6\' 1"  (1.854 m)     Physical Exam  Constitutional: He appears well-developed and well-nourished. No distress.  HENT:  Head: Normocephalic and atraumatic.  Neck: Normal range of motion. Neck supple. No JVD present. No tracheal deviation present. No thyromegaly present.  Cardiovascular: Normal rate, regular rhythm and normal heart sounds.  Exam reveals no gallop and no friction rub.   No murmur heard. Pulmonary/Chest: Effort normal and breath sounds normal. No respiratory distress. He has no wheezes. He has no rales.  Abdominal: Soft. Bowel sounds are  normal. He exhibits no distension and no mass. There is no tenderness.  Lymphadenopathy:    He has no cervical adenopathy.  Skin: He is not diaphoretic.  Vitals reviewed.  Diabetic Foot Exam - Simple   Simple Foot Form Diabetic Foot exam was performed with the following findings:  Yes 04/26/2017  9:17 AM  Visual Inspection No deformities, no ulcerations, no other skin breakdown bilaterally:  Yes Sensation Testing Intact to touch and monofilament testing bilaterally:  Yes Pulse Check Posterior Tibialis and Dorsalis pulse intact bilaterally:  Yes Comments       Assessment & Plan:      1. Type 2 diabetes mellitus without complication, without long-term current use of insulin (HCC) A1c stable at 7.0. Continue current medical treatment plan as below. Discussed possibly doing trial of decreasing metformin if Viberzi trial does not improve  IBS symptoms. Patient is in agreement. Will check labs as below and f/u pending results. I will see him back in 6 months for T2DM recheck.  - POCT glycosylated hemoglobin (Hb A1C) - CBC w/Diff/Platelet - Comprehensive Metabolic Panel (CMET) - glipiZIDE (GLUCOTROL XL) 5 MG 24 hr tablet; Take 1 tablet (5 mg total) by mouth 2 (two) times daily.  Dispense: 180 tablet; Refill: 1 - metFORMIN (GLUCOPHAGE) 1000 MG tablet; Take 1 tablet (1,000 mg total) by mouth 2 (two) times daily.  Dispense: 180 tablet; Refill: 1  2. Essential hypertension Stable. Continue current medical treatment plan with losartan-HCTZ 100-12.5mg , metorprolol 25mg . Will check labs as below and f/u pending results. - CBC w/Diff/Platelet - Comprehensive Metabolic Panel (CMET)  3. Pure hypercholesterolemia Stable. Continue current medical treatment plan with simvastatin 20mg . Will check labs as below and f/u pending results. - Lipid Profile - Comprehensive Metabolic Panel (CMET)  4. Irritable bowel syndrome with diarrhea Viberzi 75mg  samples given to patient today. He is to stop Lomotil.  If this works he is to call the office for refills. If this does not improve symptoms will consider decreasing metformin dose to see if this is cause for the diarrhea.  - CBC w/Diff/Platelet  5. Prostate cancer screening - PSA  6. Need for influenza vaccination Flu vaccine given today without complication. Patient sat upright for 15 minutes to check for adverse reaction before being released. - Flu vaccine HIGH DOSE PF       Mar Daring, PA-C  [facility]

## 2017-04-26 NOTE — Patient Instructions (Signed)
Eluxadoline oral tablets What is this medicine? ELUXADOLINE (ee LUX ah dol ine) is an intestinal disorder drug. It is used to treat irritable bowel syndrome with diarrhea. This medicine may be used for other purposes; ask your health care provider or pharmacist if you have questions. COMMON BRAND NAME(S): Viberzi What should I tell my health care provider before I take this medicine? They need to know if you have any of these conditions: -drink more than 3 alcohol-containing drinks per day -gallbladder disease -have no gallbladder -history of constipation -history of drug or alcohol abuse problems -history of pancreatitis or pancreatic disease -liver disease -stomach or intestine problems -an unusual or allergic reaction to eluxadoline, other medicines, foods, dyes, or preservatives -pregnant or trying to get pregnant -breast-feeding How should I use this medicine? Take this medicine by mouth with a glass of water. Follow the directions on the prescription label. Take this medicine with food. Take your medicine at regular intervals. Do not take it more often than directed. Do not stop taking except on your doctor's advice. Talk to your pediatrician regarding the use of this medicine in children. Special care may be needed. Overdosage: If you think you have taken too much of this medicine contact a poison control center or emergency room at once. NOTE: This medicine is only for you. Do not share this medicine with others. What if I miss a dose? If you miss a dose, take it as soon as you can. If it is almost time for your next dose, take only that dose. Do not take double or extra doses. What may interact with this medicine? This medicine may interact with the following medications: -alosetron -anticholinergics -bupropion -certain antibiotics like ciprofloxacin, clarithromycin, rifampin -cyclosporine -eltrombopag -ergot alkaloids like dihydroergotamine, ergonovine, ergotamine,  methylergonovine -fluconazole -gemfibrozil -loperamide -opiate agonist -paroxetine -pimozide -probenecid -quinidine -rosuvastatin -sirolimus -tacrolimus This list may not describe all possible interactions. Give your health care provider a list of all the medicines, herbs, non-prescription drugs, or dietary supplements you use. Also tell them if you smoke, drink alcohol, or use illegal drugs. Some items may interact with your medicine. What should I watch for while using this medicine? Tell your doctor or healthcare professional if your symptoms do not start to get better or if they get worse. This medicine may cause constipation. If you do not have a bowel movement, call your doctor or healthcare professional. You may get drowsy or dizzy. Do not drive, use machinery, or do anything that needs mental alertness until you know how this medicine affects you. Do not stand or sit up quickly, especially if you are an older patient. This reduces the risk of dizzy or fainting spells. Alcohol may interfere with the effect of this medicine. Avoid alcoholic drinks. What side effects may I notice from receiving this medicine? Side effects that you should report to your doctor or health care professional as soon as possible: -allergic reactions like skin rash, itching or hives, swelling of the face, lips, or tongue -breathing problems -constipation -right upper belly pain Side effects that usually do not require medical attention (report to your doctor or health care professional if they continue or are bothersome): -dizziness -nausea, vomiting -tiredness This list may not describe all possible side effects. Call your doctor for medical advice about side effects. You may report side effects to FDA at 1-800-FDA-1088. Where should I keep my medicine? Keep out of the reach of children. Store at room temperature between 20 and 25 degrees C (68 and  77 degrees F). Throw away any unused medicine after the  expiration date. NOTE: This sheet is a summary. It may not cover all possible information. If you have questions about this medicine, talk to your doctor, pharmacist, or health care provider.  2018 Elsevier/Gold Standard (2015-10-28 11:03:55)  

## 2017-05-02 ENCOUNTER — Telehealth: Payer: Self-pay

## 2017-05-02 NOTE — Telephone Encounter (Signed)
-----   Message from Mar Daring, [place] sent at 05/02/2017  9:09 AM EDT ----- All labs are within normal limits and stable.  Thanks! -JB

## 2017-05-02 NOTE — Telephone Encounter (Signed)
Patient advised as below.  

## 2017-06-25 DIAGNOSIS — G4733 Obstructive sleep apnea (adult) (pediatric): Secondary | ICD-10-CM | POA: Diagnosis not present

## 2017-07-04 ENCOUNTER — Other Ambulatory Visit: Payer: Self-pay | Admitting: Physician Assistant

## 2017-07-04 DIAGNOSIS — I10 Essential (primary) hypertension: Secondary | ICD-10-CM

## 2017-07-04 MED ORDER — LOSARTAN POTASSIUM-HCTZ 100-12.5 MG PO TABS: 1.0000 | ORAL_TABLET | Freq: Every day | ORAL | 3 refills | Status: DC

## 2017-07-04 NOTE — Telephone Encounter (Signed)
[facility]ed [facility] request for a 90-days supply for the following mediation. Thanks CC  losartan-hydrochlorothiazide (HYZAAR) 100-12.5 MG tablet

## 2017-07-15 ENCOUNTER — Other Ambulatory Visit: Payer: Self-pay | Admitting: Physician Assistant

## 2017-07-15 DIAGNOSIS — K580 Irritable bowel syndrome with diarrhea: Secondary | ICD-10-CM

## 2017-07-16 NOTE — Telephone Encounter (Signed)
[facility] 

## 2017-07-26 ENCOUNTER — Other Ambulatory Visit: Payer: Self-pay

## 2017-07-26 DIAGNOSIS — I10 Essential (primary) hypertension: Secondary | ICD-10-CM

## 2017-07-26 DIAGNOSIS — K219 Gastro-esophageal reflux disease without esophagitis: Secondary | ICD-10-CM

## 2017-07-26 MED ORDER — OMEPRAZOLE 40 MG PO CPDR: 40.0000 mg | DELAYED_RELEASE_CAPSULE | Freq: Every day | ORAL | 3 refills | Status: DC

## 2017-07-26 MED ORDER — METOPROLOL SUCCINATE ER 25 MG PO TB24: ORAL_TABLET | ORAL | 3 refills | Status: DC

## 2017-07-26 NOTE — Telephone Encounter (Signed)
[facility] request refill on the following medications:  Metoprolol Succ ER 25 MG tab Qty:90 And  Omeprazole DR 40 MG Capsule Qty:90  Thanks,  -Lacreasha Hinds

## 2017-07-27 ENCOUNTER — Telehealth: Payer: Self-pay | Admitting: Physician Assistant

## 2017-07-27 DIAGNOSIS — E7800 Pure hypercholesterolemia, unspecified: Secondary | ICD-10-CM

## 2017-07-27 MED ORDER — SIMVASTATIN 20 MG PO TABS: 20.0000 mg | ORAL_TABLET | Freq: Every evening | ORAL | 3 refills | Status: DC

## 2017-07-27 NOTE — Telephone Encounter (Signed)
[facility] sent fax requesting   simvastatin (ZOCOR) 20 MG tablet  Qty: 90

## 2017-08-02 ENCOUNTER — Telehealth: Payer: Self-pay | Admitting: Physician Assistant

## 2017-08-02 NOTE — Telephone Encounter (Signed)
Pt informed and voiced understanding of results. 

## 2017-08-02 NOTE — Telephone Encounter (Signed)
Pt called this morning stating his fasting blood sugars in the mornings have been running between 136-206 since Dec. 11.  He has not done anything different and needs to know if he needs to adjust his medications.  He is on Metformin and Glipizide.  Please call him  [phone]

## 2017-08-02 NOTE — Telephone Encounter (Signed)
I would recommend him come in for OV for a1c check and to discuss options.

## 2017-08-08 ENCOUNTER — Ambulatory Visit (INDEPENDENT_AMBULATORY_CARE_PROVIDER_SITE_OTHER): Payer: PPO | Admitting: Physician Assistant

## 2017-08-08 ENCOUNTER — Encounter: Payer: Self-pay | Admitting: Physician Assistant

## 2017-08-08 VITALS — BP 148/80 | HR 61 | Temp 97.7°F | Resp 16 | Wt 241.4 lb

## 2017-08-08 DIAGNOSIS — E119 Type 2 diabetes mellitus without complications: Secondary | ICD-10-CM | POA: Diagnosis not present

## 2017-08-08 LAB — POCT GLYCOSYLATED HEMOGLOBIN (HGB A1C)
Est. average glucose Bld gHb Est-mCnc: 151
HEMOGLOBIN A1C: 6.9

## 2017-08-08 MED ORDER — ONETOUCH ULTRA 2 W/DEVICE KIT: PACK | 0 refills | Status: DC

## 2017-08-08 NOTE — Progress Notes (Signed)
Patient: Ivan Kennedy Male    DOB: Jan 19, 1946   71 y.o.   MRN: [id number] Visit Date: 08/08/2017  Today's Provider: Mar Daring, PA-C   Chief Complaint  Patient presents with  . Hyperglycemia   Subjective:    HPI Patient is here today with c/o Hyperglycemia.Patient  Called on 12/20 that his fasting sugars levels reading have been running between 136-206 since 12/11. Patient has type 2 diabetes mellitus. Last A1C was 7.0. Patient is currently on Glipizide and Metformin. Per patient this week his sugar levels are stable in the 130's. Per patient his sugar level this morning was 136.  He denies any URI symptoms during the 2 weeks when his sugar was running up. He reports for 2 weeks his sugars were running in the 180s-210s. He had just recently changed the batteries in his meter prior to the abnormal readings. He reports over the last week his readings have been normal again, 130s-140s.    No Known Allergies   Current Outpatient Medications:  .  aspirin EC 81 MG tablet, Take by mouth., Disp: , Rfl:  .  diphenoxylate-atropine (LOMOTIL) 2.5-0.025 MG tablet, TAKE 1 TABLET BY MOUTH TWICE A DAY, Disp: 60 tablet, Rfl: 5 .  doxycycline (ADOXA) 50 MG tablet, Take 1 tablet (50 mg total) by mouth daily., Disp: 90 tablet, Rfl: 1 .  glipiZIDE (GLUCOTROL XL) 5 MG 24 hr tablet, Take 1 tablet (5 mg total) by mouth 2 (two) times daily., Disp: 180 tablet, Rfl: 1 .  loratadine-pseudoephedrine (CLARITIN-D 24-HOUR) 10-240 MG per 24 hr tablet, Take 1 tablet by mouth daily as needed for allergies., Disp: , Rfl:  .  losartan-hydrochlorothiazide (HYZAAR) 100-12.5 MG tablet, Take 1 tablet by mouth daily., Disp: 90 tablet, Rfl: 3 .  metFORMIN (GLUCOPHAGE) 1000 MG tablet, Take 1 tablet (1,000 mg total) by mouth 2 (two) times daily., Disp: 180 tablet, Rfl: 1 .  metoprolol succinate (TOPROL-XL) 25 MG 24 hr tablet, 1 TABLET ER 24HR, ORAL, EVERY DAY, Disp: 90 tablet, Rfl: 3 .  metroNIDAZOLE (METROGEL)  1 % gel, APPLY TO ROSACEA ONCE DAILY., Disp: , Rfl: 5 .  omeprazole (PRILOSEC) 40 MG capsule, Take 1 capsule (40 mg total) by mouth daily., Disp: 90 capsule, Rfl: 3 .  ONE TOUCH ULTRA TEST test strip, USE TO TEST ONCE DAILY AS DIRECTED, Disp: 100 each, Rfl: 3 .  simvastatin (ZOCOR) 20 MG tablet, Take 1 tablet (20 mg total) by mouth every evening., Disp: 90 tablet, Rfl: 3 .  vitamin B-12 (CYANOCOBALAMIN) 1000 MCG tablet, Take 1,000 mcg by mouth daily., Disp: , Rfl:   Review of Systems  Constitutional: Negative for activity change, appetite change, fatigue, fever and unexpected weight change.  HENT: Negative for congestion, ear pain, postnasal drip, rhinorrhea, sinus pressure, sinus pain, sneezing, sore throat and trouble swallowing.   Eyes: Negative for visual disturbance.  Respiratory: Negative for cough, chest tightness, shortness of breath and wheezing.   Cardiovascular: Negative for chest pain, palpitations and leg swelling.  Gastrointestinal: Negative for abdominal pain, constipation, diarrhea and nausea.  Endocrine: Negative for polydipsia, polyphagia and polyuria.  Genitourinary: Negative for discharge, dysuria, flank pain and frequency.  Neurological: Negative for dizziness, weakness, light-headedness, numbness and headaches.    Social History   Tobacco Use  . Smoking status: Never Smoker  . Smokeless tobacco: Never Used  Substance Use Topics  . Alcohol use: Yes    Alcohol/week: 0.0 oz    Comment: occasionally- beer 1/month  Objective:   BP (!) 148/80 (BP Location: Left Arm, Patient Position: Sitting, Cuff Size: Normal)   Pulse 61   Temp 97.7 F (36.5 C) (Oral)   Resp 16   Wt 241 lb 6.4 oz (109.5 kg)   SpO2 98%   BMI 31.85 kg/m    Physical Exam  Constitutional: He appears well-developed and well-nourished. No distress.  HENT:  Head: Normocephalic and atraumatic.  Neck: Normal range of motion. Neck supple.  Cardiovascular: Normal rate, regular rhythm and normal  heart sounds. Exam reveals no gallop and no friction rub.  No murmur heard. Pulmonary/Chest: Effort normal and breath sounds normal. No respiratory distress. He has no wheezes. He has no rales.  Skin: He is not diaphoretic.  Vitals reviewed.       Assessment & Plan:     1. Type 2 diabetes mellitus without complication, without long-term current use of insulin (HCC) A1c stable at 6.9. Unable to obtain random glucose today due to strips being expired. Possibly patient had some viral infection that fluctuated blood sugars vs old meter. Current glucometer is over 58 years old. Has not been calibrated or checked with control. Will order him a new meter as below. He is to call if readings are still running elevated.  - POCT glycosylated hemoglobin (Hb A1C) - Blood Glucose Monitoring Suppl (ONE TOUCH ULTRA 2) w/Device KIT; To check blood sugar once daily  Dispense: 1 each; Refill: 0       Mar Daring, PA-C  [facility]

## 2017-08-16 ENCOUNTER — Other Ambulatory Visit: Payer: Self-pay

## 2017-08-16 DIAGNOSIS — E119 Type 2 diabetes mellitus without complications: Secondary | ICD-10-CM

## 2017-08-16 MED ORDER — ACCU-CHEK SOFT TOUCH LANCETS MISC: 12 refills | Status: DC

## 2017-08-21 ENCOUNTER — Other Ambulatory Visit: Payer: Self-pay

## 2017-08-21 DIAGNOSIS — E119 Type 2 diabetes mellitus without complications: Secondary | ICD-10-CM

## 2017-08-21 MED ORDER — GLUCOSE BLOOD VI STRP: ORAL_STRIP | 3 refills | Status: DC

## 2017-08-21 NOTE — Telephone Encounter (Signed)
Refill request from Ivan Kennedy.  One touch Ultra Blue Tests (NEW)100 Qty:100

## 2017-08-22 ENCOUNTER — Telehealth: Payer: Self-pay | Admitting: Physician Assistant

## 2017-08-22 DIAGNOSIS — E119 Type 2 diabetes mellitus without complications: Secondary | ICD-10-CM

## 2017-08-22 MED ORDER — ACCU-CHEK SOFT TOUCH LANCETS MISC: 12 refills | Status: AC

## 2017-08-22 NOTE — Telephone Encounter (Signed)
I see that it was sent to [facility] in [place]?

## 2017-08-22 NOTE — Telephone Encounter (Signed)
Fixed.

## 2017-08-22 NOTE — Telephone Encounter (Signed)
Patient states that we sent lancets for his glucose meter to the wrong pharmacy.  He would like them sent to [facility] in [place].

## 2017-09-24 ENCOUNTER — Telehealth: Payer: Self-pay | Admitting: Physician Assistant

## 2017-09-24 NOTE — Telephone Encounter (Signed)
Pt returned call and is scheduled for AWV on 09/27/17. I spoke with pt about CPE /AWV F/U and moved his appt that was scheduled for 10/25/17 to 10/26/17 @ 10am. Thanks TNP

## 2017-09-27 ENCOUNTER — Ambulatory Visit (INDEPENDENT_AMBULATORY_CARE_PROVIDER_SITE_OTHER): Payer: PPO

## 2017-09-27 VITALS — BP 130/72 | HR 64 | Temp 97.6°F | Ht 73.0 in | Wt 247.0 lb

## 2017-09-27 DIAGNOSIS — Z0000 Encounter for general adult medical examination without abnormal findings: Secondary | ICD-10-CM

## 2017-09-27 NOTE — Progress Notes (Signed)
Subjective:   Ivan Kennedy is a 72 y.o. male who presents for Medicare Annual/Subsequent preventive examination.  Review of Systems:  N/A  Cardiac Risk Factors include: advanced age (>58mn, >>11women);diabetes mellitus;dyslipidemia;hypertension;male gender;obesity (BMI >30kg/m2)     Objective:    Vitals: BP 130/72 (BP Location: Left Arm)   Pulse 64   Temp 97.6 F (36.4 C) (Oral)   Ht 6' 1"  (1.854 m)   Wt 247 lb (112 kg)   BMI 32.59 kg/m   Body mass index is 32.59 kg/m.  Advanced Directives 09/27/2017 10/11/2016 07/19/2015 01/21/2015  Does Patient Have a Medical Advance Directive? Yes Yes No No  Type of Advance Directive Healthcare Power of APatrick Kennedy Chart? No - copy requested No - copy requested - -  Would patient like information on creating a medical advance directive? - - - No - patient declined information    Tobacco Social History   Tobacco Use  Smoking Status Never Smoker  Smokeless Tobacco Never Used     Counseling given: Not Answered   Clinical Intake:  Pre-visit preparation completed: Yes  Pain : No/denies pain Pain Score: 0-No pain     Nutritional Status: BMI > 30  Obese Nutritional Risks: None Diabetes: Yes(type 2) CBG done?: No Did pt. bring in CBG monitor from home?: No  How often do you need to have someone help you when you read instructions, pamphlets, or other written materials from your doctor or pharmacy?: 1 - Never  Interpreter Needed?: No  Information [facility] :: [facility] L[facility]  Past Medical History:  Diagnosis Date  . Colon polyp 2005  . Diabetes mellitus ([place]   . Hyperlipidemia   . Hypertension   . Rosacea   . Sleep apnea    Past Surgical History:  Procedure Laterality Date  . ANAL FISSURE REPAIR  1998  . COLONOSCOPY  2005, 2010  . 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 HEarnestine Leys . shoulder spurs Right 2000   Family  History  Problem Relation Age of Onset  . Diabetes Mother    Social History   Socioeconomic History  . Marital status: Married    Spouse name: None  . Number of children: 1  . Years of education: None  . Highest education level: GED or equivalent  Social Needs  . Financial resource strain: Not hard at all  . Food insecurity - worry: Never true  . Food insecurity - inability: Never true  . Transportation needs - medical: No  . Transportation needs - non-medical: No  Occupational History  . Occupation: retired  Tobacco Use  . Smoking status: Never Smoker  . Smokeless tobacco: Never Used  Substance and Sexual Activity  . Alcohol use: Yes    Alcohol/week: 0.0 oz    Comment: occasionally- beer 1-2/month  . Drug use: No  . Sexual activity: None  Other Topics Concern  . None  Social History Narrative  . None    Outpatient Encounter Medications as of 09/27/2017  Medication Sig  . aspirin EC 81 MG tablet Take 81 mg by mouth daily.   . Blood Glucose Monitoring Suppl (ONE TOUCH ULTRA 2) w/Device KIT To check blood sugar once daily  . diphenoxylate-atropine (LOMOTIL) 2.5-0.025 MG tablet TAKE 1 TABLET BY MOUTH TWICE A DAY  . doxycycline (ADOXA) 50 MG tablet Take 1 tablet (50 mg total) by mouth daily.  .Marland KitchenglipiZIDE (GLUCOTROL  XL) 5 MG 24 hr tablet Take 1 tablet (5 mg total) by mouth 2 (two) times daily.  Marland Kitchen glucose blood (ONE TOUCH ULTRA TEST) test strip To check blood sugar once daily  . Lancets (ACCU-CHEK SOFT TOUCH) lancets To check blood glucose daily  . loratadine-pseudoephedrine (CLARITIN-D 24-HOUR) 10-240 MG per 24 hr tablet Take 1 tablet by mouth daily as needed for allergies.  Marland Kitchen losartan-hydrochlorothiazide (HYZAAR) 100-12.5 MG tablet Take 1 tablet by mouth daily.  . metFORMIN (GLUCOPHAGE) 1000 MG tablet Take 1 tablet (1,000 mg total) by mouth 2 (two) times daily.  . metoprolol succinate (TOPROL-XL) 25 MG 24 hr tablet 1 TABLET ER 24HR, ORAL, EVERY DAY  . metroNIDAZOLE  (METROGEL) 1 % gel APPLY TO ROSACEA ONCE DAILY.  Marland Kitchen omeprazole (PRILOSEC) 40 MG capsule Take 1 capsule (40 mg total) by mouth daily.  . simvastatin (ZOCOR) 20 MG tablet Take 1 tablet (20 mg total) by mouth every evening.  . tretinoin (RETIN-A) 0.05 % cream Apply 1 application topically at bedtime.   . vitamin B-12 (CYANOCOBALAMIN) 1000 MCG tablet Take 1,000 mcg by mouth daily.   No facility-administered encounter medications on file as of 09/27/2017.     Activities of Daily Living In your present state of health, do you have any difficulty performing the following activities: 09/27/2017 10/11/2016  Hearing? N N  Vision? N N  Difficulty concentrating or making decisions? N N  Walking or climbing stairs? N N  Dressing or bathing? N N  Doing errands, shopping? N N  Preparing Food and eating ? N N  Using the Toilet? N N  In the past six months, have you accidently leaked urine? N N  Do you have problems with loss of bowel control? N N  Managing your Medications? N N  Managing your Finances? N N  Housekeeping or managing your Housekeeping? N N  Some recent data might be hidden    Patient Care Team: Mar Daring, PA-C as PCP - General (Family Medicine) Arelia Sneddon, OD as Consulting Physician (Optometry) Jannet Mantis, MD as Consulting Physician (Dermatology)   Assessment:   This is a routine wellness examination for Ivan Kennedy.  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 DIET - INCREASE WATER INTAKE     Recommend increasing water intake to 4-6 glasses a day.       Fall Risk Fall Risk  09/27/2017 04/26/2017 10/25/2016 10/11/2016 06/08/2016  Falls in the past year? No No No No No   Is the patient's home free of loose throw rugs in walkways, pet beds, electrical cords, etc?   yes      Grab bars in  the bathroom? no      Handrails on the stairs?   yes      Adequate lighting?   yes  Timed Get Up and Go Performed: N/A  Depression Screen PHQ 2/9 Scores 09/27/2017 10/11/2016 06/08/2016 01/21/2015  PHQ - 2 Score 0 0 0 0    Cognitive Function: Pt declined screening today.     6CIT Screen 10/11/2016  What Year? 0 points  What month? 0 points  What time? 0 points  Count back from 20 0 points  Months in reverse 0 points  Repeat phrase 2 points  Total Score 2    Immunization History  Administered Date(s) Administered  . Hepatitis B, adult  05/29/2012, 07/29/2012, 03/18/2013  . Influenza, High Dose Seasonal PF 07/06/2014, 05/06/2015, 07/11/2016, 04/26/2017  . Pneumococcal Conjugate-13 07/06/2014  . Pneumococcal Polysaccharide-23 05/29/2012    Qualifies for Shingles Vaccine? Due for Shingles vaccine. Declined my offer to administer today. Education has been provided regarding the importance of this vaccine. Pt has been advised to call her insurance company to determine her out of pocket expense. Advised she may also receive this vaccine at her local pharmacy or Health Dept. Verbalized acceptance and understanding.  Screening Tests Health Maintenance  Topic Date Due  . Hepatitis C Screening  1945-08-24  . TETANUS/TDAP  12/23/1966  . PNA vac Low Risk Adult (2 of 2 - PPSV23) 05/29/2017  . OPHTHALMOLOGY EXAM  12/28/2017  . HEMOGLOBIN A1C  02/06/2018  . FOOT EXAM  04/26/2018  . COLONOSCOPY  10/28/2024  . INFLUENZA VACCINE  Completed   Cancer Screenings: Lung: Low Dose CT Chest recommended if Age 78-80 years, 30 pack-year currently smoking OR have quit w/in 15years. Patient does not qualify. Colorectal: Up to date  Additional Screenings:  Hepatitis B/HIV/Syphillis: Pt declines today.  Hepatitis C Screening: Pt declines today.     Plan:  I have personally reviewed and addressed the Medicare Annual Wellness questionnaire and have noted the following in the patient's chart:   A. Medical and social history B. Use of alcohol, tobacco or illicit drugs  C. Current medications and supplements D. Functional ability and status E.  Nutritional status F.  Physical activity G. Advance directives H. List of other physicians I.  Hospitalizations, surgeries, and ER visits in previous 12 months J.  [place] such as hearing and vision if needed, cognitive and depression L. Referrals and appointments - none  In addition, I have reviewed and discussed with patient certain preventive protocols, quality metrics, and best practice recommendations. A written personalized care plan for preventive services as well as general preventive health recommendations were provided to patient.  See attached scanned questionnaire for additional information.   Signed,  Fabio Neighbors, LPN Nurse Health Advisor   Nurse Recommendations: Pt needs the Hepatitis C lab added to his next apts blood work. Pt wanted all blood work completed together. Pt declined the tetanus and Pneumovax 23 vaccines today.

## 2017-09-27 NOTE — Patient Instructions (Signed)
Ivan Kennedy , Thank you for taking time to come for your Medicare Wellness Visit. I appreciate your ongoing commitment to your health goals. Please review the following plan we discussed and let me know if I can assist you in the future.   Screening recommendations/referrals: Colonoscopy: Up to date Recommended yearly ophthalmology/optometry visit for glaucoma screening and checkup Recommended yearly dental visit for hygiene and checkup  Vaccinations: Influenza vaccine: Up to date Pneumococcal vaccine: Prevnar 13 up to date, declined Pneumovax 23 today. Tdap vaccine: Pt declines today.  Shingles vaccine: Pt declines today.     Advanced directives: Please bring a copy of your POA (Power of Attorney) and/or Living Will to your next appointment.   Conditions/risks identified: Obesity- recommend increasing water intake to 4-6 glasses a day.  Next appointment: 10/26/17 @ 10:00 AM  Preventive Care 65 Years and Older, Male Preventive care refers to lifestyle choices and visits with your health care provider that can promote health and wellness. What does preventive care include?  A yearly physical exam. This is also called an annual well check.  Dental exams once or twice a year.  Routine eye exams. Ask your health care provider how often you should have your eyes checked.  Personal lifestyle choices, including:  Daily care of your teeth and gums.  Regular physical activity.  Eating a healthy diet.  Avoiding tobacco and drug use.  Limiting alcohol use.  Practicing safe sex.  Taking low doses of aspirin every day.  Taking vitamin and mineral supplements as recommended by your health care provider. What happens during an annual well check? The services and screenings done by your health care provider during your annual well check will depend on your age, overall health, lifestyle risk factors, and family history of disease. Counseling  Your health care provider may ask you  questions about your:  Alcohol use.  Tobacco use.  Drug use.  Emotional well-being.  Home and relationship well-being.  Sexual activity.  Eating habits.  History of falls.  Memory and ability to understand (cognition).  Work and work Statistician. Screening  You may have the following tests or measurements:  Height, weight, and BMI.  Blood pressure.  Lipid and cholesterol levels. These may be checked every 5 years, or more frequently if you are over 34 years old.  Skin check.  Lung cancer screening. You may have this screening every year starting at age 37 if you have a 30-pack-year history of smoking and currently smoke or have quit within the past 15 years.  Fecal occult blood test (FOBT) of the stool. You may have this test every year starting at age 61.  Flexible sigmoidoscopy or colonoscopy. You may have a sigmoidoscopy every 5 years or a colonoscopy every 10 years starting at age 78.  Prostate cancer screening. Recommendations will vary depending on your family history and other risks.  Hepatitis C blood test.  Hepatitis B blood test.  Sexually transmitted disease (STD) testing.  Diabetes screening. This is done by checking your blood sugar (glucose) after you have not eaten for a while (fasting). You may have this done every 1-3 years.  Abdominal aortic aneurysm (AAA) screening. You may need this if you are a current or former smoker.  Osteoporosis. You may be screened starting at age 76 if you are at high risk. Talk with your health care provider about your test results, treatment options, and if necessary, the need for more tests. Vaccines  Your health care provider may recommend certain  vaccines, such as:  Influenza vaccine. This is recommended every year.  Tetanus, diphtheria, and acellular pertussis (Tdap, Td) vaccine. You may need a Td booster every 10 years.  Zoster vaccine. You may need this after age 3.  Pneumococcal 13-valent conjugate  (PCV13) vaccine. One dose is recommended after age 76.  Pneumococcal polysaccharide (PPSV23) vaccine. One dose is recommended after age 48. Talk to your health care provider about which screenings and vaccines you need and how often you need them. This information is not intended to replace advice given to you by your health care provider. Make sure you discuss any questions you have with your health care provider. Document Released: 08/27/2015 Document Revised: 04/19/2016 Document Reviewed: 06/01/2015 Elsevier Interactive Patient Education  2017 [place] Prevention in the Home Falls can cause injuries. They can happen to people of all ages. There are many things you can do to make your home safe and to help prevent falls. What can I do on the outside of my home?  Regularly fix the edges of walkways and driveways and fix any cracks.  Remove anything that might make you trip as you walk through a door, such as a raised step or threshold.  Trim any bushes or trees on the path to your home.  Use bright outdoor lighting.  Clear any walking paths of anything that might make someone trip, such as rocks or tools.  Regularly check to see if handrails are loose or broken. Make sure that both sides of any steps have handrails.  Any raised decks and porches should have guardrails on the edges.  Have any leaves, snow, or ice cleared regularly.  Use sand or salt on walking paths during winter.  Clean up any spills in your garage right away. This includes oil or grease spills. What can I do in the bathroom?  Use night lights.  Install grab bars by the toilet and in the tub and shower. Do not use towel bars as grab bars.  Use non-skid mats or decals in the tub or shower.  If you need to sit down in the shower, use a plastic, non-slip stool.  Keep the floor dry. Clean up any water that spills on the floor as soon as it happens.  Remove soap buildup in the tub or shower  regularly.  Attach bath mats securely with double-sided non-slip rug tape.  Do not have throw rugs and other things on the floor that can make you trip. What can I do in the bedroom?  Use night lights.  Make sure that you have a light by your bed that is easy to reach.  Do not use any sheets or blankets that are too big for your bed. They should not hang down onto the floor.  Have a firm chair that has side arms. You can use this for support while you get dressed.  Do not have throw rugs and other things on the floor that can make you trip. What can I do in the kitchen?  Clean up any spills right away.  Avoid walking on wet floors.  Keep items that you use a lot in easy-to-reach places.  If you need to reach something above you, use a strong step stool that has a grab bar.  Keep electrical cords out of the way.  Do not use floor polish or wax that makes floors slippery. If you must use wax, use non-skid floor wax.  Do not have throw rugs and other things  on the floor that can make you trip. What can I do with my stairs?  Do not leave any items on the stairs.  Make sure that there are handrails on both sides of the stairs and use them. Fix handrails that are broken or loose. Make sure that handrails are as long as the stairways.  Check any carpeting to make sure that it is firmly attached to the stairs. Fix any carpet that is loose or worn.  Avoid having throw rugs at the top or bottom of the stairs. If you do have throw rugs, attach them to the floor with carpet tape.  Make sure that you have a light switch at the top of the stairs and the bottom of the stairs. If you do not have them, ask someone to add them for you. What else can I do to help prevent falls?  Wear shoes that:  Do not have high heels.  Have rubber bottoms.  Are comfortable and fit you well.  Are closed at the toe. Do not wear sandals.  If you use a stepladder:  Make sure that it is fully  opened. Do not climb a closed stepladder.  Make sure that both sides of the stepladder are locked into place.  Ask someone to hold it for you, if possible.  Clearly mark and make sure that you can see:  Any grab bars or handrails.  First and last steps.  Where the edge of each step is.  Use tools that help you move around (mobility aids) if they are needed. These include:  Canes.  Walkers.  Scooters.  Crutches.  Turn on the lights when you go into a dark area. Replace any light bulbs as soon as they burn out.  Set up your furniture so you have a clear path. Avoid moving your furniture around.  If any of your floors are uneven, fix them.  If there are any pets around you, be aware of where they are.  Review your medicines with your doctor. Some medicines can make you feel dizzy. This can increase your chance of falling. Ask your doctor what other things that you can do to help prevent falls. This information is not intended to replace advice given to you by your health care provider. Make sure you discuss any questions you have with your health care provider. Document Released: 05/27/2009 Document Revised: 01/06/2016 Document Reviewed: 09/04/2014 Elsevier Interactive Patient Education  2017 [employer].

## 2017-10-25 ENCOUNTER — Ambulatory Visit: Payer: Self-pay | Admitting: Physician Assistant

## 2017-10-26 ENCOUNTER — Ambulatory Visit (INDEPENDENT_AMBULATORY_CARE_PROVIDER_SITE_OTHER): Payer: PPO | Admitting: Physician Assistant

## 2017-10-26 ENCOUNTER — Encounter: Payer: Self-pay | Admitting: Physician Assistant

## 2017-10-26 VITALS — BP 132/76 | HR 60 | Temp 97.9°F | Resp 16 | Ht 73.0 in | Wt 241.0 lb

## 2017-10-26 DIAGNOSIS — E119 Type 2 diabetes mellitus without complications: Secondary | ICD-10-CM | POA: Diagnosis not present

## 2017-10-26 DIAGNOSIS — Z0000 Encounter for general adult medical examination without abnormal findings: Secondary | ICD-10-CM | POA: Diagnosis not present

## 2017-10-26 DIAGNOSIS — E7800 Pure hypercholesterolemia, unspecified: Secondary | ICD-10-CM | POA: Diagnosis not present

## 2017-10-26 DIAGNOSIS — Z1159 Encounter for screening for other viral diseases: Secondary | ICD-10-CM

## 2017-10-26 DIAGNOSIS — I10 Essential (primary) hypertension: Secondary | ICD-10-CM

## 2017-10-26 DIAGNOSIS — K760 Fatty (change of) liver, not elsewhere classified: Secondary | ICD-10-CM | POA: Diagnosis not present

## 2017-10-26 NOTE — Patient Instructions (Signed)

## 2017-10-26 NOTE — Progress Notes (Signed)
Patient: Ivan Kennedy Male    DOB: [birth year]   72 y.o.   MRN: [id number] Visit Date: 10/26/2017  Today's Provider: Mar Daring, PA-C   Chief Complaint  Patient presents with  . Hypertension  . Diabetes   Subjective:    HPI  Patient saw Alyson Ingles for [place] on 09/27/17.    Hypertension, follow-up:  BP Readings from Last 3 Encounters:  10/26/17 132/76  09/27/17 130/72  08/08/17 (!) 148/80    He was last seen for hypertension 6 months ago.  BP at that visit was 128/78. Management since that visit includes no changes. He reports good compliance with treatment. He is not having side effects.  He is not exercising. He is adherent to low salt diet.   Outside blood pressures are checked occasionally. He is experiencing none.  Patient denies exertional chest pressure/discomfort, lower extremity edema and palpitations.   Cardiovascular risk factors include diabetes mellitus.   Weight trend: stable Wt Readings from Last 3 Encounters:  10/26/17 241 lb (109.3 kg)  09/27/17 247 lb (112 kg)  08/08/17 241 lb 6.4 oz (109.5 kg)    Current diet: well balanced    Diabetes Mellitus Type II, Follow-up:   Lab Results  Component Value Date   HGBA1C 6.9 08/08/2017   HGBA1C 7.0 04/26/2017   HGBA1C 7.0 10/25/2016    Last seen for diabetes 6 months ago.  Management since then includes no changes. He reports good compliance with treatment. He is not having side effects.  Current symptoms include none and have been stable. Home blood sugar records: trend: fluctuating a bit  Episodes of hypoglycemia? no   Current Insulin Regimen: none Most Recent Eye Exam: up to date Weight trend: stable Prior visit with dietician: no Current diet: well balanced Current exercise: no regular exercise  Pertinent Labs:    Component Value Date/Time   CHOL 91 04/26/2017 0902   CHOL 102 04/04/2016 0851   TRIG 72 04/26/2017 0902   HDL 38 (L) 04/26/2017 0902   HDL 38 (L)  04/04/2016 0851   LDLCALC 38 04/26/2017 0902   CREATININE 1.00 04/26/2017 0902    Wt Readings from Last 3 Encounters:  10/26/17 241 lb (109.3 kg)  09/27/17 247 lb (112 kg)  08/08/17 241 lb 6.4 oz (109.5 kg)       No Known Allergies   Current Outpatient Medications:  .  aspirin EC 81 MG tablet, Take 81 mg by mouth daily. , Disp: , Rfl:  .  Blood Glucose Monitoring Suppl (ONE TOUCH ULTRA 2) w/Device KIT, To check blood sugar once daily, Disp: 1 each, Rfl: 0 .  diphenoxylate-atropine (LOMOTIL) 2.5-0.025 MG tablet, TAKE 1 TABLET BY MOUTH TWICE A DAY, Disp: 60 tablet, Rfl: 5 .  doxycycline (ADOXA) 50 MG tablet, Take 1 tablet (50 mg total) by mouth daily., Disp: 90 tablet, Rfl: 1 .  glipiZIDE (GLUCOTROL XL) 5 MG 24 hr tablet, Take 1 tablet (5 mg total) by mouth 2 (two) times daily., Disp: 180 tablet, Rfl: 1 .  glucose blood (ONE TOUCH ULTRA TEST) test strip, To check blood sugar once daily, Disp: 100 each, Rfl: 3 .  Lancets (ACCU-CHEK SOFT TOUCH) lancets, To check blood glucose daily, Disp: 100 each, Rfl: 12 .  loratadine-pseudoephedrine (CLARITIN-D 24-HOUR) 10-240 MG per 24 hr tablet, Take 1 tablet by mouth daily as needed for allergies., Disp: , Rfl:  .  losartan-hydrochlorothiazide (HYZAAR) 100-12.5 MG tablet, Take 1 tablet by mouth daily., Disp: 90  tablet, Rfl: 3 .  metFORMIN (GLUCOPHAGE) 1000 MG tablet, Take 1 tablet (1,000 mg total) by mouth 2 (two) times daily., Disp: 180 tablet, Rfl: 1 .  metoprolol succinate (TOPROL-XL) 25 MG 24 hr tablet, 1 TABLET ER 24HR, ORAL, EVERY DAY, Disp: 90 tablet, Rfl: 3 .  metroNIDAZOLE (METROGEL) 1 % gel, APPLY TO ROSACEA ONCE DAILY., Disp: , Rfl: 5 .  omeprazole (PRILOSEC) 40 MG capsule, Take 1 capsule (40 mg total) by mouth daily., Disp: 90 capsule, Rfl: 3 .  simvastatin (ZOCOR) 20 MG tablet, Take 1 tablet (20 mg total) by mouth every evening., Disp: 90 tablet, Rfl: 3 .  tretinoin (RETIN-A) 0.05 % cream, Apply 1 application topically at bedtime. ,  Disp: , Rfl: 5 .  vitamin B-12 (CYANOCOBALAMIN) 1000 MCG tablet, Take 1,000 mcg by mouth daily., Disp: , Rfl:   Review of Systems  Constitutional: Negative.   Respiratory: Negative.   Cardiovascular: Negative.   Endocrine: Negative.   Musculoskeletal: Negative.   Neurological: Negative.     Social History   Tobacco Use  . Smoking status: Never Smoker  . Smokeless tobacco: Never Used  Substance Use Topics  . Alcohol use: Yes    Alcohol/week: 0.0 oz    Comment: occasionally- beer 1-2/month   Objective:   BP 132/76   Pulse 60   Temp 97.9 F (36.6 C)   Resp 16   Ht 6' 1"  (1.854 m)   Wt 241 lb (109.3 kg)   BMI 31.80 kg/m  Vitals:   10/26/17 1004  BP: 132/76  Pulse: 60  Resp: 16  Temp: 97.9 F (36.6 C)  Weight: 241 lb (109.3 kg)  Height: 6' 1"  (1.854 m)     Physical Exam  Constitutional: He is oriented to person, place, and time. He appears well-developed and well-nourished.  HENT:  Head: Normocephalic and atraumatic.  Right Ear: Hearing, tympanic membrane, external ear and ear canal normal.  Left Ear: Hearing, tympanic membrane, external ear and ear canal normal.  Nose: Nose normal.  Mouth/Throat: Uvula is midline, oropharynx is clear and moist and mucous membranes are normal.  Eyes: Conjunctivae and EOM are normal. Pupils are equal, round, and reactive to light. Right eye exhibits no discharge.  Neck: Normal range of motion. Neck supple. Carotid bruit is not present. No tracheal deviation present. No thyromegaly present.  Cardiovascular: Normal rate, regular rhythm, normal heart sounds and intact distal pulses.  No murmur heard. Pulmonary/Chest: Effort normal and breath sounds normal. No respiratory distress. He has no wheezes. He has no rales. He exhibits no tenderness.  Abdominal: Soft. He exhibits no distension and no mass. There is no tenderness. There is no rebound and no guarding.  Genitourinary:  Genitourinary Comments: Deferred per patient    Musculoskeletal: Normal range of motion. He exhibits no edema or tenderness.  Lymphadenopathy:    He has no cervical adenopathy.  Neurological: He is alert and oriented to person, place, and time. He has normal reflexes. No cranial nerve deficit. He exhibits normal muscle tone. Coordination normal.  Skin: Skin is warm and dry. No rash noted. No erythema.  Psychiatric: He has a normal mood and affect. His behavior is normal. Judgment and thought content normal.        Assessment & Plan:     1. Annual physical exam Normal physical exam today. Will check labs as below and f/u pending lab results. If labs are stable and WNL he will not need to have these rechecked for one year at  his next annual physical exam. He is to call the office in the meantime if he has any acute issue, questions or concerns. - CBC w/Diff/Platelet - Comprehensive Metabolic Panel (CMET) - TSH - HgB A1c - Lipid Profile  2. Essential hypertension Stable. Continue Metoprolol 17m daily, losartan-hctz 100-12.575m Will check labs as below and f/u pending results. - CBC w/Diff/Platelet - Comprehensive Metabolic Panel (CMET) - HgB A1c - Lipid Profile  3. Fatty liver disease, nonalcoholic Stable. Will check labs as below and f/u pending results. - CBC w/Diff/Platelet - Comprehensive Metabolic Panel (CMET) - HgB A1c - Lipid Profile  4. Pure hypercholesterolemia Stable. Continue simvastatin 2066mWill check labs as below and f/u pending results. - CBC w/Diff/Platelet - Comprehensive Metabolic Panel (CMET) - HgB A1c - Lipid Profile  5. Type 2 diabetes mellitus without complication, without long-term current use of insulin (HCC) Stable. Continue Metformin 1000m51mD, glipizide 5mg.27mll check labs as below and f/u pending results. - CBC w/Diff/Platelet - Comprehensive Metabolic Panel (CMET) - HgB A1c - Lipid Profile  6. Need for hepatitis C screening test - Hepatitis C Antibody       JenniMar Daring-C  [facility]

## 2017-10-30 DIAGNOSIS — E119 Type 2 diabetes mellitus without complications: Secondary | ICD-10-CM | POA: Diagnosis not present

## 2017-10-30 DIAGNOSIS — Z0000 Encounter for general adult medical examination without abnormal findings: Secondary | ICD-10-CM | POA: Diagnosis not present

## 2017-10-30 DIAGNOSIS — K760 Fatty (change of) liver, not elsewhere classified: Secondary | ICD-10-CM | POA: Diagnosis not present

## 2017-10-30 DIAGNOSIS — Z1159 Encounter for screening for other viral diseases: Secondary | ICD-10-CM | POA: Diagnosis not present

## 2017-10-30 DIAGNOSIS — E7800 Pure hypercholesterolemia, unspecified: Secondary | ICD-10-CM | POA: Diagnosis not present

## 2017-10-30 DIAGNOSIS — I10 Essential (primary) hypertension: Secondary | ICD-10-CM | POA: Diagnosis not present

## 2017-10-31 LAB — CBC WITH DIFFERENTIAL/PLATELET
BASOS ABS: 0 10*3/uL (ref 0.0–0.2)
BASOS: 0 %
EOS (ABSOLUTE): 0.1 10*3/uL (ref 0.0–0.4)
Eos: 2 %
Hematocrit: 41.6 % (ref 37.5–51.0)
Hemoglobin: 14.2 g/dL (ref 13.0–17.7)
IMMATURE GRANS (ABS): 0 10*3/uL (ref 0.0–0.1)
IMMATURE GRANULOCYTES: 0 %
LYMPHS: 26 %
Lymphocytes Absolute: 1.4 10*3/uL (ref 0.7–3.1)
MCH: 30 pg (ref 26.6–33.0)
MCHC: 34.1 g/dL (ref 31.5–35.7)
MCV: 88 fL (ref 79–97)
MONOS ABS: 0.5 10*3/uL (ref 0.1–0.9)
Monocytes: 9 %
NEUTROS PCT: 63 %
Neutrophils Absolute: 3.4 10*3/uL (ref 1.4–7.0)
PLATELETS: 145 10*3/uL — AB (ref 150–379)
RBC: 4.73 x10E6/uL (ref 4.14–5.80)
RDW: 14.3 % (ref 12.3–15.4)
WBC: 5.4 10*3/uL (ref 3.4–10.8)

## 2017-10-31 LAB — COMPREHENSIVE METABOLIC PANEL
A/G RATIO: 2.2 (ref 1.2–2.2)
ALT: 26 IU/L (ref 0–44)
AST: 21 IU/L (ref 0–40)
Albumin: 4.3 g/dL (ref 3.6–4.8)
Alkaline Phosphatase: 57 IU/L (ref 39–117)
BUN/Creatinine Ratio: 19 (ref 10–24)
BUN: 21 mg/dL (ref 8–27)
Bilirubin Total: 1 mg/dL (ref 0.0–1.2)
CALCIUM: 9.6 mg/dL (ref 8.6–10.2)
CHLORIDE: 106 mmol/L (ref 96–106)
CO2: 20 mmol/L (ref 20–29)
Creatinine, Ser: 1.13 mg/dL (ref 0.76–1.27)
GFR calc Af Amer: 76 mL/min/{1.73_m2} (ref >59)
GFR, EST NON AFRICAN AMERICAN: 66 mL/min/{1.73_m2} (ref >59)
GLUCOSE: 149 mg/dL — AB (ref 65–99)
Globulin, Total: 2 g/dL (ref 1.5–4.5)
POTASSIUM: 3.8 mmol/L (ref 3.5–5.2)
Sodium: 147 mmol/L — ABNORMAL HIGH (ref 134–144)
TOTAL PROTEIN: 6.3 g/dL (ref 6.0–8.5)

## 2017-10-31 LAB — LIPID PANEL
Chol/HDL Ratio: 2.6 ratio (ref 0.0–5.0)
Cholesterol, Total: 110 mg/dL (ref 100–199)
HDL: 42 mg/dL (ref >39)
LDL Calculated: 50 mg/dL (ref 0–99)
Triglycerides: 90 mg/dL (ref 0–149)
VLDL Cholesterol Cal: 18 mg/dL (ref 5–40)

## 2017-10-31 LAB — HEPATITIS C ANTIBODY

## 2017-10-31 LAB — HEMOGLOBIN A1C
ESTIMATED AVERAGE GLUCOSE: 160 mg/dL
HEMOGLOBIN A1C: 7.2 % — AB (ref 4.8–5.6)

## 2017-10-31 LAB — TSH: TSH: 1.46 u[IU]/mL (ref 0.450–4.500)

## 2017-11-01 ENCOUNTER — Telehealth: Payer: Self-pay

## 2017-11-01 NOTE — Telephone Encounter (Signed)
-----   Message from Mar Daring, [place] sent at 11/01/2017 12:58 PM EDT ----- Sodium borderline high so limit salt intake in diet. A1c up slightly from 6.9 to 7.2 currently. Cholesterol doing well. Continue medications. Work on healthy dieting habits. DASH diet is good guideline. Hep C negative. Thyroid normal. Kidney and liver function normal. Blood count normal.

## 2017-11-01 NOTE — Telephone Encounter (Signed)
Patient advised as directed below.  Thanks,  -Montavious Wierzba 

## 2017-12-12 ENCOUNTER — Other Ambulatory Visit: Payer: Self-pay | Admitting: Physician Assistant

## 2017-12-12 DIAGNOSIS — E119 Type 2 diabetes mellitus without complications: Secondary | ICD-10-CM

## 2017-12-26 DIAGNOSIS — G4733 Obstructive sleep apnea (adult) (pediatric): Secondary | ICD-10-CM | POA: Diagnosis not present

## 2018-01-21 ENCOUNTER — Other Ambulatory Visit: Payer: Self-pay | Admitting: Physician Assistant

## 2018-01-21 DIAGNOSIS — K580 Irritable bowel syndrome with diarrhea: Secondary | ICD-10-CM

## 2018-01-21 MED ORDER — DIPHENOXYLATE-ATROPINE 2.5-0.025 MG PO TABS: 1.0000 | ORAL_TABLET | Freq: Two times a day (BID) | ORAL | 5 refills | Status: DC

## 2018-01-21 NOTE — Progress Notes (Signed)
Refilled Lomotil to [facility]

## 2018-02-21 LAB — HM DIABETES EYE EXAM

## 2018-02-28 ENCOUNTER — Other Ambulatory Visit: Payer: Self-pay | Admitting: Physician Assistant

## 2018-02-28 ENCOUNTER — Encounter: Payer: Self-pay | Admitting: Physician Assistant

## 2018-02-28 DIAGNOSIS — E119 Type 2 diabetes mellitus without complications: Secondary | ICD-10-CM

## 2018-03-14 DIAGNOSIS — Z85828 Personal history of other malignant neoplasm of skin: Secondary | ICD-10-CM | POA: Diagnosis not present

## 2018-03-14 DIAGNOSIS — L718 Other rosacea: Secondary | ICD-10-CM | POA: Diagnosis not present

## 2018-03-14 DIAGNOSIS — L578 Other skin changes due to chronic exposure to nonionizing radiation: Secondary | ICD-10-CM | POA: Diagnosis not present

## 2018-03-14 DIAGNOSIS — L918 Other hypertrophic disorders of the skin: Secondary | ICD-10-CM | POA: Diagnosis not present

## 2018-03-14 DIAGNOSIS — Z872 Personal history of diseases of the skin and subcutaneous tissue: Secondary | ICD-10-CM | POA: Diagnosis not present

## 2018-03-14 DIAGNOSIS — L570 Actinic keratosis: Secondary | ICD-10-CM | POA: Diagnosis not present

## 2018-03-14 DIAGNOSIS — Z1283 Encounter for screening for malignant neoplasm of skin: Secondary | ICD-10-CM | POA: Diagnosis not present

## 2018-04-24 ENCOUNTER — Ambulatory Visit (INDEPENDENT_AMBULATORY_CARE_PROVIDER_SITE_OTHER): Payer: PPO | Admitting: Physician Assistant

## 2018-04-24 ENCOUNTER — Encounter: Payer: Self-pay | Admitting: Physician Assistant

## 2018-04-24 VITALS — BP 120/80 | HR 63 | Temp 98.7°F | Resp 16 | Ht 73.0 in | Wt 237.0 lb

## 2018-04-24 DIAGNOSIS — Z23 Encounter for immunization: Secondary | ICD-10-CM

## 2018-04-24 DIAGNOSIS — L719 Rosacea, unspecified: Secondary | ICD-10-CM | POA: Diagnosis not present

## 2018-04-24 DIAGNOSIS — E119 Type 2 diabetes mellitus without complications: Secondary | ICD-10-CM

## 2018-04-24 DIAGNOSIS — I10 Essential (primary) hypertension: Secondary | ICD-10-CM

## 2018-04-24 LAB — POCT GLYCOSYLATED HEMOGLOBIN (HGB A1C)
ESTIMATED AVERAGE GLUCOSE: 160
HEMOGLOBIN A1C: 7.2 % — AB (ref 4.0–5.6)

## 2018-04-24 LAB — POCT UA - MICROALBUMIN: MICROALBUMIN (UR) POC: 20 mg/L

## 2018-04-24 MED ORDER — METFORMIN HCL 1000 MG PO TABS: 1000.0000 mg | ORAL_TABLET | Freq: Two times a day (BID) | ORAL | 1 refills | Status: DC

## 2018-04-24 MED ORDER — GLIPIZIDE ER 5 MG PO TB24: 5.0000 mg | ORAL_TABLET | Freq: Two times a day (BID) | ORAL | 1 refills | Status: DC

## 2018-04-24 MED ORDER — METOPROLOL SUCCINATE ER 25 MG PO TB24: ORAL_TABLET | ORAL | 1 refills | Status: DC

## 2018-04-24 MED ORDER — DOXYCYCLINE MONOHYDRATE 50 MG PO TABS: 50.0000 mg | ORAL_TABLET | Freq: Every day | ORAL | 1 refills | Status: DC

## 2018-04-24 NOTE — Progress Notes (Signed)
Patient: Ivan Kennedy Male    DOB: 05-11-1946   72 y.o.   MRN: [id number] Visit Date: 04/24/2018  Today's Provider: Mar Daring, PA-C   Chief Complaint  Patient presents with  . Diabetes   Subjective:    HPI  Diabetes Mellitus Type II, Follow-up:   Lab Results  Component Value Date   HGBA1C 7.2 (A) 04/24/2018   HGBA1C 7.2 (H) 10/30/2017   HGBA1C 6.9 08/08/2017    Last seen for diabetes 6 months ago.  Management since then includes no changes. He reports excellent compliance with treatment. He is not having side effects.  Current symptoms include none and have been stable. Home blood sugar records: fasting range: 130-180's  Episodes of hypoglycemia? no   Current Insulin Regimen:  Most Recent Eye Exam: UTD Weight trend: stable Prior visit with dietician: no Current diet: in general, a "healthy" diet   Current exercise: walking  Pertinent Labs:    Component Value Date/Time   CHOL 110 10/30/2017 0856   TRIG 90 10/30/2017 0856   HDL 42 10/30/2017 0856   LDLCALC 50 10/30/2017 0856   LDLCALC 38 04/26/2017 0902   CREATININE 1.13 10/30/2017 0856   CREATININE 1.00 04/26/2017 0902    Wt Readings from Last 3 Encounters:  04/24/18 237 lb (107.5 kg)  10/26/17 241 lb (109.3 kg)  09/27/17 247 lb (112 kg)   ------------------------------------------------------------------------   Hypertension, follow-up:  BP Readings from Last 3 Encounters:  04/24/18 120/80  10/26/17 132/76  09/27/17 130/72    He was last seen for hypertension 6 months ago.  BP at that visit was 132/76. Management changes since that visit include no changes. He reports excellent compliance with treatment. He is not having side effects.  He is exercising. He is not adherent to low salt diet.   Outside blood pressures are not being checked. He is experiencing none.  Patient denies chest pain and lower extremity edema.   Cardiovascular risk factors include diabetes mellitus  and hypertension.  Use of agents associated with hypertension: none.     Weight trend: stable Wt Readings from Last 3 Encounters:  04/24/18 237 lb (107.5 kg)  10/26/17 241 lb (109.3 kg)  09/27/17 247 lb (112 kg)    Current diet: in general, a "healthy" diet    ------------------------------------------------------------------------   Follow up for acne   The patient was last seen for this 6 months ago. Changes made at last visit include no changes.  He reports excellent compliance with treatment. He feels that condition is Improved. He is not having side effects.   ------------------------------------------------------------------------------------     No Known Allergies   Current Outpatient Medications:  .  aspirin EC 81 MG tablet, Take 81 mg by mouth daily. , Disp: , Rfl:  .  Blood Glucose Monitoring Suppl (ONE TOUCH ULTRA 2) w/Device KIT, To check blood sugar once daily, Disp: 1 each, Rfl: 0 .  diphenoxylate-atropine (LOMOTIL) 2.5-0.025 MG tablet, Take 1 tablet by mouth 2 (two) times daily., Disp: 60 tablet, Rfl: 5 .  doxycycline (ADOXA) 50 MG tablet, Take 1 tablet (50 mg total) by mouth daily., Disp: 90 tablet, Rfl: 1 .  glipiZIDE (GLUCOTROL XL) 5 MG 24 hr tablet, TAKE 1 TABLET BY MOUTH TWICE A DAY, Disp: 180 tablet, Rfl: 1 .  glucose blood (ONE TOUCH ULTRA TEST) test strip, To check blood sugar once daily, Disp: 100 each, Rfl: 3 .  Lancets (ACCU-CHEK SOFT TOUCH) lancets, To check blood glucose  daily, Disp: 100 each, Rfl: 12 .  loratadine-pseudoephedrine (CLARITIN-D 24-HOUR) 10-240 MG per 24 hr tablet, Take 1 tablet by mouth daily as needed for allergies., Disp: , Rfl:  .  losartan-hydrochlorothiazide (HYZAAR) 100-12.5 MG tablet, Take 1 tablet by mouth daily., Disp: 90 tablet, Rfl: 3 .  metFORMIN (GLUCOPHAGE) 1000 MG tablet, TAKE 1 TABLET BY MOUTH TWICE A DAY, Disp: 180 tablet, Rfl: 1 .  metoprolol succinate (TOPROL-XL) 25 MG 24 hr tablet, 1 TABLET ER 24HR, ORAL, EVERY  DAY, Disp: 90 tablet, Rfl: 3 .  metroNIDAZOLE (METROGEL) 1 % gel, APPLY TO ROSACEA ONCE DAILY., Disp: , Rfl: 5 .  omeprazole (PRILOSEC) 40 MG capsule, Take 1 capsule (40 mg total) by mouth daily., Disp: 90 capsule, Rfl: 3 .  simvastatin (ZOCOR) 20 MG tablet, Take 1 tablet (20 mg total) by mouth every evening., Disp: 90 tablet, Rfl: 3 .  tretinoin (RETIN-A) 0.05 % cream, Apply 1 application topically at bedtime. , Disp: , Rfl: 5 .  vitamin B-12 (CYANOCOBALAMIN) 1000 MCG tablet, Take 1,000 mcg by mouth daily., Disp: , Rfl:   Review of Systems  Constitutional: Negative.   HENT: Negative.   Respiratory: Negative.   Cardiovascular: Negative.   Endocrine: Negative.   Neurological: Negative.     Social History   Tobacco Use  . Smoking status: Never Smoker  . Smokeless tobacco: Never Used  Substance Use Topics  . Alcohol use: Yes    Alcohol/week: 0.0 standard drinks    Comment: occasionally- beer 1-2/month   Objective:   BP 120/80 (BP Location: Left Arm, Patient Position: Sitting, Cuff Size: Large)   Pulse 63   Temp 98.7 F (37.1 C) (Oral)   Resp 16   Ht 6' 1"  (1.854 m)   Wt 237 lb (107.5 kg)   SpO2 99%   BMI 31.27 kg/m  Vitals:   04/24/18 0811  BP: 120/80  Pulse: 63  Resp: 16  Temp: 98.7 F (37.1 C)  TempSrc: Oral  SpO2: 99%  Weight: 237 lb (107.5 kg)  Height: 6' 1"  (9.326 m)     Physical Exam  Constitutional: He appears well-developed and well-nourished. No distress.  HENT:  Head: Normocephalic and atraumatic.  Neck: Normal range of motion. Neck supple.  Cardiovascular: Normal rate, regular rhythm and normal heart sounds. Exam reveals no gallop and no friction rub.  No murmur heard. Pulmonary/Chest: Effort normal and breath sounds normal. No respiratory distress. He has no wheezes. He has no rales.  Musculoskeletal: He exhibits no edema.  Skin: He is not diaphoretic.  Vitals reviewed.       Assessment & Plan:     1. Type 2 diabetes mellitus without  complication, without long-term current use of insulin (HCC) A1c stable at 7.2. Microalbumin down to 20 from 50. Continue medications as below. I will see him back in 6 months for his CPA/AWV.  - POCT glycosylated hemoglobin (Hb A1C) - POCT UA - Microalbumin - glipiZIDE (GLUCOTROL XL) 5 MG 24 hr tablet; Take 1 tablet (5 mg total) by mouth 2 (two) times daily.  Dispense: 180 tablet; Refill: 1 - metFORMIN (GLUCOPHAGE) 1000 MG tablet; Take 1 tablet (1,000 mg total) by mouth 2 (two) times daily.  Dispense: 180 tablet; Refill: 1  2. Essential hypertension Stable. Diagnosis pulled for medication refill. Continue current medical treatment plan. - metoprolol succinate (TOPROL-XL) 25 MG 24 hr tablet; 1 TABLET ER 24HR, ORAL, EVERY DAY  Dispense: 90 tablet; Refill: 1  3. Rosacea Stable. Diagnosis pulled for medication  refill. Continue current medical treatment plan. - doxycycline (ADOXA) 50 MG tablet; Take 1 tablet (50 mg total) by mouth daily.  Dispense: 90 tablet; Refill: 1  4. Need for influenza vaccination Flu vaccine given today without complication. Patient sat upright for 15 minutes to check for adverse reaction before being released. - Flu vaccine HIGH DOSE PF       Mar Daring, PA-C  [facility]

## 2018-04-24 NOTE — Patient Instructions (Signed)
Diabetes Mellitus and Nutrition When you have diabetes (diabetes mellitus), it is very important to have healthy eating habits because your blood sugar (glucose) levels are greatly affected by what you eat and drink. Eating healthy foods in the appropriate amounts, at about the same times every day, can help you:  Control your blood glucose.  Lower your risk of heart disease.  Improve your blood pressure.  Reach or maintain a healthy weight.  Every person with diabetes is different, and each person has different needs for a meal plan. Your health care provider may recommend that you work with a diet and nutrition specialist (dietitian) to make a meal plan that is best for you. Your meal plan may vary depending on factors such as:  The calories you need.  The medicines you take.  Your weight.  Your blood glucose, blood pressure, and cholesterol levels.  Your activity level.  Other health conditions you have, such as heart or kidney disease.  How do carbohydrates affect me? Carbohydrates affect your blood glucose level more than any other type of food. Eating carbohydrates naturally increases the amount of glucose in your blood. Carbohydrate counting is a method for keeping track of how many carbohydrates you eat. Counting carbohydrates is important to keep your blood glucose at a healthy level, especially if you use insulin or take certain oral diabetes medicines. It is important to know how many carbohydrates you can safely have in each meal. This is different for every person. Your dietitian can help you calculate how many carbohydrates you should have at each meal and for snack. Foods that contain carbohydrates include:  Bread, cereal, rice, pasta, and crackers.  Potatoes and corn.  Peas, beans, and lentils.  Milk and yogurt.  Fruit and juice.  Desserts, such as cakes, cookies, ice cream, and candy.  How does alcohol affect me? Alcohol can cause a sudden decrease in blood  glucose (hypoglycemia), especially if you use insulin or take certain oral diabetes medicines. Hypoglycemia can be a life-threatening condition. Symptoms of hypoglycemia (sleepiness, dizziness, and confusion) are similar to symptoms of having too much alcohol. If your health care provider says that alcohol is safe for you, follow these guidelines:  Limit alcohol intake to no more than 1 drink per day for nonpregnant women and 2 drinks per day for men. One drink equals 12 oz of beer, 5 oz of wine, or 1 oz of hard liquor.  Do not drink on an empty stomach.  Keep yourself hydrated with water, diet soda, or unsweetened iced tea.  Keep in mind that regular soda, juice, and other mixers may contain a lot of sugar and must be counted as carbohydrates.  What are tips for following this plan? Reading food labels  Start by checking the serving size on the label. The amount of calories, carbohydrates, fats, and other nutrients listed on the label are based on one serving of the food. Many foods contain more than one serving per package.  Check the total grams (g) of carbohydrates in one serving. You can calculate the number of servings of carbohydrates in one serving by dividing the total carbohydrates by 15. For example, if a food has 30 g of total carbohydrates, it would be equal to 2 servings of carbohydrates.  Check the number of grams (g) of saturated and trans fats in one serving. Choose foods that have low or no amount of these fats.  Check the number of milligrams (mg) of sodium in one serving. Most people   should limit total sodium intake to less than 2,300 mg per day.  Always check the nutrition information of foods labeled as "low-fat" or "nonfat". These foods may be higher in added sugar or refined carbohydrates and should be avoided.  Talk to your dietitian to identify your daily goals for nutrients listed on the label. Shopping  Avoid buying canned, premade, or processed foods. These  foods tend to be high in fat, sodium, and added sugar.  Shop around the outside edge of the grocery store. This includes fresh fruits and vegetables, bulk grains, fresh meats, and fresh dairy. Cooking  Use low-heat cooking methods, such as baking, instead of high-heat cooking methods like deep frying.  Cook using healthy oils, such as olive, canola, or sunflower oil.  Avoid cooking with butter, cream, or high-fat meats. Meal planning  Eat meals and snacks regularly, preferably at the same times every day. Avoid going long periods of time without eating.  Eat foods high in fiber, such as fresh fruits, vegetables, beans, and whole grains. Talk to your dietitian about how many servings of carbohydrates you can eat at each meal.  Eat 4-6 ounces of lean protein each day, such as lean meat, chicken, fish, eggs, or tofu. 1 ounce is equal to 1 ounce of meat, chicken, or fish, 1 egg, or 1/4 cup of tofu.  Eat some foods each day that contain healthy fats, such as avocado, nuts, seeds, and fish. Lifestyle   Check your blood glucose regularly.  Exercise at least 30 minutes 5 or more days each week, or as told by your health care provider.  Take medicines as told by your health care provider.  Do not use any products that contain nicotine or tobacco, such as cigarettes and e-cigarettes. If you need help quitting, ask your health care provider.  Work with a counselor or diabetes educator to identify strategies to manage stress and any emotional and social challenges. What are some questions to ask my health care provider?  Do I need to meet with a diabetes educator?  Do I need to meet with a dietitian?  What number can I call if I have questions?  When are the best times to check my blood glucose? Where to find more information:  American Diabetes Association: diabetes.org/food-and-fitness/food  Academy of Nutrition and Dietetics:  www.eatright.org/resources/health/diseases-and-conditions/diabetes  National Institute of Diabetes and Digestive and Kidney Diseases (NIH): www.niddk.nih.gov/health-information/diabetes/overview/diet-eating-physical-activity Summary  A healthy meal plan will help you control your blood glucose and maintain a healthy lifestyle.  Working with a diet and nutrition specialist (dietitian) can help you make a meal plan that is best for you.  Keep in mind that carbohydrates and alcohol have immediate effects on your blood glucose levels. It is important to count carbohydrates and to use alcohol carefully. This information is not intended to replace advice given to you by your health care provider. Make sure you discuss any questions you have with your health care provider. Document Released: 04/27/2005 Document Revised: 09/04/2016 Document Reviewed: 09/04/2016 Elsevier Interactive Patient Education  2018 Elsevier Inc.  

## 2018-05-02 ENCOUNTER — Ambulatory Visit: Payer: Self-pay | Admitting: Physician Assistant

## 2018-06-25 ENCOUNTER — Other Ambulatory Visit: Payer: Self-pay | Admitting: Physician Assistant

## 2018-06-25 DIAGNOSIS — I10 Essential (primary) hypertension: Secondary | ICD-10-CM

## 2018-06-26 MED ORDER — HYDROCHLOROTHIAZIDE 12.5 MG PO CAPS: 12.5000 mg | ORAL_CAPSULE | Freq: Every day | ORAL | 1 refills | Status: DC

## 2018-06-26 NOTE — Telephone Encounter (Signed)
refilled 

## 2018-07-01 ENCOUNTER — Telehealth: Payer: Self-pay | Admitting: Physician Assistant

## 2018-07-01 NOTE — Telephone Encounter (Signed)
Please Review

## 2018-07-01 NOTE — Telephone Encounter (Signed)
Patient advised as directed below. 

## 2018-07-01 NOTE — Telephone Encounter (Signed)
Pt has never taken the following:  hydrochlorothiazide (MICROZIDE) 12.5 MG capsule  Had been taken losartan 12.5 / Now prescribed losartan (COZAAR) 100 MG tablet  Please let pt know.  He will not take these until he understands what they are for or why the one was changed. He cannot accept the call after 3 today.  Thanks, [employer]

## 2018-07-01 NOTE — Telephone Encounter (Signed)
He was taking a combination pill of losartan and hctz together. It is now on backorder so the medications were split into the two separate medications until the combination pill becomes available again

## 2018-07-28 ENCOUNTER — Other Ambulatory Visit: Payer: Self-pay | Admitting: Physician Assistant

## 2018-07-28 DIAGNOSIS — K580 Irritable bowel syndrome with diarrhea: Secondary | ICD-10-CM

## 2018-07-30 DIAGNOSIS — Z96641 Presence of right artificial hip joint: Secondary | ICD-10-CM | POA: Diagnosis not present

## 2018-07-30 DIAGNOSIS — M1712 Unilateral primary osteoarthritis, left knee: Secondary | ICD-10-CM | POA: Diagnosis not present

## 2018-07-31 DIAGNOSIS — M2010 Hallux valgus (acquired), unspecified foot: Secondary | ICD-10-CM | POA: Insufficient documentation

## 2018-08-01 DIAGNOSIS — Z96651 Presence of right artificial knee joint: Secondary | ICD-10-CM | POA: Insufficient documentation

## 2018-09-09 ENCOUNTER — Other Ambulatory Visit: Payer: Self-pay | Admitting: Physician Assistant

## 2018-09-09 DIAGNOSIS — E119 Type 2 diabetes mellitus without complications: Secondary | ICD-10-CM

## 2018-10-07 ENCOUNTER — Other Ambulatory Visit: Payer: Self-pay | Admitting: Physician Assistant

## 2018-10-07 DIAGNOSIS — K219 Gastro-esophageal reflux disease without esophagitis: Secondary | ICD-10-CM

## 2018-10-17 ENCOUNTER — Ambulatory Visit (INDEPENDENT_AMBULATORY_CARE_PROVIDER_SITE_OTHER): Payer: PPO

## 2018-10-17 VITALS — BP 130/72 | HR 70 | Temp 97.7°F | Ht 73.0 in | Wt 241.8 lb

## 2018-10-17 DIAGNOSIS — Z0000 Encounter for general adult medical examination without abnormal findings: Secondary | ICD-10-CM | POA: Diagnosis not present

## 2018-10-17 NOTE — Progress Notes (Signed)
Subjective:   Ivan Kennedy is a 73 y.o. male who presents for Medicare Annual/Subsequent preventive examination.  Review of Systems:  N/A  Cardiac Risk Factors include: advanced age (>43mn, >>56women);diabetes mellitus;dyslipidemia;hypertension;male gender;obesity (BMI >30kg/m2)     Objective:    Vitals: BP 130/72 (BP Location: Right Arm)   Pulse 70   Temp 97.7 F (36.5 C) (Oral)   Ht 6' 1"  (1.854 m)   Wt 241 lb 12.8 oz (109.7 kg)   BMI 31.90 kg/m   Body mass index is 31.9 kg/m.  Advanced Directives 10/17/2018 09/27/2017 10/11/2016 07/19/2015 01/21/2015  Does Patient Have a Medical Advance Directive? Yes Yes Yes No No  Type of Advance Directive Living will;Healthcare Power of [facility] Chart? No - copy requested No - copy requested No - copy requested - -  Would patient like information on creating a medical advance directive? - - - - No - patient declined information    Tobacco Social History   Tobacco Use  Smoking Status Never Smoker  Smokeless Tobacco Never Used     Counseling given: Not Answered   Clinical Intake:  Pre-visit preparation completed: Yes  Pain : No/denies pain Pain Score: 0-No pain    Diabetes:  Is the patient diabetic?  Yes type  If diabetic, was a CBG obtained today?  No  Did the patient bring in their glucometer from home?  No  How often do you monitor your CBG's? Once daliy.   Financial Strains and Diabetes Management:  Are you having any financial strains with the device, your supplies or your medication? No .  Does the patient want to be seen by Chronic Care Management for management of their diabetes?  No  Would the patient like to be referred to a Nutritionist or for Diabetic Management?  No   Diabetic Exams:  Diabetic Eye Exam: Completed 02/21/18.   Diabetic Foot Exam: Completed 04/26/17. Pt has been advised about  the importance in completing this exam. Note made for PCP to f/u on this at next OV.   Nutritional Status: BMI > 30  Obese Nutritional Risks: None   How often do you need to have someone help you when you read instructions, pamphlets, or other written materials from your doctor or pharmacy?: 1 - Never  Interpreter Needed?: No  Information entered by :: [facility] LPN  Past Medical History:  Diagnosis Date  . Colon polyp 2005  . Diabetes mellitus ([place]   . Hyperlipidemia   . Hypertension   . Rosacea   . Sleep apnea    Past Surgical History:  Procedure Laterality Date  . ANAL FISSURE REPAIR  1998  . COLONOSCOPY  2005, 2010  . 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 HEarnestine Leys . shoulder spurs Right 2000   Family History  Problem Relation Age of Onset  . Diabetes Mother    Social History   Socioeconomic History  . Marital status: Married    Spouse name: Not on file  . Number of children: 1  . Years of education: Not on file  . Highest education level: GED or equivalent  Occupational History  . Occupation: retired  SScientific laboratory technician . Financial resource strain: Not hard at all  . Food insecurity:    Worry: Never true    Inability: Never true  . Transportation needs:    Medical: No  Non-medical: No  Tobacco Use  . Smoking status: Never Smoker  . Smokeless tobacco: Never Used  Substance and Sexual Activity  . Alcohol use: Yes    Alcohol/week: 0.0 standard drinks    Comment: occasionally- beer 1-2/month  . Drug use: No  . Sexual activity: Not on file  Lifestyle  . Physical activity:    Days per week: 0 days    Minutes per session: 0 min  . Stress: Not at all  Relationships  . Social connections:    Talks on phone: Patient refused    Gets together: Patient refused    Attends religious service: Patient refused    Active member of club or organization: Patient refused    Attends meetings of clubs or organizations: Patient refused    Relationship  status: Patient refused  Other Topics Concern  . Not on file  Social History Narrative  . Not on file    Outpatient Encounter Medications as of 10/17/2018  Medication Sig  . aspirin EC 81 MG tablet Take 81 mg by mouth daily.   . Blood Glucose Monitoring Suppl (ONE TOUCH ULTRA 2) w/Device KIT To check blood sugar once daily  . diphenoxylate-atropine (LOMOTIL) 2.5-0.025 MG tablet TAKE 1 TABLET BY MOUTH TWICE A DAY  . doxycycline (ADOXA) 50 MG tablet Take 1 tablet (50 mg total) by mouth daily.  Marland Kitchen glipiZIDE (GLUCOTROL XL) 5 MG 24 hr tablet Take 1 tablet (5 mg total) by mouth 2 (two) times daily.  Marland Kitchen glucose blood test strip TO CHECK BLOOD SUGAR ONCE DAILY  . hydrochlorothiazide (MICROZIDE) 12.5 MG capsule Take 1 capsule (12.5 mg total) by mouth daily.  . Lancets (ACCU-CHEK SOFT TOUCH) lancets To check blood glucose daily  . loratadine-pseudoephedrine (CLARITIN-D 24-HOUR) 10-240 MG per 24 hr tablet Take 1 tablet by mouth daily as needed for allergies.  Marland Kitchen losartan (COZAAR) 100 MG tablet Take 1 tablet (100 mg total) by mouth daily.  . metFORMIN (GLUCOPHAGE) 1000 MG tablet Take 1 tablet (1,000 mg total) by mouth 2 (two) times daily.  . metoprolol succinate (TOPROL-XL) 25 MG 24 hr tablet 1 TABLET ER 24HR, ORAL, EVERY DAY  . metroNIDAZOLE (METROGEL) 1 % gel APPLY TO ROSACEA ONCE DAILY.  Marland Kitchen omeprazole (PRILOSEC) 40 MG capsule TAKE 1 CAPSULE BY MOUTH EVERY DAY  . simvastatin (ZOCOR) 20 MG tablet Take 1 tablet (20 mg total) by mouth every evening.  . tretinoin (RETIN-A) 0.05 % cream Apply 1 application topically at bedtime.   . vitamin B-12 (CYANOCOBALAMIN) 1000 MCG tablet Take 1,000 mcg by mouth daily.   No facility-administered encounter medications on file as of 10/17/2018.     Activities of Daily Living In your present state of health, do you have any difficulty performing the following activities: 10/17/2018  Hearing? N  Vision? N  Difficulty concentrating or making decisions? N  Walking or  climbing stairs? N  Dressing or bathing? N  Doing errands, shopping? N  Preparing Food and eating ? N  Using the Toilet? N  In the past six months, have you accidently leaked urine? N  Do you have problems with loss of bowel control? N  Managing your Medications? N  Managing your Finances? N  Housekeeping or managing your Housekeeping? N  Some recent data might be hidden    Patient Care Team: Mar Daring, PA-C as PCP - General (Family Medicine) Arelia Sneddon, Patrick as Consulting Physician (Optometry) Jannet Mantis, MD as Consulting Physician (Dermatology) Earnestine Leys, MD (Orthopedic Surgery)  Assessment:   This is a routine wellness examination for Ivan Kennedy.  Exercise Activities and Dietary recommendations Current Exercise Habits: The patient does not participate in regular exercise at present, Exercise limited by: None identified  Goals    . DIET - INCREASE WATER INTAKE     Recommend increasing water intake to 4-6 glasses a day.    Marland Kitchen DIET - REDUCE CARBOHYDRATES     Recommend to cut back on carbohydrates to 1 serving a day and to focus on eating more good carbohydrates.     . Increase water intake     Starting 10/11/16, I will increase my water intake to 4-6 glasses a day.        Fall Risk Fall Risk  10/17/2018 09/27/2017 04/26/2017 10/25/2016 10/11/2016  Falls in the past year? 0 No No No No   FALL RISK PREVENTION PERTAINING TO THE HOME: Any stairs in or around the home? Yes  If so, do they handrails? Yes   Home free of loose throw rugs in walkways, pet beds, electrical cords, etc? YES Adequate lighting in your home to reduce risk of falls? Yes   ASSISTIVE DEVICES UTILIZED TO PREVENT FALLS:  Life alert? No  Use of a cane, walker or w/c? No  Grab bars in the bathroom? No  Shower chair or bench in shower? No  Elevated toilet seat or a handicapped toilet? No    TIMED UP AND GO:  Was the test performed? No .    Depression Screen PHQ 2/9 Scores  10/17/2018 10/17/2018 09/27/2017 10/11/2016  PHQ - 2 Score 0 0 0 0  PHQ- 9 Score 1 - - -    Cognitive Function: Declined today.     6CIT Screen 10/11/2016  What Year? 0 points  What month? 0 points  What time? 0 points  Count back from 20 0 points  Months in reverse 0 points  Repeat phrase 2 points  Total Score 2    Immunization History  Administered Date(s) Administered  . Hepatitis B, adult 05/29/2012, 07/29/2012, 03/18/2013  . Influenza, High Dose Seasonal PF 07/06/2014, 05/06/2015, 07/11/2016, 04/26/2017, 04/24/2018  . Pneumococcal Conjugate-13 07/06/2014  . Pneumococcal Polysaccharide-23 05/29/2012    Qualifies for Shingles Vaccine? Yes . Due for Shingrix. Education has been provided regarding the importance of this vaccine. Pt has been advised to call insurance company to determine out of pocket expense. Advised may also receive vaccine at local pharmacy or Health Dept. Verbalized acceptance and understanding.  Tdap: Although this vaccine is not a covered service during a Wellness Exam, does the patient still wish to receive this vaccine today?  No .  Education has been provided regarding the importance of this vaccine. Advised may receive this vaccine at local pharmacy or Health Dept. Aware to provide a copy of the vaccination record if obtained from local pharmacy or Health Dept. Verbalized acceptance and understanding.  Flu Vaccine: Up to date  Pneumococcal Vaccine: Due for Pneumococcal vaccine. Does the patient want to receive this vaccine today?  No . Education has been provided regarding the importance of this vaccine but still declined. Advised may receive this vaccine at local pharmacy or Health Dept. Aware to provide a copy of the vaccination record if obtained from local pharmacy or Health Dept. Verbalized acceptance and understanding.  Screening Tests Health Maintenance  Topic Date Due  . PNA vac Low Risk Adult (2 of 2 - PPSV23) 05/29/2017  . FOOT EXAM  04/26/2018  .  TETANUS/TDAP  10/27/2018 (Originally 12/23/1964)  .  HEMOGLOBIN A1C  10/23/2018  . OPHTHALMOLOGY EXAM  02/22/2019  . COLONOSCOPY  10/29/2019  . INFLUENZA VACCINE  Completed  . Hepatitis C Screening  Completed   Cancer Screenings:  Colorectal Screening: Completed 10/29/14. Repeat every 5 years.  Lung Cancer Screening: (Low Dose CT Chest recommended if Age 19-80 years, 30 pack-year currently smoking OR have quit w/in 15years.) does not qualify.    Additional Screening:  Hepatitis C Screening: Up to date  Vision Screening: Recommended annual ophthalmology exams for early detection of glaucoma and other disorders of the eye.  Dental Screening: Recommended annual dental exams for proper oral hygiene  Community Resource Referral:  CRR required this visit?  No       Plan:  I have personally reviewed and addressed the Medicare Annual Wellness questionnaire and have noted the following in the patient's chart:  A. Medical and social history B. Use of alcohol, tobacco or illicit drugs  C. Current medications and supplements D. Functional ability and status E.  Nutritional status F.  Physical activity G. Advance directives H. List of other physicians I.  Hospitalizations, surgeries, and ER visits in previous 12 months J.  [place] such as hearing and vision if needed, cognitive and depression L. Referrals and appointments - none  In addition, I have reviewed and discussed with patient certain preventive protocols, quality metrics, and best practice recommendations. A written personalized care plan for preventive services as well as general preventive health recommendations were provided to patient.  See attached scanned questionnaire for additional information.   Signed,  Fabio Neighbors, LPN Nurse Health Advisor   Nurse Recommendations: Pt declined the Pneumovax 23 and tetanus vaccines today.

## 2018-10-17 NOTE — Patient Instructions (Signed)
Ivan Kennedy , Thank you for taking time to come for your Medicare Wellness Visit. I appreciate your ongoing commitment to your health goals. Please review the following plan we discussed and let me know if I can assist you in the future.   Screening recommendations/referrals: Colonoscopy: Up to date, due 10/2019 Recommended yearly ophthalmology/optometry visit for glaucoma screening and checkup Recommended yearly dental visit for hygiene and checkup  Vaccinations: Influenza vaccine: Up to date Pneumococcal vaccine: Pt declines today.  Tdap vaccine: Pt declines today.  Shingles vaccine: Pt declines today.     Advanced directives: Please bring a copy of your POA (Power of Attorney) and/or Living Will to your next appointment.   Conditions/risks identified: Obesity/Diabetes- recommend to cut back on carbohydrates to 1 serving a day and to focus on eating more good carbohydrates.   Next appointment: 10/30/18 @ 9:00 AM with [name].   Preventive Care 18 Years and Older, Male Preventive care refers to lifestyle choices and visits with your health care provider that can promote health and wellness. What does preventive care include?  A yearly physical exam. This is also called an annual well check.  Dental exams once or twice a year.  Routine eye exams. Ask your health care provider how often you should have your eyes checked.  Personal lifestyle choices, including:  Daily care of your teeth and gums.  Regular physical activity.  Eating a healthy diet.  Avoiding tobacco and drug use.  Limiting alcohol use.  Practicing safe sex.  Taking low doses of aspirin every day.  Taking vitamin and mineral supplements as recommended by your health care provider. What happens during an annual well check? The services and screenings done by your health care provider during your annual well check will depend on your age, overall health, lifestyle risk factors, and family history of  disease. Counseling  Your health care provider may ask you questions about your:  Alcohol use.  Tobacco use.  Drug use.  Emotional well-being.  Home and relationship well-being.  Sexual activity.  Eating habits.  History of falls.  Memory and ability to understand (cognition).  Work and work Statistician. Screening  You may have the following tests or measurements:  Height, weight, and BMI.  Blood pressure.  Lipid and cholesterol levels. These may be checked every 5 years, or more frequently if you are over 62 years old.  Skin check.  Lung cancer screening. You may have this screening every year starting at age 76 if you have a 30-pack-year history of smoking and currently smoke or have quit within the past 15 years.  Fecal occult blood test (FOBT) of the stool. You may have this test every year starting at age 25.  Flexible sigmoidoscopy or colonoscopy. You may have a sigmoidoscopy every 5 years or a colonoscopy every 10 years starting at age 84.  Prostate cancer screening. Recommendations will vary depending on your family history and other risks.  Hepatitis C blood test.  Hepatitis B blood test.  Sexually transmitted disease (STD) testing.  Diabetes screening. This is done by checking your blood sugar (glucose) after you have not eaten for a while (fasting). You may have this done every 1-3 years.  Abdominal aortic aneurysm (AAA) screening. You may need this if you are a current or former smoker.  Osteoporosis. You may be screened starting at age 23 if you are at high risk. Talk with your health care provider about your test results, treatment options, and if necessary, the need  for more tests. Vaccines  Your health care provider may recommend certain vaccines, such as:  Influenza vaccine. This is recommended every year.  Tetanus, diphtheria, and acellular pertussis (Tdap, Td) vaccine. You may need a Td booster every 10 years.  Zoster vaccine. You may  need this after age 56.  Pneumococcal 13-valent conjugate (PCV13) vaccine. One dose is recommended after age 61.  Pneumococcal polysaccharide (PPSV23) vaccine. One dose is recommended after age 67. Talk to your health care provider about which screenings and vaccines you need and how often you need them. This information is not intended to replace advice given to you by your health care provider. Make sure you discuss any questions you have with your health care provider. Document Released: 08/27/2015 Document Revised: 04/19/2016 Document Reviewed: 06/01/2015 Elsevier Interactive Patient Education  2017 [employer] Prevention in the Home Falls can cause injuries. They can happen to people of all ages. There are many things you can do to make your home safe and to help prevent falls. What can I do on the outside of my home?  Regularly fix the edges of walkways and driveways and fix any cracks.  Remove anything that might make you trip as you walk through a door, such as a raised step or threshold.  Trim any bushes or trees on the path to your home.  Use bright outdoor lighting.  Clear any walking paths of anything that might make someone trip, such as rocks or tools.  Regularly check to see if handrails are loose or broken. Make sure that both sides of any steps have handrails.  Any raised decks and porches should have guardrails on the edges.  Have any leaves, snow, or ice cleared regularly.  Use sand or salt on walking paths during winter.  Clean up any spills in your garage right away. This includes oil or grease spills. What can I do in the bathroom?  Use night lights.  Install grab bars by the toilet and in the tub and shower. Do not use towel bars as grab bars.  Use non-skid mats or decals in the tub or shower.  If you need to sit down in the shower, use a plastic, non-slip stool.  Keep the floor dry. Clean up any water that spills on the floor as soon as it  happens.  Remove soap buildup in the tub or shower regularly.  Attach bath mats securely with double-sided non-slip rug tape.  Do not have throw rugs and other things on the floor that can make you trip. What can I do in the bedroom?  Use night lights.  Make sure that you have a light by your bed that is easy to reach.  Do not use any sheets or blankets that are too big for your bed. They should not hang down onto the floor.  Have a firm chair that has side arms. You can use this for support while you get dressed.  Do not have throw rugs and other things on the floor that can make you trip. What can I do in the kitchen?  Clean up any spills right away.  Avoid walking on wet floors.  Keep items that you use a lot in easy-to-reach places.  If you need to reach something above you, use a strong step stool that has a grab bar.  Keep electrical cords out of the way.  Do not use floor polish or wax that makes floors slippery. If you must use wax, use  non-skid floor wax.  Do not have throw rugs and other things on the floor that can make you trip. What can I do with my stairs?  Do not leave any items on the stairs.  Make sure that there are handrails on both sides of the stairs and use them. Fix handrails that are broken or loose. Make sure that handrails are as long as the stairways.  Check any carpeting to make sure that it is firmly attached to the stairs. Fix any carpet that is loose or worn.  Avoid having throw rugs at the top or bottom of the stairs. If you do have throw rugs, attach them to the floor with carpet tape.  Make sure that you have a light switch at the top of the stairs and the bottom of the stairs. If you do not have them, ask someone to add them for you. What else can I do to help prevent falls?  Wear shoes that:  Do not have high heels.  Have rubber bottoms.  Are comfortable and fit you well.  Are closed at the toe. Do not wear sandals.  If you  use a stepladder:  Make sure that it is fully opened. Do not climb a closed stepladder.  Make sure that both sides of the stepladder are locked into place.  Ask someone to hold it for you, if possible.  Clearly mark and make sure that you can see:  Any grab bars or handrails.  First and last steps.  Where the edge of each step is.  Use tools that help you move around (mobility aids) if they are needed. These include:  Canes.  Walkers.  Scooters.  Crutches.  Turn on the lights when you go into a dark area. Replace any light bulbs as soon as they burn out.  Set up your furniture so you have a clear path. Avoid moving your furniture around.  If any of your floors are uneven, fix them.  If there are any pets around you, be aware of where they are.  Review your medicines with your doctor. Some medicines can make you feel dizzy. This can increase your chance of falling. Ask your doctor what other things that you can do to help prevent falls. This information is not intended to replace advice given to you by your health care provider. Make sure you discuss any questions you have with your health care provider. Document Released: 05/27/2009 Document Revised: 01/06/2016 Document Reviewed: 09/04/2014 Elsevier Interactive Patient Education  2017 [employer].

## 2018-10-18 ENCOUNTER — Other Ambulatory Visit: Payer: Self-pay | Admitting: Physician Assistant

## 2018-10-18 DIAGNOSIS — E7800 Pure hypercholesterolemia, unspecified: Secondary | ICD-10-CM

## 2018-10-30 ENCOUNTER — Encounter: Payer: Self-pay | Admitting: Physician Assistant

## 2018-10-30 ENCOUNTER — Other Ambulatory Visit: Payer: Self-pay | Admitting: Physician Assistant

## 2018-10-30 ENCOUNTER — Ambulatory Visit (INDEPENDENT_AMBULATORY_CARE_PROVIDER_SITE_OTHER): Payer: PPO | Admitting: Physician Assistant

## 2018-10-30 ENCOUNTER — Other Ambulatory Visit: Payer: Self-pay

## 2018-10-30 VITALS — BP 154/85 | HR 62 | Temp 97.8°F | Resp 16 | Ht 73.0 in | Wt 238.8 lb

## 2018-10-30 DIAGNOSIS — R079 Chest pain, unspecified: Secondary | ICD-10-CM

## 2018-10-30 DIAGNOSIS — R0602 Shortness of breath: Secondary | ICD-10-CM | POA: Diagnosis not present

## 2018-10-30 DIAGNOSIS — E7800 Pure hypercholesterolemia, unspecified: Secondary | ICD-10-CM | POA: Diagnosis not present

## 2018-10-30 DIAGNOSIS — Z23 Encounter for immunization: Secondary | ICD-10-CM | POA: Diagnosis not present

## 2018-10-30 DIAGNOSIS — Z0000 Encounter for general adult medical examination without abnormal findings: Secondary | ICD-10-CM | POA: Diagnosis not present

## 2018-10-30 DIAGNOSIS — I10 Essential (primary) hypertension: Secondary | ICD-10-CM | POA: Diagnosis not present

## 2018-10-30 DIAGNOSIS — E119 Type 2 diabetes mellitus without complications: Secondary | ICD-10-CM | POA: Diagnosis not present

## 2018-10-30 DIAGNOSIS — Z125 Encounter for screening for malignant neoplasm of prostate: Secondary | ICD-10-CM

## 2018-10-30 DIAGNOSIS — K760 Fatty (change of) liver, not elsewhere classified: Secondary | ICD-10-CM

## 2018-10-30 LAB — POCT GLYCOSYLATED HEMOGLOBIN (HGB A1C): Hemoglobin A1C: 7.4 % — AB (ref 4.0–5.6)

## 2018-10-30 NOTE — Patient Instructions (Signed)
Health Maintenance After Age 71 After age 71, you are at a higher risk for certain long-term diseases and infections as well as injuries from falls. Falls are a major cause of broken bones and head injuries in people who are older than age 71. Getting regular preventive care can help to keep you healthy and well. Preventive care includes getting regular testing and making lifestyle changes as recommended by your health care provider. Talk with your health care provider about:  Which screenings and tests you should have. A screening is a test that checks for a disease when you have no symptoms.  A diet and exercise plan that is right for you. What should I know about screenings and tests to prevent falls? Screening and testing are the best ways to find a health problem early. Early diagnosis and treatment give you the best chance of managing medical conditions that are common after age 71. Certain conditions and lifestyle choices may make you more likely to have a fall. Your health care provider may recommend:  Regular vision checks. Poor vision and conditions such as cataracts can make you more likely to have a fall. If you wear glasses, make sure to get your prescription updated if your vision changes.  Medicine review. Work with your health care provider to regularly review all of the medicines you are taking, including over-the-counter medicines. Ask your health care provider about any side effects that may make you more likely to have a fall. Tell your health care provider if any medicines that you take make you feel dizzy or sleepy.  Osteoporosis screening. Osteoporosis is a condition that causes the bones to get weaker. This can make the bones weak and cause them to break more easily.  Blood pressure screening. Blood pressure changes and medicines to control blood pressure can make you feel dizzy.  Strength and balance checks. Your health care provider may recommend certain tests to check your  strength and balance while standing, walking, or changing positions.  Foot health exam. Foot pain and numbness, as well as not wearing proper footwear, can make you more likely to have a fall.  Depression screening. You may be more likely to have a fall if you have a fear of falling, feel emotionally low, or feel unable to do activities that you used to do.  Alcohol use screening. Using too much alcohol can affect your balance and may make you more likely to have a fall. What actions can I take to lower my risk of falls? General instructions  Talk with your health care provider about your risks for falling. Tell your health care provider if: ? You fall. Be sure to tell your health care provider about all falls, even ones that seem minor. ? You feel dizzy, sleepy, or off-balance.  Take over-the-counter and prescription medicines only as told by your health care provider. These include any supplements.  Eat a healthy diet and maintain a healthy weight. A healthy diet includes low-fat dairy products, low-fat (lean) meats, and fiber from whole grains, beans, and lots of fruits and vegetables. Home safety  Remove any tripping hazards, such as rugs, cords, and clutter.  Install safety equipment such as grab bars in bathrooms and safety rails on stairs.  Keep rooms and walkways well-lit. Activity   Follow a regular exercise program to stay fit. This will help you maintain your balance. Ask your health care provider what types of exercise are appropriate for you.  If you need a cane or   walker, use it as recommended by your health care provider.  Wear supportive shoes that have nonskid soles. Lifestyle  Do not drink alcohol if your health care provider tells you not to drink.  If you drink alcohol, limit how much you have: ? 0-1 drink a day for women. ? 0-2 drinks a day for men.  Be aware of how much alcohol is in your drink. In the [place] one drink equals one typical bottle of beer (12  oz), one-half glass of wine (5 oz), or one shot of hard liquor (1 oz).  Do not use any products that contain nicotine or tobacco, such as cigarettes and e-cigarettes. If you need help quitting, ask your health care provider. Summary  Having a healthy lifestyle and getting preventive care can help to protect your health and wellness after age 71.  Screening and testing are the best way to find a health problem early and help you avoid having a fall. Early diagnosis and treatment give you the best chance for managing medical conditions that are more common for people who are older than age 71.  Falls are a major cause of broken bones and head injuries in people who are older than age 71. Take precautions to prevent a fall at home.  Work with your health care provider to learn what changes you can make to improve your health and wellness and to prevent falls. This information is not intended to replace advice given to you by your health care provider. Make sure you discuss any questions you have with your health care provider. Document Released: 06/13/2017 Document Revised: 06/13/2017 Document Reviewed: 06/13/2017 Elsevier Interactive Patient Education  2019 [employer]  

## 2018-10-30 NOTE — Progress Notes (Signed)
Patient: Ivan Kennedy, Male    DOB: 06/19/1946, 73 y.o.   MRN: [id number] Visit Date: 10/30/2018  Today's Provider: Mar Daring, PA-C   Chief Complaint  Patient presents with  . Annual Exam  . Diabetes  . Hypertension   Subjective:    Annual physical exam Ivan Kennedy is a 73 y.o. male who presents today for health maintenance and complete physical. He feels well. He reports exercising daily (walkig),patient reports that he is following a well balanced diet. He reports he is sleeping fairly well.  Last Reported: Prevnar 07/06/17, Pneumo 05/29/2012, Influenza 04/24/18. Patient is due for Tdap vaccine.    Hypertension, follow-up:  BP Readings from Last 3 Encounters:  10/30/18 (!) 154/85  10/17/18 130/72  04/24/18 120/80    He was last seen for hypertension 6 months ago.  BP at that visit was 120/80. Management changes since that visit include none. He reports excellent compliance with treatment. He is not having side effects.  He is exercising. He is not adherent to low salt diet.   Outside blood pressures are not being checked. He is experiencing dyspnea and exertional chest pressure/discomfort.  Patient denies chest pain, chest pressure/discomfort, claudication, fatigue, irregular heart beat, lower extremity edema, near-syncope, orthopnea, palpitations, paroxysmal nocturnal dyspnea, syncope and tachypnea.   Cardiovascular risk factors include advanced age (older than 92 for men, 24 for women), diabetes mellitus, hypertension and male gender.  Use of agents associated with hypertension: NSAIDS.     Weight trend: stable Wt Readings from Last 3 Encounters:  10/30/18 238 lb 12.8 oz (108.3 kg)  10/17/18 241 lb 12.8 oz (109.7 kg)  04/24/18 237 lb (107.5 kg)    Current diet: well balanced  ------------------------------------------------------------------------  Diabetes Mellitus Type II, Follow-up:   Lab Results  Component Value Date   HGBA1C 7.4  (A) 10/30/2018   HGBA1C 7.2 (A) 04/24/2018   HGBA1C 7.2 (H) 10/30/2017    Last seen for diabetes 6 months ago.  Management since then includes none. He reports excellent compliance with treatment. He is not having side effects.  Current symptoms include none and have been unchanged. Home blood sugar records: 130-190  Episodes of hypoglycemia? no   Current Insulin Regimen: N/A Most Recent Eye Exam: [facility] Weight trend: stable Prior visit with dietician: No Current exercise: walking Current diet habits: well balanced  Pertinent Labs:    Component Value Date/Time   CHOL 110 10/30/2017 0856   TRIG 90 10/30/2017 0856   HDL 42 10/30/2017 0856   LDLCALC 50 10/30/2017 0856   LDLCALC 38 04/26/2017 0902   CREATININE 1.13 10/30/2017 0856   CREATININE 1.00 04/26/2017 0902    Wt Readings from Last 3 Encounters:  10/30/18 238 lb 12.8 oz (108.3 kg)  10/17/18 241 lb 12.8 oz (109.7 kg)  04/24/18 237 lb (107.5 kg)   ------------------------------------------------------------------------ -----------------------------------------------------------------   Review of Systems  Constitutional: Negative.   HENT: Negative.   Eyes: Negative.   Respiratory: Negative.   Cardiovascular: Negative.   Gastrointestinal: Negative.   Endocrine: Negative.   Genitourinary: Negative.   Musculoskeletal: Negative.   Skin: Negative.   Allergic/Immunologic: Negative.   Neurological: Negative.   Hematological: Negative.   Psychiatric/Behavioral: Negative.     Social History He  reports that he has never smoked. He has never used smokeless tobacco. He reports current alcohol use. He reports that he does not use drugs. Social History   Socioeconomic History  . Marital status: Married  Spouse name: Not on file  . Number of children: 1  . Years of education: Not on file  . Highest education level: GED or equivalent  Occupational History  . Occupation: retired  Scientific laboratory technician  . Financial  resource strain: Not hard at all  . Food insecurity:    Worry: Never true    Inability: Never true  . Transportation needs:    Medical: No    Non-medical: No  Tobacco Use  . Smoking status: Never Smoker  . Smokeless tobacco: Never Used  Substance and Sexual Activity  . Alcohol use: Yes    Alcohol/week: 0.0 standard drinks    Comment: occasionally- beer 1-2/month  . Drug use: No  . Sexual activity: Not on file  Lifestyle  . Physical activity:    Days per week: 0 days    Minutes per session: 0 min  . Stress: Not at all  Relationships  . Social connections:    Talks on phone: Patient refused    Gets together: Patient refused    Attends religious service: Patient refused    Active member of club or organization: Patient refused    Attends meetings of clubs or organizations: Patient refused    Relationship status: Patient refused  Other Topics Concern  . Not on file  Social History Narrative  . Not on file    Patient Active Problem List   Diagnosis Date Noted  . Hallux valgus 07/31/2018  . IBS (irritable bowel syndrome) 01/21/2015  . B12 deficiency 01/07/2015  . Adult BMI 30+ 01/07/2015  . Hyperlipidemia 01/07/2015  . Cannot sleep 01/07/2015  . Fatty liver disease, nonalcoholic 77/41/2878  . Osteoarthritis of knee 01/07/2015  . Acne erythematosa 01/07/2015  . Disorder of male genital organs 01/07/2015  . Atypical chest pain 10/09/2014  . Acid reflux 09/06/2009  . Diabetes mellitus, type 2 ([place]) 11/20/2008  . Colon, diverticulosis 11/20/2008  . BP (high blood pressure) 11/20/2008  . Apnea, sleep 11/20/2008  . History of artificial joint 11/20/2008    Past Surgical History:  Procedure Laterality Date  . ANAL FISSURE REPAIR  1998  . COLONOSCOPY  2005, 2010  . 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 Earnestine Leys  . shoulder spurs Right 2000    Family History  Family Status  Relation Name Status  . Mother  Deceased  . Father  Deceased  . Sister  Alive   . Brother  Alive   His family history includes Diabetes in his mother.     No Known Allergies  Previous Medications   ASPIRIN EC 81 MG TABLET    Take 81 mg by mouth daily.    BLOOD GLUCOSE MONITORING SUPPL (ONE TOUCH ULTRA 2) W/DEVICE KIT    To check blood sugar once daily   DIPHENOXYLATE-ATROPINE (LOMOTIL) 2.5-0.025 MG TABLET    TAKE 1 TABLET BY MOUTH TWICE A DAY   DOXYCYCLINE (ADOXA) 50 MG TABLET    Take 1 tablet (50 mg total) by mouth daily.   GLIPIZIDE (GLUCOTROL XL) 5 MG 24 HR TABLET    Take 1 tablet (5 mg total) by mouth 2 (two) times daily.   GLUCOSE BLOOD TEST STRIP    TO CHECK BLOOD SUGAR ONCE DAILY   HYDROCHLOROTHIAZIDE (MICROZIDE) 12.5 MG CAPSULE    Take 1 capsule (12.5 mg total) by mouth daily.   LANCETS (ACCU-CHEK SOFT TOUCH) LANCETS    To check blood glucose daily   LORATADINE-PSEUDOEPHEDRINE (CLARITIN-D 24-HOUR) 10-240 MG PER 24 HR TABLET  Take 1 tablet by mouth daily as needed for allergies.   LOSARTAN (COZAAR) 100 MG TABLET    Take 1 tablet (100 mg total) by mouth daily.   METFORMIN (GLUCOPHAGE) 1000 MG TABLET    Take 1 tablet (1,000 mg total) by mouth 2 (two) times daily.   METOPROLOL SUCCINATE (TOPROL-XL) 25 MG 24 HR TABLET    1 TABLET ER 24HR, ORAL, EVERY DAY   METRONIDAZOLE (METROGEL) 1 % GEL    APPLY TO ROSACEA ONCE DAILY.   OMEPRAZOLE (PRILOSEC) 40 MG CAPSULE    TAKE 1 CAPSULE BY MOUTH EVERY DAY   SIMVASTATIN (ZOCOR) 20 MG TABLET    TAKE 1 TABLET BY MOUTH EVERY DAY IN THE EVENING   TRETINOIN (RETIN-A) 0.05 % CREAM    Apply 1 application topically at bedtime.    VITAMIN B-12 (CYANOCOBALAMIN) 1000 MCG TABLET    Take 1,000 mcg by mouth daily.    Patient Care Team: Mar Daring, PA-C as PCP - General (Family Medicine) Arelia Sneddon, OD as Consulting Physician (Optometry) Jannet Mantis, MD as Consulting Physician (Dermatology) Earnestine Leys, MD (Orthopedic Surgery)      Objective:   Vitals: BP (!) 154/85   Pulse 62   Temp 97.8 F (36.6  C) (Oral)   Resp 16   Ht 6' 1"  (1.854 m)   Wt 238 lb 12.8 oz (108.3 kg)   BMI 31.51 kg/m    Physical Exam Constitutional:      General: He is not in acute distress.    Appearance: Normal appearance. He is well-developed. He is obese. He is not ill-appearing.  HENT:     Head: Normocephalic and atraumatic.     Right Ear: Tympanic membrane, ear canal and external ear normal.     Left Ear: Tympanic membrane, ear canal and external ear normal.     Nose: Nose normal.     Mouth/Throat:     Mouth: Mucous membranes are moist.     Pharynx: No posterior oropharyngeal erythema.  Eyes:     General:        Right eye: No discharge.     Conjunctiva/sclera: Conjunctivae normal.     Pupils: Pupils are equal, round, and reactive to light.  Neck:     Musculoskeletal: Normal range of motion and neck supple.     Thyroid: No thyromegaly.     Vascular: No carotid bruit.     Trachea: No tracheal deviation.  Cardiovascular:     Rate and Rhythm: Normal rate and regular rhythm.     Heart sounds: Normal heart sounds. No murmur.  Pulmonary:     Effort: Pulmonary effort is normal. No respiratory distress.     Breath sounds: Normal breath sounds. No wheezing or rales.  Chest:     Chest wall: No tenderness.  Abdominal:     General: There is no distension.     Palpations: Abdomen is soft. There is no mass.     Tenderness: There is no abdominal tenderness. There is no guarding or rebound.  Musculoskeletal: Normal range of motion.        General: No tenderness.  Lymphadenopathy:     Cervical: No cervical adenopathy.  Skin:    General: Skin is warm and dry.     Capillary Refill: Capillary refill takes less than 2 seconds.     Findings: No erythema or rash.  Neurological:     General: No focal deficit present.     Mental Status: He is alert  and oriented to person, place, and time. Mental status is at baseline.     Cranial Nerves: No cranial nerve deficit.     Motor: No abnormal muscle tone.      Coordination: Coordination normal.     Deep Tendon Reflexes: Reflexes are normal and symmetric. Reflexes normal.  Psychiatric:        Mood and Affect: Mood normal.        Behavior: Behavior normal.        Thought Content: Thought content normal.        Judgment: Judgment normal.     Diabetic Foot Exam - Simple   Simple Foot Form Diabetic Foot exam was performed with the following findings:  Yes 10/30/2018  9:53 AM  Visual Inspection No deformities, no ulcerations, no other skin breakdown bilaterally:  Yes Sensation Testing Intact to touch and monofilament testing bilaterally:  Yes Pulse Check Posterior Tibialis and Dorsalis pulse intact bilaterally:  Yes Comments    Depression Screen PHQ 2/9 Scores 10/17/2018 10/17/2018 09/27/2017 10/11/2016  PHQ - 2 Score 0 0 0 0  PHQ- 9 Score 1 - - -      Assessment & Plan:     Routine Health Maintenance and Physical Exam  Exercise Activities and Dietary recommendations Goals    . DIET - INCREASE WATER INTAKE     Recommend increasing water intake to 4-6 glasses a day.    Marland Kitchen DIET - REDUCE CARBOHYDRATES     Recommend to cut back on carbohydrates to 1 serving a day and to focus on eating more good carbohydrates.     . Increase water intake     Starting 10/11/16, I will increase my water intake to 4-6 glasses a day.        Immunization History  Administered Date(s) Administered  . Hepatitis B, adult 05/29/2012, 07/29/2012, 03/18/2013  . Influenza, High Dose Seasonal PF 07/06/2014, 05/06/2015, 07/11/2016, 04/26/2017, 04/24/2018  . Pneumococcal Conjugate-13 07/06/2014  . Pneumococcal Polysaccharide-23 05/29/2012    Health Maintenance  Topic Date Due  . TETANUS/TDAP  12/23/1966  . PNA vac Low Risk Adult (2 of 2 - PPSV23) 05/29/2017  . FOOT EXAM  04/26/2018  . HEMOGLOBIN A1C  10/23/2018  . OPHTHALMOLOGY EXAM  02/22/2019  . COLONOSCOPY  10/29/2019  . INFLUENZA VACCINE  Completed  . Hepatitis C Screening  Completed     Discussed  health benefits of physical activity, and encouraged him to engage in regular exercise appropriate for his age and condition.   1. Annual physical exam Normal physical exam today. Will check labs as below and f/u pending lab results. If labs are stable and WNL he will not need to have these rechecked for one year at his next annual physical exam. He is to call the office in the meantime if he has any acute issue, questions or concerns. - CBC w/Diff/Platelet - Comprehensive Metabolic Panel (CMET) - TSH - Lipid Profile  2. Prostate cancer screening Will check labs as below and f/u pending results. - PSA  3. Type 2 diabetes mellitus without complication, without long-term current use of insulin (HCC) Up slightly to 7.4 from 7.2. Continue medications as prescribed. Recheck in 6 months.  - POCT glycosylated hemoglobin (Hb A1C) - CBC w/Diff/Platelet - Comprehensive Metabolic Panel (CMET) - TSH - Lipid Profile - Pneumococcal polysaccharide vaccine 23-valent greater than or equal to 2yo subcutaneous/IM  4. Chest pain, unspecified type New onset chest pain/tightness with SOB on exertion. Reports noticing symptoms mostly if he has  to walk an incline at work. He will feel SOB and winded and occasionally have associated chest pain or tightness. When he stops pain improves and he is able to complete task. I will refer back to Cardiology, has seen Dr. Ubaldo Glassing in 2016, for further evaluation.  - Ambulatory referral to Cardiology - CBC w/Diff/Platelet - Comprehensive Metabolic Panel (CMET) - TSH - Lipid Profile  5. SOB (shortness of breath) on exertion See above medical treatment plan. - Ambulatory referral to Cardiology - CBC w/Diff/Platelet - Comprehensive Metabolic Panel (CMET) - TSH - Lipid Profile  6. Essential hypertension Slightly elevated today. Does not check BP at home. Recently had losartan-hctz combo changed to medications separately (losartan 130m and HCTZ 12.519m. Unsure if this  change even though dosing the same would cause the increase. Will recheck BP at next visit.  - CBC w/Diff/Platelet - Comprehensive Metabolic Panel (CMET) - TSH - Lipid Profile  7. Fatty liver disease, nonalcoholic Diet controlled. Will check labs as below and f/u pending results. - CBC w/Diff/Platelet - Comprehensive Metabolic Panel (CMET) - TSH - Lipid Profile  8. Pure hypercholesterolemia On simvastatin 2086mWill check labs as below and f/u pending results. - CBC w/Diff/Platelet - Comprehensive Metabolic Panel (CMET) - TSH - Lipid Profile  9. Need for pneumococcal vaccination Pneumococcal 23 Vaccine given to patient without complications. Patient sat for 15 minutes after administration and was tolerated well without adverse effects. - Pneumococcal polysaccharide vaccine 23-valent greater than or equal to 2yo subcutaneous/IM  --------------------------------------------------------------------

## 2018-10-31 DIAGNOSIS — E7800 Pure hypercholesterolemia, unspecified: Secondary | ICD-10-CM | POA: Diagnosis not present

## 2018-10-31 DIAGNOSIS — E119 Type 2 diabetes mellitus without complications: Secondary | ICD-10-CM | POA: Diagnosis not present

## 2018-10-31 DIAGNOSIS — I10 Essential (primary) hypertension: Secondary | ICD-10-CM | POA: Diagnosis not present

## 2018-10-31 DIAGNOSIS — R0602 Shortness of breath: Secondary | ICD-10-CM | POA: Diagnosis not present

## 2018-10-31 DIAGNOSIS — G4730 Sleep apnea, unspecified: Secondary | ICD-10-CM | POA: Diagnosis not present

## 2018-10-31 DIAGNOSIS — R079 Chest pain, unspecified: Secondary | ICD-10-CM | POA: Diagnosis not present

## 2018-10-31 DIAGNOSIS — R011 Cardiac murmur, unspecified: Secondary | ICD-10-CM | POA: Diagnosis not present

## 2018-10-31 DIAGNOSIS — M533 Sacrococcygeal disorders, not elsewhere classified: Secondary | ICD-10-CM | POA: Insufficient documentation

## 2018-10-31 LAB — CBC WITH DIFFERENTIAL/PLATELET
Basophils Absolute: 0 10*3/uL (ref 0.0–0.2)
Basos: 1 %
EOS (ABSOLUTE): 0.1 10*3/uL (ref 0.0–0.4)
Eos: 2 %
HEMATOCRIT: 39.7 % (ref 37.5–51.0)
HEMOGLOBIN: 13.8 g/dL (ref 13.0–17.7)
Immature Grans (Abs): 0 10*3/uL (ref 0.0–0.1)
Immature Granulocytes: 0 %
Lymphocytes Absolute: 1.7 10*3/uL (ref 0.7–3.1)
Lymphs: 27 %
MCH: 29.9 pg (ref 26.6–33.0)
MCHC: 34.8 g/dL (ref 31.5–35.7)
MCV: 86 fL (ref 79–97)
MONOCYTES: 8 %
Monocytes Absolute: 0.5 10*3/uL (ref 0.1–0.9)
Neutrophils Absolute: 3.8 10*3/uL (ref 1.4–7.0)
Neutrophils: 62 %
Platelets: 150 10*3/uL (ref 150–450)
RBC: 4.61 x10E6/uL (ref 4.14–5.80)
RDW: 13.4 % (ref 11.6–15.4)
WBC: 6.1 10*3/uL (ref 3.4–10.8)

## 2018-10-31 LAB — COMPREHENSIVE METABOLIC PANEL
ALT: 33 IU/L (ref 0–44)
AST: 26 IU/L (ref 0–40)
Albumin/Globulin Ratio: 2.7 — ABNORMAL HIGH (ref 1.2–2.2)
Albumin: 4.3 g/dL (ref 3.8–4.8)
Alkaline Phosphatase: 57 IU/L (ref 39–117)
BUN/Creatinine Ratio: 21 (ref 10–24)
BUN: 22 mg/dL (ref 8–27)
Bilirubin Total: 0.9 mg/dL (ref 0.0–1.2)
CO2: 24 mmol/L (ref 20–29)
CREATININE: 1.03 mg/dL (ref 0.76–1.27)
Calcium: 9.3 mg/dL (ref 8.6–10.2)
Chloride: 101 mmol/L (ref 96–106)
GFR calc Af Amer: 85 mL/min/{1.73_m2} (ref >59)
GFR calc non Af Amer: 73 mL/min/{1.73_m2} (ref >59)
GLOBULIN, TOTAL: 1.6 g/dL (ref 1.5–4.5)
Glucose: 146 mg/dL — ABNORMAL HIGH (ref 65–99)
Potassium: 3.4 mmol/L — ABNORMAL LOW (ref 3.5–5.2)
Sodium: 142 mmol/L (ref 134–144)
Total Protein: 5.9 g/dL — ABNORMAL LOW (ref 6.0–8.5)

## 2018-10-31 LAB — LIPID PANEL
CHOL/HDL RATIO: 2.8 ratio (ref 0.0–5.0)
CHOLESTEROL TOTAL: 108 mg/dL (ref 100–199)
HDL: 38 mg/dL — ABNORMAL LOW (ref >39)
LDL CALC: 50 mg/dL (ref 0–99)
Triglycerides: 98 mg/dL (ref 0–149)
VLDL Cholesterol Cal: 20 mg/dL (ref 5–40)

## 2018-10-31 LAB — TSH: TSH: 1.65 u[IU]/mL (ref 0.450–4.500)

## 2018-10-31 LAB — PSA: Prostate Specific Ag, Serum: 2.6 ng/mL (ref 0.0–4.0)

## 2018-11-01 ENCOUNTER — Telehealth: Payer: Self-pay

## 2018-11-01 NOTE — Telephone Encounter (Signed)
Pt advised.   Thanks,   -Laura  

## 2018-11-01 NOTE — Telephone Encounter (Signed)
-----   Message from Mar Daring, [place] sent at 11/01/2018  7:42 AM EDT ----- Blood count is normal. Kidney and liver function normal. Potassium is borderline low. Recommend to increase potassium rich foods such as bananas, sweet potatoes, broccoli, leafy greens, etc. Cholesterol is normal. Thyroid is normal. PSA is normal.

## 2018-11-12 DIAGNOSIS — R0602 Shortness of breath: Secondary | ICD-10-CM | POA: Diagnosis not present

## 2018-11-12 DIAGNOSIS — R079 Chest pain, unspecified: Secondary | ICD-10-CM | POA: Diagnosis not present

## 2018-11-14 DIAGNOSIS — R079 Chest pain, unspecified: Secondary | ICD-10-CM | POA: Diagnosis not present

## 2018-11-14 DIAGNOSIS — Z800 Family history of malignant neoplasm of digestive organs: Secondary | ICD-10-CM | POA: Diagnosis not present

## 2018-11-14 DIAGNOSIS — Z801 Family history of malignant neoplasm of trachea, bronchus and lung: Secondary | ICD-10-CM | POA: Diagnosis not present

## 2018-11-14 DIAGNOSIS — Z79890 Hormone replacement therapy (postmenopausal): Secondary | ICD-10-CM | POA: Diagnosis not present

## 2018-11-14 DIAGNOSIS — K7460 Unspecified cirrhosis of liver: Secondary | ICD-10-CM | POA: Diagnosis not present

## 2018-11-14 DIAGNOSIS — E785 Hyperlipidemia, unspecified: Secondary | ICD-10-CM | POA: Diagnosis not present

## 2018-11-14 DIAGNOSIS — R0602 Shortness of breath: Secondary | ICD-10-CM | POA: Diagnosis not present

## 2018-11-14 DIAGNOSIS — Z539 Procedure and treatment not carried out, unspecified reason: Secondary | ICD-10-CM | POA: Diagnosis not present

## 2018-11-14 DIAGNOSIS — E119 Type 2 diabetes mellitus without complications: Secondary | ICD-10-CM | POA: Diagnosis not present

## 2018-11-14 DIAGNOSIS — Z8249 Family history of ischemic heart disease and other diseases of the circulatory system: Secondary | ICD-10-CM | POA: Diagnosis not present

## 2018-11-14 DIAGNOSIS — Z79899 Other long term (current) drug therapy: Secondary | ICD-10-CM | POA: Diagnosis not present

## 2018-11-14 DIAGNOSIS — E7800 Pure hypercholesterolemia, unspecified: Secondary | ICD-10-CM | POA: Diagnosis not present

## 2018-11-14 DIAGNOSIS — Z8673 Personal history of transient ischemic attack (TIA), and cerebral infarction without residual deficits: Secondary | ICD-10-CM | POA: Diagnosis not present

## 2018-11-14 DIAGNOSIS — M1990 Unspecified osteoarthritis, unspecified site: Secondary | ICD-10-CM | POA: Diagnosis not present

## 2018-11-14 DIAGNOSIS — E039 Hypothyroidism, unspecified: Secondary | ICD-10-CM | POA: Diagnosis not present

## 2018-11-14 DIAGNOSIS — R188 Other ascites: Secondary | ICD-10-CM | POA: Diagnosis not present

## 2018-11-14 DIAGNOSIS — Z8542 Personal history of malignant neoplasm of other parts of uterus: Secondary | ICD-10-CM | POA: Diagnosis not present

## 2018-11-14 DIAGNOSIS — I10 Essential (primary) hypertension: Secondary | ICD-10-CM | POA: Diagnosis not present

## 2018-11-21 ENCOUNTER — Other Ambulatory Visit: Payer: Self-pay | Admitting: Physician Assistant

## 2018-11-21 DIAGNOSIS — E119 Type 2 diabetes mellitus without complications: Secondary | ICD-10-CM

## 2018-11-28 DIAGNOSIS — E119 Type 2 diabetes mellitus without complications: Secondary | ICD-10-CM | POA: Diagnosis not present

## 2018-11-28 DIAGNOSIS — R0602 Shortness of breath: Secondary | ICD-10-CM | POA: Diagnosis not present

## 2018-11-28 DIAGNOSIS — I10 Essential (primary) hypertension: Secondary | ICD-10-CM | POA: Diagnosis not present

## 2018-11-28 DIAGNOSIS — E7800 Pure hypercholesterolemia, unspecified: Secondary | ICD-10-CM | POA: Diagnosis not present

## 2018-12-05 ENCOUNTER — Telehealth: Payer: Self-pay | Admitting: Physician Assistant

## 2018-12-05 DIAGNOSIS — K219 Gastro-esophageal reflux disease without esophagitis: Secondary | ICD-10-CM

## 2018-12-05 NOTE — Telephone Encounter (Signed)
Pt called saying he was taken off of his acid reflux medication a while ago and he is having reflux and needs to be taking something again   [facility]  CB#  [phone]  Thanks Con Memos

## 2018-12-05 NOTE — Telephone Encounter (Signed)
Please advise 

## 2018-12-06 MED ORDER — OMEPRAZOLE 40 MG PO CPDR: 40.0000 mg | DELAYED_RELEASE_CAPSULE | Freq: Every day | ORAL | 1 refills | Status: DC

## 2018-12-06 NOTE — Telephone Encounter (Signed)
Patient was advised.  

## 2018-12-06 NOTE — Telephone Encounter (Signed)
Omeprazole 40 mg sent to [facility]

## 2018-12-08 ENCOUNTER — Other Ambulatory Visit: Payer: Self-pay | Admitting: Physician Assistant

## 2018-12-08 DIAGNOSIS — E119 Type 2 diabetes mellitus without complications: Secondary | ICD-10-CM

## 2018-12-19 ENCOUNTER — Other Ambulatory Visit: Payer: Self-pay | Admitting: Physician Assistant

## 2018-12-19 DIAGNOSIS — I10 Essential (primary) hypertension: Secondary | ICD-10-CM

## 2018-12-20 ENCOUNTER — Other Ambulatory Visit: Payer: Self-pay | Admitting: Physician Assistant

## 2018-12-20 DIAGNOSIS — I10 Essential (primary) hypertension: Secondary | ICD-10-CM

## 2019-01-01 DIAGNOSIS — G4733 Obstructive sleep apnea (adult) (pediatric): Secondary | ICD-10-CM | POA: Diagnosis not present

## 2019-02-09 ENCOUNTER — Other Ambulatory Visit: Payer: Self-pay | Admitting: Physician Assistant

## 2019-02-09 DIAGNOSIS — K580 Irritable bowel syndrome with diarrhea: Secondary | ICD-10-CM

## 2019-03-06 DIAGNOSIS — E119 Type 2 diabetes mellitus without complications: Secondary | ICD-10-CM | POA: Diagnosis not present

## 2019-03-06 DIAGNOSIS — E7800 Pure hypercholesterolemia, unspecified: Secondary | ICD-10-CM | POA: Diagnosis not present

## 2019-03-06 DIAGNOSIS — R0602 Shortness of breath: Secondary | ICD-10-CM | POA: Diagnosis not present

## 2019-03-06 DIAGNOSIS — I10 Essential (primary) hypertension: Secondary | ICD-10-CM | POA: Diagnosis not present

## 2019-03-27 DIAGNOSIS — L718 Other rosacea: Secondary | ICD-10-CM | POA: Diagnosis not present

## 2019-03-27 DIAGNOSIS — Z85828 Personal history of other malignant neoplasm of skin: Secondary | ICD-10-CM | POA: Diagnosis not present

## 2019-03-27 DIAGNOSIS — Z872 Personal history of diseases of the skin and subcutaneous tissue: Secondary | ICD-10-CM | POA: Diagnosis not present

## 2019-03-27 DIAGNOSIS — L570 Actinic keratosis: Secondary | ICD-10-CM | POA: Diagnosis not present

## 2019-03-27 DIAGNOSIS — L578 Other skin changes due to chronic exposure to nonionizing radiation: Secondary | ICD-10-CM | POA: Diagnosis not present

## 2019-05-02 ENCOUNTER — Ambulatory Visit: Payer: PPO | Admitting: Physician Assistant

## 2019-05-06 NOTE — Progress Notes (Signed)
Patient: Ivan Kennedy Male    DOB: [birth year]   73 y.o.   MRN: [id number] Visit Date: 05/08/2019  Today's Provider: Mar Daring, PA-C   Chief Complaint  Patient presents with  . Follow-up  . Diabetes  . Hypertension   Subjective:     HPI    Diabetes Mellitus Type II, Follow-up:   Lab Results  Component Value Date   HGBA1C 7.7 (A) 05/08/2019   HGBA1C 7.4 (A) 10/30/2018   HGBA1C 7.2 (A) 04/24/2018   Last seen for diabetes 6 months ago.  Management since then includes; no changes. He reports excellent compliance with treatment. He is not having side effects.  Current symptoms include none and have been stable. Home blood sugar records: fasting range: 120-180  Episodes of hypoglycemia? no   Most Recent Eye Exam: 10/2018 [facility] Weight trend: stable Current diet: well balanced Current exercise: walking  -----------------------------------------------------------   Hypertension, follow-up:  BP Readings from Last 3 Encounters:  05/08/19 (!) 155/82  10/30/18 (!) 154/85  10/17/18 130/72    He was last seen for hypertension 6 months ago.  BP at that visit was 154/85. Management since that visit includes; no changes.He reports excellent compliance with treatment. He is not having side effects. He is exercising. He is adherent to low salt diet.   Outside blood pressures are n/a. He is experiencing none.  Patient denies chest pain, chest pressure/discomfort, exertional chest pressure/discomfort, fatigue, irregular heart beat, lower extremity edema, near-syncope and palpitations.   Cardiovascular risk factors include advanced age (older than 36 for men, 4 for women), diabetes mellitus, dyslipidemia, hypertension and male gender.   ----------------------------------------------------------- Patient c/o abdominal discomfort every time he eats. Reports that is getting to the point that he is scared to eat because if he goes out to a restaurant he needs to  be close to a restroom. His stool is watery. No blood or mucus. He does report being under increased stress due to remodeling his home and having to live in other places and keep moving around while the home was remodeled. Reports he just moved back in this week.   Patient c/o right foot swelling. He fell 2 weeks ago going down some steps. Does not think he fractured anything and reports it is slowly improving. Did have discoloration on the medial aspect of the foot that has now resolved. Does wear an ankle brace when up on it. Swelling worsens the more he stands on it. Does go down with elevation.    No Known Allergies   Current Outpatient Medications:  .  aspirin EC 81 MG tablet, Take 81 mg by mouth daily. , Disp: , Rfl:  .  Blood Glucose Monitoring Suppl (ONE TOUCH ULTRA 2) w/Device KIT, To check blood sugar once daily, Disp: 1 each, Rfl: 0 .  doxycycline (ADOXA) 50 MG tablet, Take 1 tablet (50 mg total) by mouth daily., Disp: 90 tablet, Rfl: 1 .  glucose blood test strip, TO CHECK BLOOD SUGAR ONCE DAILY, Disp: 100 each, Rfl: 3 .  Lancets (ACCU-CHEK SOFT TOUCH) lancets, To check blood glucose daily, Disp: 100 each, Rfl: 12 .  loratadine-pseudoephedrine (CLARITIN-D 24-HOUR) 10-240 MG per 24 hr tablet, Take 1 tablet by mouth daily as needed for allergies., Disp: , Rfl:  .  losartan (COZAAR) 100 MG tablet, TAKE 1 TABLET (100 MG TOTAL) BY MOUTH DAILY., Disp: 90 tablet, Rfl: 1 .  metFORMIN (GLUCOPHAGE) 1000 MG tablet, TAKE 1 TABLET BY MOUTH TWICE  A DAY, Disp: 180 tablet, Rfl: 1 .  metoprolol succinate (TOPROL-XL) 25 MG 24 hr tablet, 1 TABLET BY MOUTH EVERY DAY, Disp: 90 tablet, Rfl: 1 .  metroNIDAZOLE (METROGEL) 1 % gel, APPLY TO ROSACEA ONCE DAILY., Disp: , Rfl: 5 .  omeprazole (PRILOSEC) 40 MG capsule, Take 1 capsule (40 mg total) by mouth daily., Disp: 90 capsule, Rfl: 1 .  simvastatin (ZOCOR) 20 MG tablet, TAKE 1 TABLET BY MOUTH EVERY DAY IN THE EVENING, Disp: 90 tablet, Rfl: 3 .  tretinoin  (RETIN-A) 0.05 % cream, Apply 1 application topically at bedtime. , Disp: , Rfl: 5 .  vitamin B-12 (CYANOCOBALAMIN) 1000 MCG tablet, Take 1,000 mcg by mouth daily., Disp: , Rfl:  .  dicyclomine (BENTYL) 20 MG tablet, Take 1 tablet (20 mg total) by mouth 4 (four) times daily -  before meals and at bedtime., Disp: 120 tablet, Rfl: 3 .  glipiZIDE (GLUCOTROL) 10 MG tablet, Take 1 tablet (10 mg total) by mouth 2 (two) times daily before a meal., Disp: 180 tablet, Rfl: 1 .  hydrochlorothiazide (HYDRODIURIL) 25 MG tablet, Take 1 tablet (25 mg total) by mouth daily., Disp: 90 tablet, Rfl: 1  Review of Systems  Constitutional: Negative for appetite change, chills and fever.  Eyes: Negative for visual disturbance.  Respiratory: Negative for chest tightness, shortness of breath and wheezing.   Cardiovascular: Negative for chest pain and palpitations.  Gastrointestinal: Positive for abdominal distention and diarrhea. Negative for abdominal pain, nausea and vomiting.  Musculoskeletal: Positive for arthralgias and joint swelling.  Neurological: Negative for dizziness and headaches.    Social History   Tobacco Use  . Smoking status: Never Smoker  . Smokeless tobacco: Never Used  Substance Use Topics  . Alcohol use: Yes    Alcohol/week: 0.0 standard drinks    Comment: occasionally- beer 1-2/month      Objective:   BP (!) 155/82 (BP Location: Left Arm, Patient Position: Sitting, Cuff Size: Large)   Pulse (!) 56   Temp (!) 97.3 F (36.3 C) (Other (Comment))   Resp 16   Wt 236 lb 6.4 oz (107.2 kg)   BMI 31.19 kg/m  Vitals:   05/08/19 0815  BP: (!) 155/82  Pulse: (!) 56  Resp: 16  Temp: (!) 97.3 F (36.3 C)  TempSrc: Other (Comment)  Weight: 236 lb 6.4 oz (107.2 kg)  Body mass index is 31.19 kg/m.   Physical Exam Vitals signs reviewed.  Constitutional:      General: He is not in acute distress.    Appearance: Normal appearance. He is well-developed. He is obese. He is not  ill-appearing or diaphoretic.  HENT:     Head: Normocephalic and atraumatic.  Neck:     Musculoskeletal: Normal range of motion and neck supple.     Thyroid: No thyromegaly.     Vascular: No JVD.     Trachea: No tracheal deviation.  Cardiovascular:     Rate and Rhythm: Normal rate and regular rhythm.     Pulses: Normal pulses.     Heart sounds: Normal heart sounds. No murmur. No friction rub. No gallop.   Pulmonary:     Effort: Pulmonary effort is normal. No respiratory distress.     Breath sounds: Normal breath sounds. No wheezing or rales.  Abdominal:     General: Abdomen is flat. Bowel sounds are increased.     Tenderness: There is generalized abdominal tenderness.  Musculoskeletal:     Right ankle: He exhibits swelling. He  exhibits normal range of motion, no ecchymosis, no deformity and normal pulse. No tenderness. Achilles tendon normal.       Feet:  Lymphadenopathy:     Cervical: No cervical adenopathy.  Neurological:     Mental Status: He is alert.      Results for orders placed or performed in visit on 05/08/19  POCT glycosylated hemoglobin (Hb A1C)  Result Value Ref Range   Hemoglobin A1C 7.7 (A) 4.0 - 5.6 %   Est. average glucose Bld gHb Est-mCnc 174        Assessment & Plan    1. Type 2 diabetes mellitus without complication, without long-term current use of insulin (HCC) A1c increased again. Will continue metformin 101m BID and increase glipizide to 124mBID. I will see him back in 3 months to recheck.  - glipiZIDE (GLUCOTROL) 10 MG tablet; Take 1 tablet (10 mg total) by mouth 2 (two) times daily before a meal.  Dispense: 180 tablet; Refill: 1  2. Essential hypertension Elevated. Increase HCTZ to 2557mrom 12.5mg78montinue metoprolol XR 25mg55mse most likely can not be increased due to bradycardia. Return in 3 months. - hydrochlorothiazide (HYDRODIURIL) 25 MG tablet; Take 1 tablet (25 mg total) by mouth daily.  Dispense: 90 tablet; Refill: 1  3.  Irritable bowel syndrome with diarrhea Worsening on Lomotil. Will change to Bentyl as below. Call if not improving and may consider Viberzi.  - dicyclomine (BENTYL) 20 MG tablet; Take 1 tablet (20 mg total) by mouth 4 (four) times daily -  before meals and at bedtime.  Dispense: 120 tablet; Refill: 3  4. Sprain of deltoid ligament of right ankle, initial encounter Improving. Advised to continue RICE treatment.   5. Need for influenza vaccination Flu vaccine given today without complication. Patient sat upright for 15 minutes to check for adverse reaction before being released - Flu Vaccine QUAD High Dose(Fluad)     JenniMar DaringC  [facility]p

## 2019-05-08 ENCOUNTER — Encounter: Payer: Self-pay | Admitting: Physician Assistant

## 2019-05-08 ENCOUNTER — Other Ambulatory Visit: Payer: Self-pay

## 2019-05-08 ENCOUNTER — Ambulatory Visit (INDEPENDENT_AMBULATORY_CARE_PROVIDER_SITE_OTHER): Payer: PPO | Admitting: Physician Assistant

## 2019-05-08 VITALS — BP 155/82 | HR 56 | Temp 97.3°F | Resp 16 | Wt 236.4 lb

## 2019-05-08 DIAGNOSIS — K580 Irritable bowel syndrome with diarrhea: Secondary | ICD-10-CM | POA: Diagnosis not present

## 2019-05-08 DIAGNOSIS — E119 Type 2 diabetes mellitus without complications: Secondary | ICD-10-CM

## 2019-05-08 DIAGNOSIS — S93421A Sprain of deltoid ligament of right ankle, initial encounter: Secondary | ICD-10-CM

## 2019-05-08 DIAGNOSIS — Z23 Encounter for immunization: Secondary | ICD-10-CM

## 2019-05-08 DIAGNOSIS — I10 Essential (primary) hypertension: Secondary | ICD-10-CM

## 2019-05-08 LAB — POCT GLYCOSYLATED HEMOGLOBIN (HGB A1C)
Est. average glucose Bld gHb Est-mCnc: 174
Hemoglobin A1C: 7.7 % — AB (ref 4.0–5.6)

## 2019-05-08 MED ORDER — HYDROCHLOROTHIAZIDE 12.5 MG PO CAPS: ORAL_CAPSULE | ORAL | 1 refills | Status: DC

## 2019-05-08 MED ORDER — GLIPIZIDE ER 5 MG PO TB24: 5.0000 mg | ORAL_TABLET | Freq: Two times a day (BID) | ORAL | 1 refills | Status: DC

## 2019-05-08 MED ORDER — HYDROCHLOROTHIAZIDE 25 MG PO TABS: 25.0000 mg | ORAL_TABLET | Freq: Every day | ORAL | 1 refills | Status: DC

## 2019-05-08 MED ORDER — DICYCLOMINE HCL 20 MG PO TABS: 20.0000 mg | ORAL_TABLET | Freq: Three times a day (TID) | ORAL | 3 refills | Status: DC

## 2019-05-08 MED ORDER — GLIPIZIDE 10 MG PO TABS: 10.0000 mg | ORAL_TABLET | Freq: Two times a day (BID) | ORAL | 1 refills | Status: DC

## 2019-05-08 NOTE — Patient Instructions (Signed)
Dicyclomine tablets or capsules What is this medicine? DICYCLOMINE (dye SYE kloe meen) is used to treat bowel problems including irritable bowel syndrome. This medicine may be used for other purposes; ask your health care provider or pharmacist if you have questions. COMMON BRAND NAME(S): Bentyl What should I tell my health care provider before I take this medicine? They need to know if you have any of these conditions:  difficulty passing urine  esophagus problems or heartburn  glaucoma  heart disease, or previous heart attack  myasthenia gravis  prostate trouble  stomach infection, or obstruction  ulcerative colitis  an unusual or allergic reaction to dicyclomine, other medicines, foods, dyes, or preservatives  pregnant or trying to get pregnant  breast-feeding How should I use this medicine? Take this medicine by mouth with a glass of water. Follow the directions on the prescription label. It is best to take this medicine on an empty stomach, 30 minutes to 1 hour before meals. Take your medicine at regular intervals. Do not take your medicine more often than directed. Talk to your pediatrician regarding the use of this medicine in children. Special care may be needed. While this drug may be prescribed for children as young as 47 months of age for selected conditions, precautions do apply. Patients over 50 years old may have a stronger reaction and need a smaller dose. Overdosage: If you think you have taken too much of this medicine contact a poison control center or emergency room at once. NOTE: This medicine is only for you. Do not share this medicine with others. What if I miss a dose? If you miss a dose, take it as soon as you can. If it is almost time for your next dose, take only that dose. Do not take double or extra doses. What may interact with this medicine?  amantadine  antacids  benztropine  digoxin  disopyramide  medicines for allergies, colds and  breathing difficulties  medicines for alzheimer's disease  medicines for anxiety or sleeping problems  medicines for depression or psychotic disturbances  medicines for diarrhea  medicines for pain  metoclopramide  tegaserod This list may not describe all possible interactions. Give your health care provider a list of all the medicines, herbs, non-prescription drugs, or dietary supplements you use. Also tell them if you smoke, drink alcohol, or use illegal drugs. Some items may interact with your medicine. What should I watch for while using this medicine? You may get drowsy, dizzy, or have blurred vision. Do not drive, use machinery, or do anything that needs mental alertness until you know how this medicine affects you. To reduce the risk of dizzy or fainting spells, do not sit or stand up quickly, especially if you are an older patient. Alcohol can make you more drowsy, avoid alcoholic drinks. Stay out of bright light and wear sunglasses if this medicine makes your eyes more sensitive to light. Avoid extreme heat (hot tubs, saunas). This medicine can cause you to sweat less than normal. Your body temperature could increase to dangerous levels, which may lead to heat stroke. Antacids can stop this medicine from working. If you get an upset stomach and want to take an antacid, make sure there is an interval of at least 1 to 2 hours before or after you take this medicine. Your mouth may get dry. Chewing sugarless gum or sucking hard candy, and drinking plenty of water may help. Contact your doctor if the problem does not go away or is severe. What  side effects may I notice from receiving this medicine? Side effects that you should report to your doctor or health care professional as soon as possible:  agitation, nervousness, confusion  difficulty swallowing  dizziness, drowsiness  fast or slow heartbeat  hallucinations  pain or difficulty passing urine Side effects that usually do  not require medical attention (report to your doctor or health care professional if they continue or are bothersome):  constipation  headache  nausea or vomiting  sexual difficulty This list may not describe all possible side effects. Call your doctor for medical advice about side effects. You may report side effects to FDA at 1-800-FDA-1088. Where should I keep my medicine? Keep out of the reach of children. Store at room temperature below 30 degrees C (86 degrees F). Protect from light. Throw away any unused medicine after the expiration date. NOTE: This sheet is a summary. It may not cover all possible information. If you have questions about this medicine, talk to your doctor, pharmacist, or health care provider.  2020 Elsevier/Gold Standard (2007-11-19 17:12:34)

## 2019-05-24 ENCOUNTER — Other Ambulatory Visit: Payer: Self-pay | Admitting: Physician Assistant

## 2019-05-24 DIAGNOSIS — E119 Type 2 diabetes mellitus without complications: Secondary | ICD-10-CM

## 2019-06-18 ENCOUNTER — Other Ambulatory Visit: Payer: Self-pay | Admitting: Physician Assistant

## 2019-06-18 DIAGNOSIS — I10 Essential (primary) hypertension: Secondary | ICD-10-CM

## 2019-06-29 ENCOUNTER — Other Ambulatory Visit: Payer: Self-pay | Admitting: Physician Assistant

## 2019-06-29 DIAGNOSIS — K219 Gastro-esophageal reflux disease without esophagitis: Secondary | ICD-10-CM

## 2019-07-07 DIAGNOSIS — L570 Actinic keratosis: Secondary | ICD-10-CM | POA: Diagnosis not present

## 2019-07-07 DIAGNOSIS — D0461 Carcinoma in situ of skin of right upper limb, including shoulder: Secondary | ICD-10-CM | POA: Diagnosis not present

## 2019-07-07 DIAGNOSIS — C44622 Squamous cell carcinoma of skin of right upper limb, including shoulder: Secondary | ICD-10-CM | POA: Diagnosis not present

## 2019-07-07 DIAGNOSIS — D485 Neoplasm of uncertain behavior of skin: Secondary | ICD-10-CM | POA: Diagnosis not present

## 2019-07-24 DIAGNOSIS — C44222 Squamous cell carcinoma of skin of right ear and external auricular canal: Secondary | ICD-10-CM | POA: Diagnosis not present

## 2019-07-24 DIAGNOSIS — C44622 Squamous cell carcinoma of skin of right upper limb, including shoulder: Secondary | ICD-10-CM | POA: Diagnosis not present

## 2019-07-30 NOTE — Progress Notes (Signed)
Patient: Ivan Kennedy Male    DOB: [birth year]   73 y.o.   MRN: [id number] Visit Date: 07/31/2019  Today's Provider: Mar Daring, PA-C   Chief Complaint  Patient presents with  . Follow-up    DM,HTN   Subjective:     HPI   Diabetes Mellitus Type II, Follow-up:   Lab Results  Component Value Date   HGBA1C 7.2 (A) 07/31/2019   HGBA1C 7.7 (A) 05/08/2019   HGBA1C 7.4 (A) 10/30/2018    Last seen for diabetes 3 months ago.  Management since then includes continue metformin 1075m BID and increase glipizide to 116mBID He reports excellent compliance with treatment. He is not having side effects.  Current symptoms include none and have been stable. Home blood sugar records: fasting range: 114-195  Episodes of hypoglycemia? no   Current insulin regiment: Is not on insulin Most Recent Eye Exam: last year Weight trend: stable Current exercise: walking Current diet habits: well balanced  Pertinent Labs:    Component Value Date/Time   CHOL 108 10/30/2018 1017   TRIG 98 10/30/2018 1017   HDL 38 (L) 10/30/2018 1017   LDLCALC 50 10/30/2018 1017   LDLCALC 38 04/26/2017 0902   CREATININE 1.03 10/30/2018 1017   CREATININE 1.00 04/26/2017 0902    Wt Readings from Last 3 Encounters:  07/31/19 234 lb 3.2 oz (106.2 kg)  05/08/19 236 lb 6.4 oz (107.2 kg)  10/30/18 238 lb 12.8 oz (108.3 kg)    ------------------------------------------------------------------------  Hypertension, follow-up:  BP Readings from Last 3 Encounters:  07/31/19 135/78  05/08/19 (!) 155/82  10/30/18 (!) 154/85    He was last seen for hypertension 3 months ago.  BP at that visit was 155/82. Management since that visit includes Increase HCTZ to 2512mrom 12.5mg47montinue metoprolol XR 25mg67mse most likely can not be increased due to bradycardia.. He Marland Kitcheneports excellent compliance with treatment. He is not having side effects.  He is exercising. He is adherent to low salt diet.    Outside blood pressures are n/a. He is experiencing none.  Patient denies chest pain, chest pressure/discomfort, exertional chest pressure/discomfort, fatigue, irregular heart beat, lower extremity edema, near-syncope and palpitations.   Cardiovascular risk factors include advanced age (older than 55 fo43men, 65 fo76women), diabetes mellitus, dyslipidemia, hypertension and male gender.   Patient c/o of neck pain,left side radiating to his shoulder.Started in the last 3-4 weeks.Taken IBU.   No Known Allergies   Current Outpatient Medications:  .  aspirin EC 81 MG tablet, Take 81 mg by mouth daily. , Disp: , Rfl:  .  Blood Glucose Monitoring Suppl (ONE TOUCH ULTRA 2) w/Device KIT, To check blood sugar once daily, Disp: 1 each, Rfl: 0 .  dicyclomine (BENTYL) 20 MG tablet, Take 1 tablet (20 mg total) by mouth 4 (four) times daily -  before meals and at bedtime., Disp: 120 tablet, Rfl: 3 .  doxycycline (ADOXA) 50 MG tablet, Take 1 tablet (50 mg total) by mouth daily., Disp: 90 tablet, Rfl: 1 .  glipiZIDE (GLUCOTROL) 10 MG tablet, Take 1 tablet (10 mg total) by mouth 2 (two) times daily before a meal., Disp: 180 tablet, Rfl: 1 .  glucose blood test strip, TO CHECK BLOOD SUGAR ONCE DAILY, Disp: 100 each, Rfl: 3 .  hydrochlorothiazide (HYDRODIURIL) 25 MG tablet, Take 1 tablet (25 mg total) by mouth daily., Disp: 90 tablet, Rfl: 1 .  Lancets (ACCU-CHEK SOFT TOUCH) lancets, To  check blood glucose daily, Disp: 100 each, Rfl: 12 .  loratadine-pseudoephedrine (CLARITIN-D 24-HOUR) 10-240 MG per 24 hr tablet, Take 1 tablet by mouth daily as needed for allergies., Disp: , Rfl:  .  losartan (COZAAR) 100 MG tablet, TAKE 1 TABLET (100 MG TOTAL) BY MOUTH DAILY., Disp: 90 tablet, Rfl: 1 .  metFORMIN (GLUCOPHAGE) 1000 MG tablet, TAKE 1 TABLET BY MOUTH TWICE A DAY, Disp: 180 tablet, Rfl: 1 .  metoprolol succinate (TOPROL-XL) 25 MG 24 hr tablet, 1 TABLET BY MOUTH EVERY DAY, Disp: 90 tablet, Rfl: 1 .   metroNIDAZOLE (METROGEL) 1 % gel, APPLY TO ROSACEA ONCE DAILY., Disp: , Rfl: 5 .  omeprazole (PRILOSEC) 40 MG capsule, TAKE 1 CAPSULE BY MOUTH EVERY DAY, Disp: 90 capsule, Rfl: 0 .  simvastatin (ZOCOR) 20 MG tablet, TAKE 1 TABLET BY MOUTH EVERY DAY IN THE EVENING, Disp: 90 tablet, Rfl: 3 .  tretinoin (RETIN-A) 0.05 % cream, Apply 1 application topically at bedtime. , Disp: , Rfl: 5 .  vitamin B-12 (CYANOCOBALAMIN) 1000 MCG tablet, Take 1,000 mcg by mouth daily., Disp: , Rfl:   Review of Systems  Respiratory: Negative for chest tightness, shortness of breath and wheezing.   Cardiovascular: Negative for chest pain, palpitations and leg swelling.  Musculoskeletal: Positive for neck pain.  Neurological: Negative for light-headedness and headaches.    Social History   Tobacco Use  . Smoking status: Never Smoker  . Smokeless tobacco: Never Used  Substance Use Topics  . Alcohol use: Yes    Alcohol/week: 0.0 standard drinks    Comment: occasionally- beer 1-2/month      Objective:   BP 135/78 (BP Location: Left Arm, Patient Position: Sitting, Cuff Size: Large)   Pulse 66   Temp (!) 97.3 F (36.3 C) (Temporal)   Resp 16   Ht '6\' 1"'$  (1.854 m)   Wt 234 lb 3.2 oz (106.2 kg)   BMI 30.90 kg/m  Vitals:   07/31/19 0817  BP: 135/78  Pulse: 66  Resp: 16  Temp: (!) 97.3 F (36.3 C)  TempSrc: Temporal  Weight: 234 lb 3.2 oz (106.2 kg)  Height: '6\' 1"'$  (1.854 m)  Body mass index is 30.9 kg/m.   Physical Exam Vitals reviewed.  Constitutional:      General: He is not in acute distress.    Appearance: Normal appearance. He is well-developed. He is obese. He is not ill-appearing or diaphoretic.  HENT:     Head: Normocephalic and atraumatic.  Cardiovascular:     Rate and Rhythm: Normal rate and regular rhythm.     Pulses: Normal pulses.     Heart sounds: Normal heart sounds. No murmur. No friction rub. No gallop.   Pulmonary:     Effort: Pulmonary effort is normal. No respiratory  distress.     Breath sounds: Normal breath sounds. No wheezing or rales.  Musculoskeletal:     Cervical back: Normal range of motion and neck supple.     Right lower leg: No edema.     Left lower leg: No edema.  Neurological:     Mental Status: He is alert.      Results for orders placed or performed in visit on 07/31/19  POCT HgB A1C  Result Value Ref Range   Hemoglobin A1C 7.2 (A) 4.0 - 5.6 %   Est. average glucose Bld gHb Est-mCnc 160        Assessment & Plan    1. Type 2 diabetes mellitus without complication, without long-term  current use of insulin (HCC) A1c much improved to 7.2 from 7.7. Has lost a couple pounds as well. Continue Glipizide 75m BID and metformin 10025mBID. Return in 3 months for CPE.   2. Irritable bowel syndrome with diarrhea Failed Imodium and Bentyl. Will try Xifaxan as below. If still no resolution may either use combination of Imodium and Bentyl due to cost or may try alosetron (may be cost prohibitive-epic shows $75 per 30 days). Call if not improving.  - rifaximin (XIFAXAN) 550 MG TABS tablet; Take 1 tablet (550 mg total) by mouth 3 (three) times daily.  Dispense: 42 tablet; Refill: 0  3. Essential hypertension Much improved today. Limiting salt in diet. Continue HCTZ 2522mlosartan 100m55mnd metoprolol XR 25mg51m  JenniMar DaringC  [facility]

## 2019-07-31 ENCOUNTER — Encounter: Payer: Self-pay | Admitting: Physician Assistant

## 2019-07-31 ENCOUNTER — Ambulatory Visit (INDEPENDENT_AMBULATORY_CARE_PROVIDER_SITE_OTHER): Payer: PPO | Admitting: Physician Assistant

## 2019-07-31 ENCOUNTER — Other Ambulatory Visit: Payer: Self-pay

## 2019-07-31 VITALS — BP 135/78 | HR 66 | Temp 97.3°F | Resp 16 | Ht 73.0 in | Wt 234.2 lb

## 2019-07-31 DIAGNOSIS — I10 Essential (primary) hypertension: Secondary | ICD-10-CM | POA: Diagnosis not present

## 2019-07-31 DIAGNOSIS — E119 Type 2 diabetes mellitus without complications: Secondary | ICD-10-CM

## 2019-07-31 DIAGNOSIS — K580 Irritable bowel syndrome with diarrhea: Secondary | ICD-10-CM

## 2019-07-31 LAB — POCT GLYCOSYLATED HEMOGLOBIN (HGB A1C)
Est. average glucose Bld gHb Est-mCnc: 160
Hemoglobin A1C: 7.2 % — AB (ref 4.0–5.6)

## 2019-07-31 MED ORDER — RIFAXIMIN 550 MG PO TABS: 550.0000 mg | ORAL_TABLET | Freq: Three times a day (TID) | ORAL | 0 refills | Status: DC

## 2019-07-31 NOTE — Patient Instructions (Signed)
Rifaximin tablets What is this medicine? RIFAXIMIN (ri FAX i men) is an antibiotic. It is used to treat traveler's diarrhea and irritable bowel syndrome with diarrhea. It is also used to prevent hepatic encephalopathy, a brain disorder that can occur due to severe liver disease. This medicine may be used for other purposes; ask your health care provider or pharmacist if you have questions. COMMON BRAND NAME(S): Xifaxan What should I tell my health care provider before I take this medicine? They need to know if you have any of these conditions:  bloody or tarry stools  fever  liver disease  an unusual or allergic reaction to rifaximin, rifampin, rifabutin, other medicines, foods, dyes, or preservatives  pregnant or trying to get pregnant  breast-feeding How should I use this medicine? Take this medicine by mouth with a glass of water. Follow the directions on the prescription label. This medicine may be taken with or without food. Take your medicine at regular intervals. Do not take your medicine more often than directed. Take all of your medicine as directed even if you think your are better. Do not skip doses or stop your medicine early. Talk to your pediatrician regarding the use of this medicine in children. Special care may be needed. Overdosage: If you think you have taken too much of this medicine contact a poison control center or emergency room at once. NOTE: This medicine is only for you. Do not share this medicine with others. What if I miss a dose? If you miss a dose, take it as soon as you can. If it is almost time for your next dose, take only that dose. Do not take double or extra doses. What may interact with this medicine? This medicine may interact with the following medications:  birth control pills  cyclosporine  midazolam  verapamil  warfarin This list may not describe all possible interactions. Give your health care provider a list of all the medicines,  herbs, non-prescription drugs, or dietary supplements you use. Also tell them if you smoke, drink alcohol, or use illegal drugs. Some items may interact with your medicine. What should I watch for while using this medicine? Tell your doctor or healthcare professional if your symptoms do not start to get better or if they get worse. Do not treat diarrhea with over the counter products. Contact your doctor if you have diarrhea that lasts more than 2 days or if it is severe and watery. What side effects may I notice from receiving this medicine? Side effects that you should report to your doctor or health care professional as soon as possible:  allergic reactions like skin rash, itching or hives, swelling of the face, lips, or tongue  blood in the urine  bloody or watery diarrhea  fever Side effects that usually do not require medical attention (report to your doctor or health care professional if they continue or are bothersome):  constipation  headache  nausea, vomiting  stomach bloating, gas  urgent bowel movements This list may not describe all possible side effects. Call your doctor for medical advice about side effects. You may report side effects to FDA at 1-800-FDA-1088. Where should I keep my medicine? Keep out of the reach of children. Store at room temperature between 15 and 30 degrees C (59 and 86 degrees F). Throw away any unused medicine after the expiration date. NOTE: This sheet is a summary. It may not cover all possible information. If you have questions about this medicine, talk to your  doctor, pharmacist, or health care provider.  2020 Elsevier/Gold Standard (2016-08-02 10:31:29)

## 2019-08-01 ENCOUNTER — Other Ambulatory Visit: Payer: Self-pay | Admitting: Physician Assistant

## 2019-08-01 ENCOUNTER — Telehealth: Payer: Self-pay | Admitting: Physician Assistant

## 2019-08-01 DIAGNOSIS — K580 Irritable bowel syndrome with diarrhea: Secondary | ICD-10-CM

## 2019-08-01 NOTE — Telephone Encounter (Signed)
Pt does not want to pay 500.00 for rifaximin and would like something else. [facility]

## 2019-08-04 NOTE — Telephone Encounter (Signed)
Can we try a PA for this please. He has failed Imodium and Bentyl.

## 2019-08-05 NOTE — Telephone Encounter (Signed)
Information regarding your request Available without authorization.  This is the response I received from [employer].

## 2019-08-05 NOTE — Telephone Encounter (Signed)
LMTCB  OK for the [facility] to relay message.

## 2019-08-05 NOTE — Telephone Encounter (Signed)
Can we see if he wants to try the one that was $75 or if he wants to stay on Bentyl?

## 2019-08-06 MED ORDER — ALOSETRON HCL 1 MG PO TABS: 1.0000 mg | ORAL_TABLET | Freq: Two times a day (BID) | ORAL | 1 refills | Status: DC

## 2019-08-06 NOTE — Telephone Encounter (Signed)
Pt states he is willing to try the one for $75.00.  [id number] - [place], [place] - [address] Phone:  [phone]  Fax:  [phone]

## 2019-08-06 NOTE — Telephone Encounter (Signed)
Please advise 

## 2019-08-06 NOTE — Addendum Note (Signed)
Addended by: Mar Daring on: 08/06/2019 08:27 AM   Modules accepted: Orders

## 2019-08-06 NOTE — Telephone Encounter (Signed)
Sent in for him. If this works better and he likes this I will send more.

## 2019-08-06 NOTE — Telephone Encounter (Signed)
Left detailed message for patient on voicemail.

## 2019-08-11 ENCOUNTER — Other Ambulatory Visit: Payer: Self-pay | Admitting: Physician Assistant

## 2019-08-11 DIAGNOSIS — K580 Irritable bowel syndrome with diarrhea: Secondary | ICD-10-CM

## 2019-08-11 NOTE — Addendum Note (Signed)
Addended by: Mar Daring on: 08/11/2019 09:57 AM   Modules accepted: Orders

## 2019-08-11 NOTE — Telephone Encounter (Signed)
Please advise 

## 2019-08-11 NOTE — Telephone Encounter (Signed)
Patient advised as below.  

## 2019-08-11 NOTE — Telephone Encounter (Signed)
Pt states when he went to the pharmacy the medication was over $700.  Pt states it was supposed to be $75.  Pt does not know what med she was to call in this last time.  But it is a 14 day supply for his stomach issue.  Pt would like a call back this am, he is leaving at noon and will be gone all week.

## 2019-08-11 NOTE — Telephone Encounter (Signed)
I sent in Alosetron 1mg . He is to take BID. I sent in 60 tabs with 1 refill.

## 2019-08-18 MED ORDER — DICYCLOMINE HCL 20 MG PO TABS: 20.0000 mg | ORAL_TABLET | Freq: Three times a day (TID) | ORAL | 1 refills | Status: DC

## 2019-08-18 NOTE — Addendum Note (Signed)
Addended by: Mar Daring on: 08/18/2019 09:35 AM   Modules accepted: Orders

## 2019-08-18 NOTE — Telephone Encounter (Signed)
Changed back to Bentyl

## 2019-08-18 NOTE — Telephone Encounter (Signed)
Patient states that he went to the pharmacy to get the medication that you sent in for him and it is $500.  He states that you told him that you were sending in something cheaper but it is still too much.  He states the first one that you sent in was $700 and the second one was $500.  He states that this is just too much.  He seems to think the pharmacy is not getting the correct prescription.

## 2019-08-19 DIAGNOSIS — D0461 Carcinoma in situ of skin of right upper limb, including shoulder: Secondary | ICD-10-CM | POA: Diagnosis not present

## 2019-08-19 DIAGNOSIS — C44622 Squamous cell carcinoma of skin of right upper limb, including shoulder: Secondary | ICD-10-CM | POA: Diagnosis not present

## 2019-09-04 ENCOUNTER — Other Ambulatory Visit: Payer: Self-pay

## 2019-09-04 ENCOUNTER — Encounter: Payer: Self-pay | Admitting: Physician Assistant

## 2019-09-04 ENCOUNTER — Ambulatory Visit (INDEPENDENT_AMBULATORY_CARE_PROVIDER_SITE_OTHER): Payer: PPO | Admitting: Physician Assistant

## 2019-09-04 ENCOUNTER — Telehealth: Payer: Self-pay

## 2019-09-04 DIAGNOSIS — U071 COVID-19: Secondary | ICD-10-CM | POA: Diagnosis not present

## 2019-09-04 DIAGNOSIS — K580 Irritable bowel syndrome with diarrhea: Secondary | ICD-10-CM | POA: Diagnosis not present

## 2019-09-04 NOTE — Telephone Encounter (Signed)
FYI...    Pt scheduled for an telephone visit at 1:20 today (09/04/2019)   Thanks,   -Mickel Baas

## 2019-09-04 NOTE — Progress Notes (Signed)
Patient: Ivan Kennedy Male    DOB: Dec 25, 1945   74 y.o.   MRN: [id number] Visit Date: 09/04/2019  Today's Provider: Mar Daring, PA-C   No chief complaint on file.  Subjective:    Virtual Visit via Telephone Note  I connected with Soyla Dryer on 09/04/19 at  1:20 PM EST by telephone and verified that I am speaking with the correct person using two identifiers.  Location: Patient: Home Provider: Home office in [place]   I discussed the limitations, risks, security and privacy concerns of performing an evaluation and management service by telephone and the availability of in person appointments. I also discussed with the patient that there may be a patient responsible charge related to this service. The patient expressed understanding and agreed to proceed.   HPI  Ivan Kennedy States is a 74 yr old male with long standing history of IBS-D. He had been stable on Lomotil for a long time until about a month ago. He noticed that it was not controlling his BM as well as it once had. Over the last month we have attempted to get other medications covered including: Alosetron (Lotronex), Dicyclomine (Bentyl) 71m, Rifaximin (Xifaxan). Viberzi was not tried because it was not on formulary. All above medications have been too expensive except Bentyl, which he is currently on. He reports he continues to have anywhere from 2-5 BM daily. He reports no abdominal symptoms prior to onset of needing to have a BM. It just hits him and he has to go. It is always very watery. No melena or hematochezia. Had diverticulosis and polyps on previous colonoscopy in 2016. Due for repeat colonoscopy now also.   No Known Allergies   Current Outpatient Medications:  .  aspirin EC 81 MG tablet, Take 81 mg by mouth daily. , Disp: , Rfl:  .  Blood Glucose Monitoring Suppl (ONE TOUCH ULTRA 2) w/Device KIT, To check blood sugar once daily, Disp: 1 each, Rfl: 0 .  dicyclomine (BENTYL) 20 MG tablet, Take 1  tablet (20 mg total) by mouth 4 (four) times daily -  before meals and at bedtime., Disp: 360 tablet, Rfl: 1 .  doxycycline (ADOXA) 50 MG tablet, Take 1 tablet (50 mg total) by mouth daily., Disp: 90 tablet, Rfl: 1 .  glipiZIDE (GLUCOTROL) 10 MG tablet, Take 1 tablet (10 mg total) by mouth 2 (two) times daily before a meal., Disp: 180 tablet, Rfl: 1 .  glucose blood test strip, TO CHECK BLOOD SUGAR ONCE DAILY, Disp: 100 each, Rfl: 3 .  hydrochlorothiazide (HYDRODIURIL) 25 MG tablet, Take 1 tablet (25 mg total) by mouth daily., Disp: 90 tablet, Rfl: 1 .  Lancets (ACCU-CHEK SOFT TOUCH) lancets, To check blood glucose daily, Disp: 100 each, Rfl: 12 .  loratadine-pseudoephedrine (CLARITIN-D 24-HOUR) 10-240 MG per 24 hr tablet, Take 1 tablet by mouth daily as needed for allergies., Disp: , Rfl:  .  losartan (COZAAR) 100 MG tablet, TAKE 1 TABLET (100 MG TOTAL) BY MOUTH DAILY., Disp: 90 tablet, Rfl: 1 .  metFORMIN (GLUCOPHAGE) 1000 MG tablet, TAKE 1 TABLET BY MOUTH TWICE A DAY, Disp: 180 tablet, Rfl: 1 .  metoprolol succinate (TOPROL-XL) 25 MG 24 hr tablet, 1 TABLET BY MOUTH EVERY DAY, Disp: 90 tablet, Rfl: 1 .  metroNIDAZOLE (METROGEL) 1 % gel, APPLY TO ROSACEA ONCE DAILY., Disp: , Rfl: 5 .  omeprazole (PRILOSEC) 40 MG capsule, TAKE 1 CAPSULE BY MOUTH EVERY DAY, Disp: 90 capsule, Rfl:  0 .  simvastatin (ZOCOR) 20 MG tablet, TAKE 1 TABLET BY MOUTH EVERY DAY IN THE EVENING, Disp: 90 tablet, Rfl: 3 .  tretinoin (RETIN-A) 0.05 % cream, Apply 1 application topically at bedtime. , Disp: , Rfl: 5 .  vitamin B-12 (CYANOCOBALAMIN) 1000 MCG tablet, Take 1,000 mcg by mouth daily., Disp: , Rfl:   Review of Systems  Constitutional: Negative.   Respiratory: Negative.   Cardiovascular: Negative.   Gastrointestinal: Positive for diarrhea. Negative for abdominal distention, abdominal pain, blood in stool, nausea, rectal pain and vomiting.  Genitourinary: Negative.   Musculoskeletal: Negative for back pain.     Social History   Tobacco Use  . Smoking status: Never Smoker  . Smokeless tobacco: Never Used  Substance Use Topics  . Alcohol use: Yes    Alcohol/week: 0.0 standard drinks    Comment: occasionally- beer 1-2/month      Objective:   There were no vitals taken for this visit. There were no vitals filed for this visit.There is no height or weight on file to calculate BMI.   Physical Exam Vitals reviewed.  Constitutional:      General: He is not in acute distress. Pulmonary:     Effort: No respiratory distress.  Neurological:     Mental Status: He is alert.      No results found for any visits on 09/04/19.     Assessment & Plan    1. Irritable bowel syndrome with diarrhea Not improving. Currently on Bentyl. Had previously been stable on Lomotil but it started not working as well. Have tried to get other medications covered but insurance copay has been too high for patient. Since there has been an acute worsening in previously stable symptoms, will refer to GI to r/o other causes. Also due for colonoscopy due to multiple polyps.  - Ambulatory referral to Gastroenterology    I discussed the assessment and treatment plan with the patient. The patient was provided an opportunity to ask questions and all were answered. The patient agreed with the plan and demonstrated an understanding of the instructions.   The patient was advised to call back or seek an in-person evaluation if the symptoms worsen or if the condition fails to improve as anticipated.  I provided 12 minutes of non-face-to-face time during this encounter.    Mar Daring, PA-C  [facility]

## 2019-09-04 NOTE — Telephone Encounter (Signed)
Copied from [place]. Topic: General - Other >> Sep 04, 2019  9:56 AM Ivan Kennedy wrote: Reason for CRM: Pt requests call back from [name] to discuss problems with his stomach. Pt declined to provide more information and requested that his call be returned

## 2019-09-05 DIAGNOSIS — G4733 Obstructive sleep apnea (adult) (pediatric): Secondary | ICD-10-CM | POA: Diagnosis not present

## 2019-09-28 ENCOUNTER — Ambulatory Visit: Payer: Self-pay

## 2019-09-29 ENCOUNTER — Ambulatory Visit: Payer: PPO | Attending: Internal Medicine

## 2019-09-29 DIAGNOSIS — Z23 Encounter for immunization: Secondary | ICD-10-CM | POA: Insufficient documentation

## 2019-09-29 NOTE — Progress Notes (Signed)
   Covid-19 Vaccination Clinic  Name:  Ivan Kennedy    MRN: [id number] DOB: 07-Nov-1945  09/29/2019  Mr. Ivan Kennedy was observed post Covid-19 immunization for 15 minutes without incidence. He was provided with Vaccine Information Sheet and instruction to access the V-Safe system.   Mr. Ivan Kennedy was instructed to call 911 with any severe reactions post vaccine: Marland Kitchen Difficulty breathing  . Swelling of your face and throat  . A fast heartbeat  . A bad rash all over your body  . Dizziness and weakness    Immunizations Administered    Name Date Dose VIS Date Route   Pfizer COVID-19 Vaccine 09/29/2019 11:22 AM 0.3 mL 07/25/2019 Intramuscular   Manufacturer: [place]   Lot: [id number]   [place]: [id number]

## 2019-09-30 DIAGNOSIS — T50Z95A Adverse effect of other vaccines and biological substances, initial encounter: Secondary | ICD-10-CM | POA: Insufficient documentation

## 2019-09-30 DIAGNOSIS — U071 COVID-19: Secondary | ICD-10-CM | POA: Diagnosis not present

## 2019-09-30 DIAGNOSIS — R509 Fever, unspecified: Secondary | ICD-10-CM | POA: Diagnosis not present

## 2019-09-30 DIAGNOSIS — R6883 Chills (without fever): Secondary | ICD-10-CM | POA: Diagnosis not present

## 2019-10-06 ENCOUNTER — Other Ambulatory Visit: Payer: Self-pay | Admitting: Physician Assistant

## 2019-10-06 DIAGNOSIS — E7800 Pure hypercholesterolemia, unspecified: Secondary | ICD-10-CM

## 2019-10-07 ENCOUNTER — Ambulatory Visit (INDEPENDENT_AMBULATORY_CARE_PROVIDER_SITE_OTHER): Payer: PPO | Admitting: Gastroenterology

## 2019-10-07 ENCOUNTER — Other Ambulatory Visit: Payer: Self-pay

## 2019-10-07 ENCOUNTER — Encounter: Payer: Self-pay | Admitting: Gastroenterology

## 2019-10-07 VITALS — BP 133/79 | HR 68 | Temp 97.6°F | Ht 73.0 in | Wt 230.8 lb

## 2019-10-07 DIAGNOSIS — R197 Diarrhea, unspecified: Secondary | ICD-10-CM

## 2019-10-07 DIAGNOSIS — Z86010 Personal history of colonic polyps: Secondary | ICD-10-CM

## 2019-10-07 NOTE — Progress Notes (Signed)
Ivan Bellows MD, MRCP(U.K) [address]  [place]  [place], [place] [address]  Main: [phone]  Fax: [phone]   Gastroenterology Consultation  Referring Provider:     Florian Buff* Primary Care Physician:  Mar Daring, PA-C Primary Gastroenterologist:  Dr. Jonathon Kennedy  Reason for Consultation:   IBS-D        HPI:   Ivan Kennedy is a 75 y.o. y/o male referred for consultation & management  by Dr. Marlyn Corporal, Clearnce Sorrel, PA-C.    The patient has been on Metformin.  HbA1c 7.7.  Last colonoscopy was back in 2016.  He has a personal history of colon polyps.  Found to have diverticulosis of the colon and internal hemorrhoids.  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  Only one tissue fragment is noted.  No endoscopic appearance of colitis was mentioned in the procedure note by Dr. Jamal Collin.  His wife is present in the room as he does not recollect fax after.  Of time.  He said that he has had diarrhea off for over 6 months explosive, multiple episodes a day, nonbloody, no abdominal pain.  4 pounds weight loss.  He has been on Metformin for many years.  No change in dose recently.  He takes 4 ibuprofens almost every day at night to sleep and from aches and pains related to playing golf.  He also uses about 3-4 flashes of sweet and low daily in his drinks.  Denies any any other artificial sweeteners or sugars.  He recalls that he has a prior history of colon polyps.     Past Medical History:  Diagnosis Date  . Colon polyp 2005  . Diabetes mellitus ([place])   . Hyperlipidemia   . Hypertension   . Rosacea   . Sleep apnea     Past Surgical History:  Procedure Laterality Date  . ANAL FISSURE REPAIR  1998  . COLONOSCOPY  2005, 2010  . 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 Earnestine Leys  . shoulder spurs Right 2000    Prior to Admission medications   Medication Sig  Start Date End Date Taking? Authorizing Provider  aspirin EC 81 MG tablet Take 81 mg by mouth daily.     [provider]  Blood Glucose Monitoring Suppl (ONE TOUCH ULTRA 2) w/Device KIT To check blood sugar once daily 08/08/17   [name] M, PA-C  dicyclomine (BENTYL) 20 MG tablet Take 1 tablet (20 mg total) by mouth 4 (four) times daily -  before meals and at bedtime. 08/18/19   Mar Daring, PA-C  doxycycline (ADOXA) 50 MG tablet Take 1 tablet (50 mg total) by mouth daily. 04/24/18   Mar Daring, PA-C  glipiZIDE (GLUCOTROL) 10 MG tablet Take 1 tablet (10 mg total) by mouth 2 (two) times daily before a meal. 05/08/19   [place], Clearnce Sorrel, PA-C  glucose blood test strip TO CHECK BLOOD SUGAR ONCE DAILY 09/09/18   Mar Daring, PA-C  hydrochlorothiazide (HYDRODIURIL) 25 MG tablet Take 1 tablet (25 mg total) by mouth daily. 05/08/19   Mar Daring, PA-C  Lancets (ACCU-CHEK SOFT TOUCH) lancets To check blood glucose daily 08/22/17   Mar Daring, PA-C  loratadine-pseudoephedrine (CLARITIN-D 24-HOUR) 10-240 MG per 24 hr tablet Take 1 tablet by mouth daily as needed for allergies.    [provider]  losartan (COZAAR) 100  MG tablet TAKE 1 TABLET (100 MG TOTAL) BY MOUTH DAILY. 06/19/19   Mar Daring, PA-C  metFORMIN (GLUCOPHAGE) 1000 MG tablet TAKE 1 TABLET BY MOUTH TWICE A DAY 05/26/19   [name] M, PA-C  metoprolol succinate (TOPROL-XL) 25 MG 24 hr tablet 1 TABLET BY MOUTH EVERY DAY 06/19/19   Mar Daring, PA-C  metroNIDAZOLE (METROGEL) 1 % gel APPLY TO ROSACEA ONCE DAILY. 03/30/15   [provider]  omeprazole (PRILOSEC) 40 MG capsule TAKE 1 CAPSULE BY MOUTH EVERY DAY 06/30/19   Mar Daring, PA-C  simvastatin (ZOCOR) 20 MG tablet TAKE 1 TABLET BY MOUTH EVERY DAY IN THE EVENING 10/06/19   Mar Daring, PA-C  tretinoin (RETIN-A) 0.05 % cream Apply 1 application topically at bedtime.  08/30/17    [provider]  vitamin B-12 (CYANOCOBALAMIN) 1000 MCG tablet Take 1,000 mcg by mouth daily.    [provider]    Family History  Problem Relation Age of Onset  . Diabetes Mother      Social History   Tobacco Use  . Smoking status: Never Smoker  . Smokeless tobacco: Never Used  Substance Use Topics  . Alcohol use: Yes    Alcohol/week: 0.0 standard drinks    Comment: occasionally- beer 1-2/month  . Drug use: No    Allergies as of 10/07/2019  . (No Known Allergies)    Review of Systems:    All systems reviewed and negative except where noted in HPI.   Physical Exam:  There were no vitals taken for this visit. No LMP for male patient. Psych:  Alert and cooperative. Normal mood and affect. General:   Alert,  Well-developed, well-nourished, pleasant and cooperative in NAD Head:  Normocephalic and atraumatic. Eyes:  Sclera clear, no icterus.   Conjunctiva pink. Ears:  Normal auditory acuity. Lungs:  Respirations even and unlabored.  Clear throughout to auscultation.   No wheezes, crackles, or rhonchi. No acute distress. Heart:  Regular rate and rhythm; no murmurs, clicks, rubs, or gallops. Abdomen:  Normal bowel sounds.  No bruits.  Soft, non-tender and non-distended without masses, hepatosplenomegaly or hernias noted.  No guarding or rebound tenderness.    Neurologic:  Alert and oriented x3;  grossly normal neurologically. Psych:  Alert and cooperative. Normal mood and affect.  Imaging Studies: No results found.  Assessment and Plan:   Ivan Kennedy is a 74 y.o. 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  Last colonoscopy in 2016 by Dr. Jamal Collin.  No mention of colitis was noted but there was a single biopsy bite that was taken of the lesion which showed features of chronic active colitis.  No subsequent follow-up for the same.  Very likely due to chronic NSAID use in the past.  Presently he has explosive diarrhea ongoing for 6 months.  Long-term use of NSAIDs  and artificial sugars may cause diarrhea in addition he is on Metformin which may also cause the same.   Plan 1.  Check stool for infection and fecal calprotectin. 2.  Stop all artificial sugars including ferritin low which she takes up to 4 sachets a day.  3.  Stop all use of ibuprofen which he takes 4 tablets daily at night for long-term 4.  As needed Imodium 5.  If no better in a week we will discuss with Mar Daring, PA-C to consider trial of holding Metformin.  6.  If that does not work will will proceed with colonoscopy.  Rule out  microscopic colitis.  Other options down the road include trial of Xifaxan for IBS-D and Creon for pancreatic insufficiency.  He will eventually need a colonoscopy due to prior history of colon polyps once the diarrhea has improved.   Follow up in 1 week telephone visit  Dr Ivan Bellows MD,MRCP(U.K)

## 2019-10-08 DIAGNOSIS — R197 Diarrhea, unspecified: Secondary | ICD-10-CM | POA: Diagnosis not present

## 2019-10-09 ENCOUNTER — Encounter: Payer: Self-pay | Admitting: Gastroenterology

## 2019-10-09 LAB — CELIAC DISEASE PANEL
Endomysial IgA: NEGATIVE
IgA/Immunoglobulin A, Serum: 163 mg/dL (ref 61–437)
Transglutaminase IgA: <2 U/mL (ref 0–3)

## 2019-10-14 LAB — GI PROFILE, STOOL, PCR

## 2019-10-14 LAB — CLOSTRIDIUM DIFFICILE BY PCR: Toxigenic C. Difficile by PCR: NEGATIVE

## 2019-10-14 LAB — CALPROTECTIN, FECAL: Calprotectin, Fecal: 67 ug/g (ref 0–120)

## 2019-10-16 ENCOUNTER — Ambulatory Visit (INDEPENDENT_AMBULATORY_CARE_PROVIDER_SITE_OTHER): Payer: PPO | Admitting: Gastroenterology

## 2019-10-16 DIAGNOSIS — K580 Irritable bowel syndrome with diarrhea: Secondary | ICD-10-CM | POA: Diagnosis not present

## 2019-10-16 DIAGNOSIS — R197 Diarrhea, unspecified: Secondary | ICD-10-CM

## 2019-10-16 NOTE — Progress Notes (Signed)
Ivan Kennedy , MD [address]  [place]  [place], [place] [address]  Main: [phone]  Fax: [phone]   Primary Care Physician: Mar Daring, PA-C  Virtual Visit via Telephone Note  I connected with patient on 10/16/19 at  3:00 PM EST by telephone and verified that I am speaking with the correct person using two identifiers.   I discussed the limitations, risks, security and privacy concerns of performing an evaluation and management service by telephone and the availability of in person appointments. I also discussed with the patient that there may be a patient responsible charge related to this service. The patient expressed understanding and agreed to proceed.  Location of Patient: Home Location of Provider: Home Persons involved: Patient and provider only   History of Present Illness:  Diarrhea follow-up   HPI: Ivan Kennedy is a 74 y.o. male    Summary of history :  Initially referred and seen on 10/07/2019 for diarrhea.The patient has been on Metformin.  HbA1c 7.7.  Last colonoscopy was back in 2016.  He has a personal history of colon polyps.  Found to have diverticulosis of the colon and internal hemorrhoids.  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  Only one tissue fragment is noted.  No endoscopic appearance of colitis was mentioned in the procedure note by Dr. Jamal Collin.Diarrhea on and off for over 6 months explosive, multiple episodes a day, nonbloody, no abdominal pain.  4 pounds weight loss.  He has been on Metformin for many years.  No change in dose recently.  He takes 4 ibuprofens almost every day at night to sleep and from aches and pains related to playing golf.  He also uses about 3-4 flashes of sweet and low daily in his drinks.  Denies any any other artificial sweeteners or sugars.  He recalls that he has a prior history of colon polyps.  Interval history  10/07/2019-10/16/2019  Since last visit he has stopped all artificial sugars, ibuprofens and diet soda.  He states he has improved over 50%.  2-3 bowel movements a day.  Lowering quantity and most form.  No other symptoms presently.  Has been taking dicyclomine 4 times a day.  Stool tests were negative for inflammation as well as for infection.  Current Outpatient Medications  Medication Sig Dispense Refill  . aspirin EC 81 MG tablet Take 81 mg by mouth daily.     . Blood Glucose Monitoring Suppl (ONE TOUCH ULTRA 2) w/Device KIT To check blood sugar once daily 1 each 0  . dicyclomine (BENTYL) 20 MG tablet Take 1 tablet (20 mg total) by mouth 4 (four) times daily -  before meals and at bedtime. 360 tablet 1  . doxycycline (ADOXA) 50 MG tablet Take 1 tablet (50 mg total) by mouth daily. 90 tablet 1  . doxycycline (VIBRAMYCIN) 50 MG capsule PLEASE SEE ATTACHED FOR DETAILED DIRECTIONS    . glipiZIDE (GLUCOTROL) 10 MG tablet Take 1 tablet (10 mg total) by mouth 2 (two) times daily before a meal. 180 tablet 1  . glucose blood test strip TO CHECK BLOOD SUGAR ONCE DAILY 100 each 3  . hydrochlorothiazide (HYDRODIURIL) 25 MG tablet Take 1 tablet (25 mg total) by mouth daily. 90 tablet 1  . Lancets (ACCU-CHEK SOFT TOUCH) lancets To check blood glucose daily 100 each 12  . loratadine-pseudoephedrine (CLARITIN-D 24-HOUR) 10-240 MG per 24 hr tablet  Take 1 tablet by mouth daily as needed for allergies.    Marland Kitchen losartan (COZAAR) 100 MG tablet TAKE 1 TABLET (100 MG TOTAL) BY MOUTH DAILY. 90 tablet 1  . metFORMIN (GLUCOPHAGE) 1000 MG tablet TAKE 1 TABLET BY MOUTH TWICE A DAY 180 tablet 1  . metoprolol succinate (TOPROL-XL) 25 MG 24 hr tablet 1 TABLET BY MOUTH EVERY DAY 90 tablet 1  . metroNIDAZOLE (METROGEL) 1 % gel APPLY TO ROSACEA ONCE DAILY.  5  . omeprazole (PRILOSEC) 40 MG capsule TAKE 1 CAPSULE BY MOUTH EVERY DAY 90 capsule 0  . simvastatin (ZOCOR) 20 MG tablet TAKE 1 TABLET BY MOUTH EVERY DAY IN THE EVENING 90  tablet 1  . tretinoin (RETIN-A) 0.05 % cream Apply 1 application topically at bedtime.   5  . vitamin B-12 (CYANOCOBALAMIN) 1000 MCG tablet Take 1,000 mcg by mouth daily.     No current facility-administered medications for this visit.    Allergies as of 10/16/2019  . (No Known Allergies)    Review of Systems:    All systems reviewed and negative except where noted in HPI.   Observations/Objective:  Labs: CMP     Component Value Date/Time   NA 142 10/30/2018 1017   K 3.4 (L) 10/30/2018 1017   CL 101 10/30/2018 1017   CO2 24 10/30/2018 1017   GLUCOSE 146 (H) 10/30/2018 1017   GLUCOSE 142 (H) 04/26/2017 0902   BUN 22 10/30/2018 1017   CREATININE 1.03 10/30/2018 1017   CREATININE 1.00 04/26/2017 0902   CALCIUM 9.3 10/30/2018 1017   PROT 5.9 (L) 10/30/2018 1017   ALBUMIN 4.3 10/30/2018 1017   AST 26 10/30/2018 1017   ALT 33 10/30/2018 1017   ALKPHOS 57 10/30/2018 1017   BILITOT 0.9 10/30/2018 1017   GFRNONAA 73 10/30/2018 1017   GFRNONAA 76 04/26/2017 0902   GFRAA 85 10/30/2018 1017   GFRAA 89 04/26/2017 0902   Lab Results  Component Value Date   WBC 6.1 10/30/2018   HGB 13.8 10/30/2018   HCT 39.7 10/30/2018   MCV 86 10/30/2018   PLT 150 10/30/2018    Imaging Studies: No results found.  Assessment and Plan:   Ivan Kennedy is a 74 y.o. y/o male here to follow-up for IBS-D.  Last colonoscopy in 2016 by Dr. Jamal Collin.  No mention of colitis was noted but there was a single biopsy bite that was taken of the lesion which showed features of chronic active colitis.  No subsequent follow-up for the same.  Very likely due to chronic NSAID use in the past.  Presently he has explosive diarrhea ongoing for 6 months.  Long-term use of NSAIDs and artificial sugars may cause diarrhea in addition he is on Metformin which may also cause the same.  Significantly improved after cutting down all artificial sugars and NSAIDs since last visit.  It may be too soon since his last office  visit to look for complete resolution so I will give him 4-6 more weeks to see how he is doing.   Plan 1.    Change Imodium to as needed.  Change dicyclomine to as needed.  Continue to hold off all on artificial sugars and sweeteners 2.    If no better in 4 weeks we will discuss with Mar Daring, PA-C to consider trial of holding Metformin.  3.  If that does not work will will proceed with colonoscopy.  Rule out microscopic colitis.  Other options down the road include trial  of Xifaxan for IBS-D and Creon for pancreatic insufficiency.  He will eventually need a colonoscopy due to prior history of colon polyps once the diarrhea has improved.  Follow-up in 4 to 6 weeks telephone visit   I discussed the assessment and treatment plan with the patient. The patient was provided an opportunity to ask questions and all were answered. The patient agreed with the plan and demonstrated an understanding of the instructions.   The patient was advised to call back or seek an in-person evaluation if the symptoms worsen or if the condition fails to improve as anticipated.  I provided 12 minutes of non-face-to-face time during this encounter.  Dr Ivan Bellows MD,MRCP [facility]) Gastroenterology/Hepatology Pager: [phone]   Speech recognition software was used to dictate this note.

## 2019-10-22 NOTE — Progress Notes (Signed)
Subjective:   Ivan Kennedy is a 74 y.o. male who presents for Medicare Annual/Subsequent preventive examination.    This visit is being conducted through telemedicine due to the COVID-19 pandemic. This patient has given me verbal consent via doximity to conduct this visit, patient states they are participating from their home address. Some vital signs may be absent or patient reported.    Patient identification: identified by name, DOB, and current address  Review of Systems:  N/A  Cardiac Risk Factors include: advanced age (>20mn, >>32women);diabetes mellitus;dyslipidemia;hypertension;male gender     Objective:    Vitals: There were no vitals taken for this visit.  There is no height or weight on file to calculate BMI. Unable to obtain vitals due to visit being conducted via telephonically.   Advanced Directives 10/23/2019 10/17/2018 09/27/2017 10/11/2016 07/19/2015 01/21/2015  Does Patient Have a Medical Advance Directive? Yes Yes Yes Yes No No  Type of AParamed[place]of [place] will Living will;Healthcare Power of [place] Chart? No - copy requested No - copy requested No - copy requested No - copy requested - -  Would patient like information on creating a medical advance directive? - - - - - No - patient declined information    Tobacco Social History   Tobacco Use  Smoking Status Never Smoker  Smokeless Tobacco Never Used     Counseling given: Not Answered   Clinical Intake:  Pre-visit preparation completed: Yes  Pain : No/denies pain Pain Score: 0-No pain     Nutritional Risks: None Diabetes: Yes  How often do you need to have someone help you when you read instructions, pamphlets, or other written materials from your doctor or pharmacy?: 1 - Never   Diabetes:  Is the patient diabetic?  Yes  If diabetic, was a CBG  obtained today?  No  Did the patient bring in their glucometer from home?  No  How often do you monitor your CBG's? Once a day.   Financial Strains and Diabetes Management:  Are you having any financial strains with the device, your supplies or your medication? No .  Does the patient want to be seen by Chronic Care Management for management of their diabetes?  No  Would the patient like to be referred to a Nutritionist or for Diabetic Management?  No   Diabetic Exams:  Diabetic Eye Exam: Completed 02/21/18. Overdue for diabetic eye exam. Pt has been advised about the importance in completing this exam. Pt plans to have this scheduled this year.  Diabetic Foot Exam: Completed 10/30/18. Repeat yearly.  Interpreter Needed?: No  [facility] :: [facility]  Past Medical History:  Diagnosis Date  . Colon polyp 2005  . Diabetes mellitus ([place]   . Hyperlipidemia   . Hypertension   . Rosacea   . Sleep apnea    Past Surgical History:  Procedure Laterality Date  . ANAL FISSURE REPAIR  1998  . COLONOSCOPY  2005, 2010  . 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 HEarnestine Leys . shoulder spurs Right 2000   Family History  Problem Relation Age of Onset  . Diabetes Mother    Social History   Socioeconomic History  . Marital status: Married    Spouse name: Not on file  . Number of children: 1  . Years of education: Not on file  .  Highest education level: GED or equivalent  Occupational History  . Occupation: retired  Tobacco Use  . Smoking status: Never Smoker  . Smokeless tobacco: Never Used  Substance and Sexual Activity  . Alcohol use: Yes    Alcohol/week: 0.0 standard drinks    Comment: occasionally- beer 1-2/month  . Drug use: No  . Sexual activity: Not on file  Other Topics Concern  . Not on file  Social History Narrative  . Not on file   Social Determinants of Health   Financial Resource Strain: Low Risk   . Difficulty of Paying Living Expenses:  Not hard at all  Food Insecurity: No Food Insecurity  . Worried About Charity fundraiser in the Last Year: Never true  . Ran Out of Food in the Last Year: Never true  Transportation Needs: No Transportation Needs  . Lack of Transportation (Medical): No  . Lack of Transportation (Non-Medical): No  Physical Activity: Inactive  . Days of Exercise per Week: 0 days  . Minutes of Exercise per Session: 0 min  Stress: No Stress Concern Present  . Feeling of Stress : Not at all  Social Connections: Not Isolated  . Frequency of Communication with Friends and Family: More than three times a week  . Frequency of Social Gatherings with Friends and Family: More than three times a week  . Attends Religious Services: More than 4 times per year  . Active Member of Clubs or Organizations: Yes  . Attends Archivist Meetings: More than 4 times per year  . Marital Status: Married    Outpatient Encounter Medications as of 10/23/2019  Medication Sig  . aspirin EC 81 MG tablet Take 81 mg by mouth daily.   . Blood Glucose Monitoring Suppl (ONE TOUCH ULTRA 2) w/Device KIT To check blood sugar once daily  . doxycycline (ADOXA) 50 MG tablet Take 1 tablet (50 mg total) by mouth daily.  Marland Kitchen doxycycline (VIBRAMYCIN) 50 MG capsule PLEASE SEE ATTACHED FOR DETAILED DIRECTIONS  . glipiZIDE (GLUCOTROL) 10 MG tablet Take 1 tablet (10 mg total) by mouth 2 (two) times daily before a meal.  . glucose blood test strip TO CHECK BLOOD SUGAR ONCE DAILY  . hydrochlorothiazide (HYDRODIURIL) 25 MG tablet Take 1 tablet (25 mg total) by mouth daily.  . Lancets (ACCU-CHEK SOFT TOUCH) lancets To check blood glucose daily  . loratadine-pseudoephedrine (CLARITIN-D 24-HOUR) 10-240 MG per 24 hr tablet Take 1 tablet by mouth daily as needed for allergies.  Marland Kitchen losartan (COZAAR) 100 MG tablet TAKE 1 TABLET (100 MG TOTAL) BY MOUTH DAILY.  . metFORMIN (GLUCOPHAGE) 1000 MG tablet TAKE 1 TABLET BY MOUTH TWICE A DAY  . metoprolol  succinate (TOPROL-XL) 25 MG 24 hr tablet 1 TABLET BY MOUTH EVERY DAY  . metroNIDAZOLE (METROGEL) 1 % gel APPLY TO ROSACEA ONCE DAILY.  Marland Kitchen omeprazole (PRILOSEC) 40 MG capsule TAKE 1 CAPSULE BY MOUTH EVERY DAY  . simvastatin (ZOCOR) 20 MG tablet TAKE 1 TABLET BY MOUTH EVERY DAY IN THE EVENING  . tretinoin (RETIN-A) 0.05 % cream Apply 1 application topically at bedtime.   . vitamin B-12 (CYANOCOBALAMIN) 1000 MCG tablet Take 1,000 mcg by mouth daily.  Marland Kitchen dicyclomine (BENTYL) 20 MG tablet Take 1 tablet (20 mg total) by mouth 4 (four) times daily -  before meals and at bedtime. (Patient not taking: Reported on 10/23/2019)   No facility-administered encounter medications on file as of 10/23/2019.    Activities of Daily Living In your present state  of health, do you have any difficulty performing the following activities: 10/23/2019  Hearing? N  Vision? N  Difficulty concentrating or making decisions? N  Walking or climbing stairs? N  Dressing or bathing? N  Doing errands, shopping? N  Preparing Food and eating ? N  Using the Toilet? N  In the past six months, have you accidently leaked urine? N  Do you have problems with loss of bowel control? Y  Comment Currently f/u with Dr Vicente Males for this issue.  Managing your Medications? N  Managing your Finances? N  Housekeeping or managing your Housekeeping? N  Some recent data might be hidden    Patient Care Team: Mar Daring, PA-C as PCP - General (Family Medicine) Arelia Sneddon, [place] as Consulting Physician (Optometry) Jannet Mantis, MD as Consulting Physician (Dermatology) Jonathon Bellows, MD as Consulting Physician (Gastroenterology)   Assessment:   This is a routine wellness examination for [place].  Exercise Activities and Dietary recommendations Current Exercise Habits: The patient does not participate in regular exercise at present, Exercise limited by: None identified  Goals    . DIET - INCREASE WATER INTAKE     Recommend  increasing water intake to 4-6 glasses a day.    Marland Kitchen DIET - REDUCE CARBOHYDRATES     Recommend to cut back on carbohydrates to 1 serving a day and to focus on eating more good carbohydrates.        Fall Risk Fall Risk  10/23/2019 05/08/2019 10/17/2018 09/27/2017 04/26/2017  Falls in the past year? 0 1 0 No No  Number falls in past yr: 0 1 - - -  Injury with Fall? 0 1 - - -  Comment - right ankle swelling ( 2 weeks ago) missed a step - - -   FALL RISK PREVENTION PERTAINING TO THE HOME:  Any stairs in or around the home? No  If so, are there any without handrails? N/A  Home free of loose throw rugs in walkways, pet beds, electrical cords, etc? Yes  Adequate lighting in your home to reduce risk of falls? Yes   ASSISTIVE DEVICES UTILIZED TO PREVENT FALLS:  Life alert? No  Use of a cane, walker or w/c? No  Grab bars in the bathroom? No  Shower chair or bench in shower? No  Elevated toilet seat or a handicapped toilet? No    TIMED UP AND GO:  Was the test performed? No .    Depression Screen PHQ 2/9 Scores 10/23/2019 10/17/2018 10/17/2018 09/27/2017  PHQ - 2 Score 0 0 0 0  PHQ- 9 Score - 1 - -    Cognitive Function: Declined today.      6CIT Screen 10/11/2016  What Year? 0 points  What month? 0 points  What time? 0 points  Count back from 20 0 points  Months in reverse 0 points  Repeat phrase 2 points  Total Score 2    Immunization History  Administered Date(s) Administered  . Fluad Quad(high Dose 65+) 05/08/2019  . Hepatitis B, adult 05/29/2012, 07/29/2012, 03/18/2013  . Influenza, High Dose Seasonal PF 07/06/2014, 05/06/2015, 07/11/2016, 04/26/2017, 04/24/2018  . PFIZER SARS-COV-2 Vaccination 09/29/2019  . Pneumococcal Conjugate-13 07/06/2014  . Pneumococcal Polysaccharide-23 05/29/2012, 10/30/2018    Qualifies for Shingles Vaccine? Yes . Due for Shingrix. Pt has been advised to call insurance company to determine out of pocket expense. Advised may also receive vaccine  at local pharmacy or Health Dept. Verbalized acceptance and understanding.  Tdap: Although this vaccine is  not a covered service during a Wellness Exam, does the patient still wish to receive this vaccine today?  No . Advised may receive this vaccine at local pharmacy or Health Dept. Aware to provide a copy of the vaccination record if obtained from local pharmacy or Health Dept. Verbalized acceptance and understanding.  Flu Vaccine: Up to date  Pneumococcal Vaccine: Completed series  Screening Tests Health Maintenance  Topic Date Due  . OPHTHALMOLOGY EXAM  02/22/2019  . TETANUS/TDAP  10/30/2019 (Originally 12/23/1964)  . COLONOSCOPY  10/29/2019  . FOOT EXAM  10/30/2019  . HEMOGLOBIN A1C  01/29/2020  . INFLUENZA VACCINE  Completed  . Hepatitis C Screening  Completed  . PNA vac Low Risk Adult  Completed   Cancer Screenings:  Colorectal Screening: Completed 10/29/14. Repeat every 5 years. Pt is currently seeing Dr Vicente Males for GI concerns.  Lung Cancer Screening: (Low Dose CT Chest recommended if Age 43-80 years, 30 pack-year currently smoking OR have quit w/in 15years.) does not qualify.   Additional Screening:  Hepatitis C Screening: Up to date  Dental Screening: Recommended annual dental exams for proper oral hygiene  Community Resource Referral:  CRR required this visit?  No        Plan:  I have personally reviewed and addressed the Medicare Annual Wellness questionnaire and have noted the following in the patient's chart:  A. Medical and social history B. Use of alcohol, tobacco or illicit drugs  C. Current medications and supplements D. Functional ability and status E.  Nutritional status F.  Physical activity G. Advance directives H. List of other physicians I.  Hospitalizations, surgeries, and ER visits in previous 12 months J.  [place] such as hearing and vision if needed, cognitive and depression L. Referrals and appointments   In addition, I have  reviewed and discussed with patient certain preventive protocols, quality metrics, and best practice recommendations. A written personalized care plan for preventive services as well as general preventive health recommendations were provided to patient.   [place]endora Score, [place]  [id number]/61/6073 Nurse Health Advisor  Nurse Notes: Pt plans to schedule an eye exam for this year.

## 2019-10-23 ENCOUNTER — Other Ambulatory Visit: Payer: Self-pay

## 2019-10-23 ENCOUNTER — Ambulatory Visit (INDEPENDENT_AMBULATORY_CARE_PROVIDER_SITE_OTHER): Payer: PPO

## 2019-10-23 DIAGNOSIS — Z0000 Encounter for general adult medical examination without abnormal findings: Secondary | ICD-10-CM

## 2019-10-23 NOTE — Patient Instructions (Signed)
Ivan Kennedy , Thank you for taking time to come for your Medicare Wellness Visit. I appreciate your ongoing commitment to your health goals. Please review the following plan we discussed and let me know if I can assist you in the future.   Screening recommendations/referrals: Colonoscopy: Due this month. Currently following up with Dr Vicente Males.  Recommended yearly ophthalmology/optometry visit for glaucoma screening and checkup Recommended yearly dental visit for hygiene and checkup  Vaccinations: Influenza vaccine: Up to date Pneumococcal vaccine: Completed series Tdap vaccine: Pt declines today.  Shingles vaccine: Pt declines today.     Advanced directives: Please bring a copy of your POA (Power of Attorney) and/or Living Will to your next appointment.   Conditions/risks identified: Continue to increase water intake to 6-8 8 oz glasses a day.  Next appointment: 11/06/19 @ 8:00 AM with [name]. Declined scheduling an AWV for 2022 at this time.  Preventive Care 35 Years and Older, Male Preventive care refers to lifestyle choices and visits with your health care provider that can promote health and wellness. What does preventive care include?  A yearly physical exam. This is also called an annual well check.  Dental exams once or twice a year.  Routine eye exams. Ask your health care provider how often you should have your eyes checked.  Personal lifestyle choices, including:  Daily care of your teeth and gums.  Regular physical activity.  Eating a healthy diet.  Avoiding tobacco and drug use.  Limiting alcohol use.  Practicing safe sex.  Taking low doses of aspirin every day.  Taking vitamin and mineral supplements as recommended by your health care provider. What happens during an annual well check? The services and screenings done by your health care provider during your annual well check will depend on your age, overall health, lifestyle risk factors, and family  history of disease. Counseling  Your health care provider may ask you questions about your:  Alcohol use.  Tobacco use.  Drug use.  Emotional well-being.  Home and relationship well-being.  Sexual activity.  Eating habits.  History of falls.  Memory and ability to understand (cognition).  Work and work Statistician. Screening  You may have the following tests or measurements:  Height, weight, and BMI.  Blood pressure.  Lipid and cholesterol levels. These may be checked every 5 years, or more frequently if you are over 38 years old.  Skin check.  Lung cancer screening. You may have this screening every year starting at age 24 if you have a 30-pack-year history of smoking and currently smoke or have quit within the past 15 years.  Fecal occult blood test (FOBT) of the stool. You may have this test every year starting at age 48.  Flexible sigmoidoscopy or colonoscopy. You may have a sigmoidoscopy every 5 years or a colonoscopy every 10 years starting at age 32.  Prostate cancer screening. Recommendations will vary depending on your family history and other risks.  Hepatitis C blood test.  Hepatitis B blood test.  Sexually transmitted disease (STD) testing.  Diabetes screening. This is done by checking your blood sugar (glucose) after you have not eaten for a while (fasting). You may have this done every 1-3 years.  Abdominal aortic aneurysm (AAA) screening. You may need this if you are a current or former smoker.  Osteoporosis. You may be screened starting at age 68 if you are at high risk. Talk with your health care provider about your test results, treatment options, and if necessary,  the need for more tests. Vaccines  Your health care provider may recommend certain vaccines, such as:  Influenza vaccine. This is recommended every year.  Tetanus, diphtheria, and acellular pertussis (Tdap, Td) vaccine. You may need a Td booster every 10 years.  Zoster vaccine.  You may need this after age 84.  Pneumococcal 13-valent conjugate (PCV13) vaccine. One dose is recommended after age 69.  Pneumococcal polysaccharide (PPSV23) vaccine. One dose is recommended after age 97. Talk to your health care provider about which screenings and vaccines you need and how often you need them. This information is not intended to replace advice given to you by your health care provider. Make sure you discuss any questions you have with your health care provider. Document Released: 08/27/2015 Document Revised: 04/19/2016 Document Reviewed: 06/01/2015 Elsevier Interactive Patient Education  2017 [place] Prevention in the Home Falls can cause injuries. They can happen to people of all ages. There are many things you can do to make your home safe and to help prevent falls. What can I do on the outside of my home?  Regularly fix the edges of walkways and driveways and fix any cracks.  Remove anything that might make you trip as you walk through a door, such as a raised step or threshold.  Trim any bushes or trees on the path to your home.  Use bright outdoor lighting.  Clear any walking paths of anything that might make someone trip, such as rocks or tools.  Regularly check to see if handrails are loose or broken. Make sure that both sides of any steps have handrails.  Any raised decks and porches should have guardrails on the edges.  Have any leaves, snow, or ice cleared regularly.  Use sand or salt on walking paths during winter.  Clean up any spills in your garage right away. This includes oil or grease spills. What can I do in the bathroom?  Use night lights.  Install grab bars by the toilet and in the tub and shower. Do not use towel bars as grab bars.  Use non-skid mats or decals in the tub or shower.  If you need to sit down in the shower, use a plastic, non-slip stool.  Keep the floor dry. Clean up any water that spills on the floor as soon  as it happens.  Remove soap buildup in the tub or shower regularly.  Attach bath mats securely with double-sided non-slip rug tape.  Do not have throw rugs and other things on the floor that can make you trip. What can I do in the bedroom?  Use night lights.  Make sure that you have a light by your bed that is easy to reach.  Do not use any sheets or blankets that are too big for your bed. They should not hang down onto the floor.  Have a firm chair that has side arms. You can use this for support while you get dressed.  Do not have throw rugs and other things on the floor that can make you trip. What can I do in the kitchen?  Clean up any spills right away.  Avoid walking on wet floors.  Keep items that you use a lot in easy-to-reach places.  If you need to reach something above you, use a strong step stool that has a grab bar.  Keep electrical cords out of the way.  Do not use floor polish or wax that makes floors slippery. If you must use  wax, use non-skid floor wax.  Do not have throw rugs and other things on the floor that can make you trip. What can I do with my stairs?  Do not leave any items on the stairs.  Make sure that there are handrails on both sides of the stairs and use them. Fix handrails that are broken or loose. Make sure that handrails are as long as the stairways.  Check any carpeting to make sure that it is firmly attached to the stairs. Fix any carpet that is loose or worn.  Avoid having throw rugs at the top or bottom of the stairs. If you do have throw rugs, attach them to the floor with carpet tape.  Make sure that you have a light switch at the top of the stairs and the bottom of the stairs. If you do not have them, ask someone to add them for you. What else can I do to help prevent falls?  Wear shoes that:  Do not have high heels.  Have rubber bottoms.  Are comfortable and fit you well.  Are closed at the toe. Do not wear sandals.  If  you use a stepladder:  Make sure that it is fully opened. Do not climb a closed stepladder.  Make sure that both sides of the stepladder are locked into place.  Ask someone to hold it for you, if possible.  Clearly mark and make sure that you can see:  Any grab bars or handrails.  First and last steps.  Where the edge of each step is.  Use tools that help you move around (mobility aids) if they are needed. These include:  Canes.  Walkers.  Scooters.  Crutches.  Turn on the lights when you go into a dark area. Replace any light bulbs as soon as they burn out.  Set up your furniture so you have a clear path. Avoid moving your furniture around.  If any of your floors are uneven, fix them.  If there are any pets around you, be aware of where they are.  Review your medicines with your doctor. Some medicines can make you feel dizzy. This can increase your chance of falling. Ask your doctor what other things that you can do to help prevent falls. This information is not intended to replace advice given to you by your health care provider. Make sure you discuss any questions you have with your health care provider. Document Released: 05/27/2009 Document Revised: 01/06/2016 Document Reviewed: 09/04/2014 Elsevier Interactive Patient Education  2017 [employer].

## 2019-10-28 ENCOUNTER — Ambulatory Visit: Payer: PPO | Attending: Internal Medicine

## 2019-10-28 DIAGNOSIS — Z23 Encounter for immunization: Secondary | ICD-10-CM

## 2019-10-28 NOTE — Progress Notes (Signed)
   Covid-19 Vaccination Clinic  Name:  Ivan Kennedy    MRN: [id number] DOB: November 26, 1945  10/28/2019  Mr. [name] was observed post Covid-19 immunization for 15 minutes without incident. He was provided with Vaccine Information Sheet and instruction to access the V-Safe system.   Mr. [name] was instructed to call 911 with any severe reactions post vaccine: Marland Kitchen Difficulty breathing  . Swelling of face and throat  . A fast heartbeat  . A bad rash all over body  . Dizziness and weakness   Immunizations Administered    Name Date Dose VIS Date Route   Pfizer COVID-19 Vaccine 10/28/2019 12:17 PM 0.3 mL 07/25/2019 Intramuscular   Manufacturer: [place]   Lot: [id number]   [place]: [id number]

## 2019-10-30 ENCOUNTER — Other Ambulatory Visit: Payer: Self-pay | Admitting: Physician Assistant

## 2019-10-30 DIAGNOSIS — E119 Type 2 diabetes mellitus without complications: Secondary | ICD-10-CM

## 2019-10-30 DIAGNOSIS — I10 Essential (primary) hypertension: Secondary | ICD-10-CM

## 2019-11-03 NOTE — Progress Notes (Signed)
Patient: Ivan Kennedy, Male    DOB: [birth year], 74 y.o.   MRN: [id number] Visit Date: 11/06/2019  Today's Provider: Mar Daring, PA-C   Chief Complaint  Patient presents with  . Annual Exam   Subjective:   AWV done 10/23/2019   Complete Physical Ivan Kennedy is a 74 y.o. male. He feels fairly well. He reports exercising some. He reports he is sleeping fairly well. -----------------------------------------------------------   Review of Systems  Constitutional: Negative.   HENT: Negative.   Eyes: Negative.   Respiratory: Negative.   Cardiovascular: Negative.   Gastrointestinal: Negative.   Endocrine: Negative.   Genitourinary: Negative.   Musculoskeletal: Negative.   Skin: Negative.   Allergic/Immunologic: Negative.   Neurological: Negative.   Hematological: Negative.   Psychiatric/Behavioral: Negative.     Social History   Socioeconomic History  . Marital status: Married    Spouse name: Not on file  . Number of children: 1  . Years of education: Not on file  . Highest education level: GED or equivalent  Occupational History  . Occupation: retired  Tobacco Use  . Smoking status: Never Smoker  . Smokeless tobacco: Never Used  Substance and Sexual Activity  . Alcohol use: Yes    Alcohol/week: 0.0 standard drinks    Comment: occasionally- beer 1-2/month  . Drug use: No  . Sexual activity: Not on file  Other Topics Concern  . Not on file  Social History Narrative  . Not on file   Social Determinants of Health   Financial Resource Strain: Low Risk   . Difficulty of Paying Living Expenses: Not hard at all  Food Insecurity: No Food Insecurity  . Worried About Charity fundraiser in the Last Year: Never true  . Ran Out of Food in the Last Year: Never true  Transportation Needs: No Transportation Needs  . Lack of Transportation (Medical): No  . Lack of Transportation (Non-Medical): No  Physical Activity: Inactive  . Days of Exercise per  Week: 0 days  . Minutes of Exercise per Session: 0 min  Stress: No Stress Concern Present  . Feeling of Stress : Not at all  Social Connections: Not Isolated  . Frequency of Communication with Friends and Family: More than three times a week  . Frequency of Social Gatherings with Friends and Family: More than three times a week  . Attends Religious Services: More than 4 times per year  . Active Member of Clubs or Organizations: Yes  . Attends Archivist Meetings: More than 4 times per year  . Marital Status: Married  Human resources officer Violence: Not At Risk  . Fear of Current or Ex-Partner: No  . Emotionally Abused: No  . Physically Abused: No  . Sexually Abused: No    Past Medical History:  Diagnosis Date  . Colon polyp 2005  . Diabetes mellitus ([place])   . Hyperlipidemia   . Hypertension   . Rosacea   . Sleep apnea      Patient Active Problem List   Diagnosis Date Noted  . Hallux valgus 07/31/2018  . IBS (irritable bowel syndrome) 01/21/2015  . B12 deficiency 01/07/2015  . Adult BMI 30+ 01/07/2015  . Hyperlipidemia 01/07/2015  . Cannot sleep 01/07/2015  . Fatty liver disease, nonalcoholic 53/74/8270  . Osteoarthritis of knee 01/07/2015  . Acne erythematosa 01/07/2015  . Disorder of male genital organs 01/07/2015  . Atypical chest pain 10/09/2014  . Acid reflux 09/06/2009  .  Diabetes mellitus, type 2 ([place]) 11/20/2008  . Colon, diverticulosis 11/20/2008  . BP (high blood pressure) 11/20/2008  . Apnea, sleep 11/20/2008  . History of artificial joint 11/20/2008    Past Surgical History:  Procedure Laterality Date  . ANAL FISSURE REPAIR  1998  . COLONOSCOPY  2005, 2010  . 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 Earnestine Leys  . shoulder spurs Right 2000    His family history includes Diabetes in his mother.   Current Outpatient Medications:  .  aspirin EC 81 MG tablet, Take 81 mg by mouth daily. , Disp: , Rfl:  .  Blood Glucose Monitoring Suppl  (ONE TOUCH ULTRA 2) w/Device KIT, To check blood sugar once daily, Disp: 1 each, Rfl: 0 .  doxycycline (ADOXA) 50 MG tablet, Take 1 tablet (50 mg total) by mouth daily., Disp: 90 tablet, Rfl: 1 .  doxycycline (VIBRAMYCIN) 50 MG capsule, PLEASE SEE ATTACHED FOR DETAILED DIRECTIONS, Disp: , Rfl:  .  glipiZIDE (GLUCOTROL) 10 MG tablet, TAKE 1 TABLET (10 MG TOTAL) BY MOUTH 2 (TWO) TIMES DAILY BEFORE A MEAL., Disp: 180 tablet, Rfl: 0 .  glucose blood test strip, TO CHECK BLOOD SUGAR ONCE DAILY, Disp: 100 each, Rfl: 3 .  hydrochlorothiazide (HYDRODIURIL) 25 MG tablet, TAKE 1 TABLET BY MOUTH EVERY DAY, Disp: 90 tablet, Rfl: 0 .  Lancets (ACCU-CHEK SOFT TOUCH) lancets, To check blood glucose daily, Disp: 100 each, Rfl: 12 .  loratadine-pseudoephedrine (CLARITIN-D 24-HOUR) 10-240 MG per 24 hr tablet, Take 1 tablet by mouth daily as needed for allergies., Disp: , Rfl:  .  losartan (COZAAR) 100 MG tablet, TAKE 1 TABLET (100 MG TOTAL) BY MOUTH DAILY., Disp: 90 tablet, Rfl: 1 .  metFORMIN (GLUCOPHAGE) 1000 MG tablet, TAKE 1 TABLET BY MOUTH TWICE A DAY, Disp: 180 tablet, Rfl: 1 .  metoprolol succinate (TOPROL-XL) 25 MG 24 hr tablet, 1 TABLET BY MOUTH EVERY DAY, Disp: 90 tablet, Rfl: 1 .  metroNIDAZOLE (METROGEL) 1 % gel, APPLY TO ROSACEA ONCE DAILY., Disp: , Rfl: 5 .  omeprazole (PRILOSEC) 40 MG capsule, TAKE 1 CAPSULE BY MOUTH EVERY DAY, Disp: 90 capsule, Rfl: 0 .  simvastatin (ZOCOR) 20 MG tablet, TAKE 1 TABLET BY MOUTH EVERY DAY IN THE EVENING, Disp: 90 tablet, Rfl: 1 .  tretinoin (RETIN-A) 0.05 % cream, Apply 1 application topically at bedtime. , Disp: , Rfl: 5 .  vitamin B-12 (CYANOCOBALAMIN) 1000 MCG tablet, Take 1,000 mcg by mouth daily., Disp: , Rfl:  .  dicyclomine (BENTYL) 20 MG tablet, Take 1 tablet (20 mg total) by mouth 4 (four) times daily -  before meals and at bedtime., Disp: 360 tablet, Rfl: 1  Patient Care Team: Mar Daring, PA-C as PCP - General (Family Medicine) Arelia Sneddon,  OD as Consulting Physician (Optometry) Jannet Mantis, MD as Consulting Physician (Dermatology) Jonathon Bellows, MD as Consulting Physician (Gastroenterology)     Objective:    Vitals: BP (!) 148/89 (BP Location: Left Arm, Patient Position: Sitting, Cuff Size: Normal)   Pulse 73   Temp (!) 96.8 F (36 C) (Temporal)   Resp 16   Ht '6\' 1"'$  (1.854 m)   Wt 225 lb 9.6 oz (102.3 kg)   BMI 29.76 kg/m   Physical Exam Vitals reviewed.  Constitutional:      General: He is not in acute distress.    Appearance: Normal appearance. He is well-developed and overweight. He is not ill-appearing.  HENT:     Head: Normocephalic and  atraumatic.     Right Ear: Tympanic membrane, ear canal and external ear normal.     Left Ear: Tympanic membrane, ear canal and external ear normal.  Eyes:     General: No scleral icterus.       Right eye: No discharge.        Left eye: No discharge.     Extraocular Movements: Extraocular movements intact.     Conjunctiva/sclera: Conjunctivae normal.     Pupils: Pupils are equal, round, and reactive to light.  Neck:     Thyroid: No thyromegaly.     Vascular: No carotid bruit.     Trachea: No tracheal deviation.  Cardiovascular:     Rate and Rhythm: Normal rate and regular rhythm.     Pulses: Normal pulses.     Heart sounds: Normal heart sounds. No murmur.  Pulmonary:     Effort: Pulmonary effort is normal. No respiratory distress.     Breath sounds: Normal breath sounds. No wheezing or rales.  Chest:     Chest wall: No tenderness.  Abdominal:     General: Abdomen is flat. Bowel sounds are normal. There is no distension.     Palpations: Abdomen is soft. There is no mass.     Tenderness: There is no abdominal tenderness. There is no guarding or rebound.  Musculoskeletal:        General: No tenderness. Normal range of motion.     Cervical back: Normal range of motion and neck supple.     Right lower leg: No edema.     Left lower leg: No edema.    Lymphadenopathy:     Cervical: No cervical adenopathy.  Skin:    General: Skin is warm and dry.     Capillary Refill: Capillary refill takes less than 2 seconds.     Findings: No erythema or rash.  Neurological:     General: No focal deficit present.     Mental Status: He is alert and oriented to person, place, and time. Mental status is at baseline.     Cranial Nerves: No cranial nerve deficit.     Motor: No abnormal muscle tone.     Coordination: Coordination normal.     Deep Tendon Reflexes: Reflexes are normal and symmetric. Reflexes normal.  Psychiatric:        Mood and Affect: Mood normal.        Behavior: Behavior normal. Behavior is cooperative.        Thought Content: Thought content normal.        Judgment: Judgment normal.     Activities of Daily Living In your present state of health, do you have any difficulty performing the following activities: 10/23/2019  Hearing? N  Vision? N  Difficulty concentrating or making decisions? N  Walking or climbing stairs? N  Dressing or bathing? N  Doing errands, shopping? N  Preparing Food and eating ? N  Using the Toilet? N  In the past six months, have you accidently leaked urine? N  Do you have problems with loss of bowel control? Y  Comment Currently f/u with Dr Vicente Males for this issue.  Managing your Medications? N  Managing your Finances? N  Housekeeping or managing your Housekeeping? N  Some recent data might be hidden    Fall Risk Assessment Fall Risk  10/23/2019 05/08/2019 10/17/2018 09/27/2017 04/26/2017  Falls in the past year? 0 1 0 No No  Number falls in past yr: 0 1 - - -  Injury with Fall? 0 1 - - -  Comment - right ankle swelling ( 2 weeks ago) missed a step - - -     Depression Screen PHQ 2/9 Scores 10/23/2019 10/17/2018 10/17/2018 09/27/2017  PHQ - 2 Score 0 0 0 0  PHQ- 9 Score - 1 - -   Cognitive Function: Declined 10/23/2019. 6CIT Screen 10/11/2016  What Year? 0 points  What month? 0 points  What time? 0  points  Count back from 20 0 points  Months in reverse 0 points  Repeat phrase 2 points  Total Score 2       Assessment & Plan:    Annual Physical Reviewed patient's Family Medical History Reviewed and updated list of patient's medical providers Assessment of cognitive impairment was done Assessed patient's functional ability Established a written schedule for health screening [place] Completed and Reviewed  Exercise Activities and Dietary recommendations Goals    . DIET - INCREASE WATER INTAKE     Recommend increasing water intake to 4-6 glasses a day.    Marland Kitchen DIET - REDUCE CARBOHYDRATES     Recommend to cut back on carbohydrates to 1 serving a day and to focus on eating more good carbohydrates.        Immunization History  Administered Date(s) Administered  . Fluad Quad(high Dose 65+) 05/08/2019  . Hepatitis B, adult 05/29/2012, 07/29/2012, 03/18/2013  . Influenza, High Dose Seasonal PF 07/06/2014, 05/06/2015, 07/11/2016, 04/26/2017, 04/24/2018  . PFIZER SARS-COV-2 Vaccination 09/29/2019, 10/28/2019  . Pneumococcal Conjugate-13 07/06/2014  . Pneumococcal Polysaccharide-23 05/29/2012, 10/30/2018    Health Maintenance  Topic Date Due  . TETANUS/TDAP  Never done  . OPHTHALMOLOGY EXAM  02/22/2019  . COLONOSCOPY  10/29/2019  . FOOT EXAM  10/30/2019  . HEMOGLOBIN A1C  01/29/2020  . INFLUENZA VACCINE  Completed  . Hepatitis C Screening  Completed  . PNA vac Low Risk Adult  Completed     Discussed health benefits of physical activity, and encouraged him to engage in regular exercise appropriate for his age and condition.    1. Annual physical exam Normal exam. Up to date on screenings and vaccinations.   2. Type 2 diabetes mellitus with hyperglycemia, without long-term current use of insulin (HCC) Stop metformin currently due to diarrhea. Will start Invokana '300mg'$  as below. Continue Glipizide '10mg'$  BID. Will check labs as below and f/u pending  results. F/U in 3 months.  - CBC with Differential/Platelet - Comprehensive metabolic panel - Hemoglobin A1c - Lipid panel - canagliflozin (INVOKANA) 300 MG TABS tablet; Take 1 tablet (300 mg total) by mouth daily before breakfast.  Dispense: 90 tablet; Refill: 0 - Ambulatory referral to Ophthalmology  3. Irritable bowel syndrome with diarrhea Has been improving with decreasing artificial sugars and IBU use. Will also stop metformin as noted above. Will check labs as below and f/u pending results. Continue follow up with Dr. Vicente Males in early April. Due for colonoscopy.  - CBC with Differential/Platelet - Comprehensive metabolic panel  4. Fatty liver disease, nonalcoholic Diet controlled. Will check labs as below and f/u pending results. - CBC with Differential/Platelet - Comprehensive metabolic panel  5. Obstructive sleep apnea syndrome Having issues with CPAP. Machine is over 95 years old. Will get new CPAP titration study. From that will try to get new machine and new supplies.  - CBC with Differential/Platelet - Comprehensive metabolic panel - Home sleep test  6. Essential hypertension Stable. Continue HCTZ '25mg'$ , losartan '100mg'$ , metoprolol XR '25mg'$ . Will check  labs as below and f/u pending results. - CBC with Differential/Platelet - Comprehensive metabolic panel - Hemoglobin A1c - Lipid panel  7. Pure hypercholesterolemia Stable. Continue Simvastatin 16m. Will check labs as below and f/u pending results. - CBC with Differential/Platelet - Comprehensive metabolic panel - Hemoglobin A1c - Lipid panel  ------------------------------------------------------------------------------------------------------------    JMar Daring PA-C  [facility]

## 2019-11-06 ENCOUNTER — Encounter: Payer: Self-pay | Admitting: Physician Assistant

## 2019-11-06 ENCOUNTER — Ambulatory Visit (INDEPENDENT_AMBULATORY_CARE_PROVIDER_SITE_OTHER): Payer: PPO | Admitting: Physician Assistant

## 2019-11-06 ENCOUNTER — Other Ambulatory Visit: Payer: Self-pay

## 2019-11-06 VITALS — BP 148/89 | HR 73 | Temp 96.8°F | Resp 16 | Ht 73.0 in | Wt 225.6 lb

## 2019-11-06 DIAGNOSIS — K760 Fatty (change of) liver, not elsewhere classified: Secondary | ICD-10-CM | POA: Diagnosis not present

## 2019-11-06 DIAGNOSIS — E7800 Pure hypercholesterolemia, unspecified: Secondary | ICD-10-CM

## 2019-11-06 DIAGNOSIS — G4733 Obstructive sleep apnea (adult) (pediatric): Secondary | ICD-10-CM | POA: Diagnosis not present

## 2019-11-06 DIAGNOSIS — K580 Irritable bowel syndrome with diarrhea: Secondary | ICD-10-CM

## 2019-11-06 DIAGNOSIS — E1165 Type 2 diabetes mellitus with hyperglycemia: Secondary | ICD-10-CM

## 2019-11-06 DIAGNOSIS — I10 Essential (primary) hypertension: Secondary | ICD-10-CM | POA: Diagnosis not present

## 2019-11-06 DIAGNOSIS — E876 Hypokalemia: Secondary | ICD-10-CM

## 2019-11-06 DIAGNOSIS — Z0000 Encounter for general adult medical examination without abnormal findings: Secondary | ICD-10-CM

## 2019-11-06 MED ORDER — CANAGLIFLOZIN 300 MG PO TABS: 300.0000 mg | ORAL_TABLET | Freq: Every day | ORAL | 0 refills | Status: DC

## 2019-11-06 NOTE — Patient Instructions (Signed)
Canagliflozin oral tablets What is this medicine? CANAGLIFLOZIN (KAN a gli FLOE zin) helps to treat type 2 diabetes. It helps to control blood sugar. Treatment is combined with diet and exercise. This drug may also reduce the risk of heart attack, stroke, or death if you have type 2 diabetes and risk factors for heart disease. If you have diabetic kidney disease with a certain amount of protein in the urine, this drug may reduce your risk of end stage kidney disease (ESKD), worsened kidney function, and hospitalization for heart failure. This medicine may be used for other purposes; ask your health care provider or pharmacist if you have questions. COMMON BRAND NAME(S): Invokana What should I tell my health care provider before I take this medicine? They need to know if you have any of these conditions:  artery disease  dehydration  diabetic ketoacidosis  diet low in salt  eating less due to illness, surgery, dieting, or any other reason  foot sores  having surgery  high cholesterol  high levels of potassium in the blood  history of amputation  history of pancreatitis or pancreas problems  history of yeast infection of the penis or vagina  if you often drink alcohol  infections in the bladder, kidneys, or urinary tract  kidney disease  liver disease  low blood pressure  nerve damage  on hemodialysis  problems urinating  type 1 diabetes  uncircumcised male  an unusual or allergic reaction to canagliflozin, other medicines, foods, dyes, or preservatives  pregnant or trying to get pregnant  breast-feeding How should I use this medicine? Take this medicine by mouth with a glass of water. Follow the directions on the prescription label. Take it before the first meal of the day. Take your dose at the same time each day. Do not take more often than directed. Do not stop taking except on your doctor's advice. A special MedGuide will be given to you by the pharmacist  with each prescription and refill. Be sure to read this information carefully each time. Talk to your pediatrician regarding the use of this medicine in children. Special care may be needed. Overdosage: If you think you have taken too much of this medicine contact a poison control center or emergency room at once. NOTE: This medicine is only for you. Do not share this medicine with others. What if I miss a dose? If you miss a dose, take it as soon as you can. If it is almost time for your next dose, take only that dose. Do not take double or extra doses. What may interact with this medicine? Do not take this medicine with any of the following medications:  gatifloxacin This medicine may also interact with the following medications:  alcohol  certain medicines for blood pressure, heart disease  digoxin  diuretics  insulin  nateglinide  phenobarbital  phenytoin  repaglinide  rifampin  ritonavir  sulfonylureas like glimepiride, glipizide, glyburide This list may not describe all possible interactions. Give your health care provider a list of all the medicines, herbs, non-prescription drugs, or dietary supplements you use. Also tell them if you smoke, drink alcohol, or use illegal drugs. Some items may interact with your medicine. What should I watch for while using this medicine? Visit your doctor or health care professional for regular checks on your progress. This medicine can cause a serious condition in which there is too much acid in the blood. If you develop nausea, vomiting, stomach pain, unusual tiredness, or breathing problems, stop  taking this medicine and call your doctor right away. If possible, use a ketone dipstick to check for ketones in your urine. A test called the HbA1C (A1C) will be monitored. This is a simple blood test. It measures your blood sugar control over the last 2 to 3 months. You will receive this test every 3 to 6 months. Learn how to check your  blood sugar. Learn the symptoms of low and high blood sugar and how to manage them. Always carry a quick-source of sugar with you in case you have symptoms of low blood sugar. Examples include hard sugar candy or glucose tablets. Make sure others know that you can choke if you eat or drink when you develop serious symptoms of low blood sugar, such as seizures or unconsciousness. They must get medical help at once. Tell your doctor or health care professional if you have high blood sugar. You might need to change the dose of your medicine. If you are sick or exercising more than usual, you might need to change the dose of your medicine. Do not skip meals. Ask your doctor or health care professional if you should avoid alcohol. Many nonprescription cough and cold products contain sugar or alcohol. These can affect blood sugar. Wear a medical ID bracelet or chain, and carry a card that describes your disease and details of your medicine and dosage times. What side effects may I notice from receiving this medicine? Side effects that you should report to your doctor or health care professional as soon as possible:  allergic reactions like skin rash, itching or hives, swelling of the face, lips, or tongue  breathing problems  chest pain  dizziness  feeling faint or lightheaded, falls  muscle weakness  nausea, vomiting, unusual stomach upset or pain  new pain or tenderness, change in skin color, sores or ulcers, or infection in legs or feet  penile discharge, itching, or pain in men  signs and symptoms of a genital infection, such as fever; tenderness, redness, or swelling in the genitals or area from the genitals to the back of the rectum  signs and symptoms of low blood sugar such as feeling anxious, confusion, dizziness, increased hunger, unusually weak or tired, sweating, shakiness, cold, irritable, headache, blurred vision, fast heartbeat, loss of consciousness  signs and symptoms of a  urinary tract infection, such as fever, chills, a burning feeling when urinating, blood in the urine, back pain  tingling or numbness in the hands, legs, or feet  trouble passing urine or change in the amount of urine, including an urgent need to urinate more often, in larger amounts, or at night  unusual tiredness  vaginal discharge, itching, or odor in women Side effects that usually do not require medical attention (report to your doctor or health care professional if they continue or are bothersome):  mild increase in urination  thirsty This list may not describe all possible side effects. Call your doctor for medical advice about side effects. You may report side effects to FDA at 1-800-FDA-1088. Where should I keep my medicine? Keep out of the reach of children. Store at room temperature between 20 and 25 degrees C (68 and 77 degrees F). Throw away any unused medicine after the expiration date. NOTE: This sheet is a summary. It may not cover all possible information. If you have questions about this medicine, talk to your doctor, pharmacist, or health care provider.  2020 Elsevier/Gold Standard (2019-04-04 13:58:06)

## 2019-11-07 ENCOUNTER — Telehealth: Payer: Self-pay

## 2019-11-07 LAB — HEMOGLOBIN A1C
Est. average glucose Bld gHb Est-mCnc: 160 mg/dL
Hgb A1c MFr Bld: 7.2 % — ABNORMAL HIGH (ref 4.8–5.6)

## 2019-11-07 LAB — CBC WITH DIFFERENTIAL/PLATELET
Basophils Absolute: 0 10*3/uL (ref 0.0–0.2)
Basos: 1 %
EOS (ABSOLUTE): 0.1 10*3/uL (ref 0.0–0.4)
Eos: 2 %
Hematocrit: 38.2 % (ref 37.5–51.0)
Hemoglobin: 13.5 g/dL (ref 13.0–17.7)
Immature Grans (Abs): 0 10*3/uL (ref 0.0–0.1)
Immature Granulocytes: 0 %
Lymphocytes Absolute: 2.3 10*3/uL (ref 0.7–3.1)
Lymphs: 35 %
MCH: 30.5 pg (ref 26.6–33.0)
MCHC: 35.3 g/dL (ref 31.5–35.7)
MCV: 86 fL (ref 79–97)
Monocytes Absolute: 0.4 10*3/uL (ref 0.1–0.9)
Monocytes: 7 %
Neutrophils Absolute: 3.6 10*3/uL (ref 1.4–7.0)
Neutrophils: 55 %
Platelets: 172 10*3/uL (ref 150–450)
RBC: 4.42 x10E6/uL (ref 4.14–5.80)
RDW: 13.9 % (ref 11.6–15.4)
WBC: 6.5 10*3/uL (ref 3.4–10.8)

## 2019-11-07 LAB — COMPREHENSIVE METABOLIC PANEL
ALT: 38 IU/L (ref 0–44)
AST: 29 IU/L (ref 0–40)
Albumin/Globulin Ratio: 2.1 (ref 1.2–2.2)
Albumin: 4.4 g/dL (ref 3.7–4.7)
Alkaline Phosphatase: 58 IU/L (ref 39–117)
BUN/Creatinine Ratio: 17 (ref 10–24)
BUN: 17 mg/dL (ref 8–27)
Bilirubin Total: 0.9 mg/dL (ref 0.0–1.2)
CO2: 25 mmol/L (ref 20–29)
Calcium: 9.2 mg/dL (ref 8.6–10.2)
Chloride: 100 mmol/L (ref 96–106)
Creatinine, Ser: 1.03 mg/dL (ref 0.76–1.27)
GFR calc Af Amer: 84 mL/min/{1.73_m2} (ref >59)
GFR calc non Af Amer: 73 mL/min/{1.73_m2} (ref >59)
Globulin, Total: 2.1 g/dL (ref 1.5–4.5)
Glucose: 133 mg/dL — ABNORMAL HIGH (ref 65–99)
Potassium: 2.9 mmol/L — ABNORMAL LOW (ref 3.5–5.2)
Sodium: 144 mmol/L (ref 134–144)
Total Protein: 6.5 g/dL (ref 6.0–8.5)

## 2019-11-07 LAB — LIPID PANEL
Chol/HDL Ratio: 2.7 ratio (ref 0.0–5.0)
Cholesterol, Total: 103 mg/dL (ref 100–199)
HDL: 38 mg/dL — ABNORMAL LOW (ref >39)
LDL Chol Calc (NIH): 39 mg/dL (ref 0–99)
Triglycerides: 154 mg/dL — ABNORMAL HIGH (ref 0–149)
VLDL Cholesterol Cal: 26 mg/dL (ref 5–40)

## 2019-11-07 MED ORDER — POTASSIUM CHLORIDE CRYS ER 10 MEQ PO TBCR: 10.0000 meq | EXTENDED_RELEASE_TABLET | Freq: Every day | ORAL | 0 refills | Status: DC

## 2019-11-07 NOTE — Telephone Encounter (Signed)
-----   Message from Mar Daring, [place] sent at 11/07/2019  7:36 AM EDT ----- Blood count is normal. Kidney and liver function are normal. Sodium and calcium are normal. Potassium is low. Will send in a potassium supplement. Recheck potassium in 4 weeks. A1c is stable at 7.2. Will recheck in 3 months at next visit. Cholesterol is good.

## 2019-11-07 NOTE — Addendum Note (Signed)
Addended by: Mar Daring on: 11/07/2019 07:38 AM   Modules accepted: Orders

## 2019-11-07 NOTE — Telephone Encounter (Signed)
Patient advised as directed below. 

## 2019-11-11 ENCOUNTER — Other Ambulatory Visit: Payer: Self-pay | Admitting: Physician Assistant

## 2019-11-11 DIAGNOSIS — E119 Type 2 diabetes mellitus without complications: Secondary | ICD-10-CM

## 2019-11-11 NOTE — Telephone Encounter (Signed)
Requested medication (s) are due for refill today: yes  Requested medication (s) are on the active medication list: no  Last refill:  07/23/2019  Future visit scheduled: yes  Notes to clinic:  not on active med list    Requested Prescriptions  Pending Prescriptions Disp Refills   ONETOUCH ULTRA test strip [Pharmacy Med Name: [place] TESTST(NEW)100] 100 strip     Sig: [place]      Endocrinology: Diabetes - Testing Supplies Passed - 11/11/2019  7:05 AM      Passed - Valid encounter within last 12 months    Recent Outpatient Visits           5 days ago Annual physical exam   [facility] [name] M, PA-C   2 months ago Irritable bowel syndrome with diarrhea   [facility] [place], [place], PA-C   3 months ago Type 2 diabetes mellitus without complication, without long-term current use of insulin [facility])   [place], [place], PA-C   6 months ago Type 2 diabetes mellitus without complication, without long-term current use of insulin [facility])   [facility] [place], Clearnce Sorrel, [place]   1 year ago Annual physical exam   [facility] [place], Clearnce Sorrel, [place]       Future Appointments             In 2 days Jonathon Bellows, MD [place]   In 2 months Marlyn Corporal, Clearnce Sorrel, PA-C [employer], [place]

## 2019-11-13 ENCOUNTER — Ambulatory Visit (INDEPENDENT_AMBULATORY_CARE_PROVIDER_SITE_OTHER): Payer: PPO | Admitting: Gastroenterology

## 2019-11-13 DIAGNOSIS — R197 Diarrhea, unspecified: Secondary | ICD-10-CM

## 2019-11-13 DIAGNOSIS — Z86010 Personal history of colonic polyps: Secondary | ICD-10-CM | POA: Diagnosis not present

## 2019-11-13 DIAGNOSIS — R194 Change in bowel habit: Secondary | ICD-10-CM | POA: Diagnosis not present

## 2019-11-13 NOTE — Progress Notes (Signed)
Ivan Kennedy , MD [address]  [place], [place] [address]  Main: [phone]  Fax: [phone]   Primary Care Physician: Mar Daring, PA-C  Virtual Visit via Telephone Note  I connected with patient on 11/13/19 at  3:00 PM EDT by telephone and verified that I am speaking with the correct person using two identifiers.   I discussed the limitations, risks, security and privacy concerns of performing an evaluation and management service by telephone and the availability of in person appointments. I also discussed with the patient that there may be a patient responsible charge related to this service. The patient expressed understanding and agreed to proceed.  Location of Patient: Home Location of Provider: Home Persons involved: Patient and provider only   History of Present Illness: Chief Complaint  Patient presents with  . Follow-up    Diarrhea    HPI: Ivan Kennedy is a 74 y.o. male   Summary of history :  Initially referred and seen on 10/07/2019 for diarrhea.The patient has been on Metformin. HbA1c 7.7. Last colonoscopy was back in 2016. He has a personal history of colon polyps. Found to have diverticulosis of the colon and internal hemorrhoids. 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 Only one tissue fragment is noted. No endoscopic appearance of colitis was mentioned in the procedure note by Dr. Jamal Collin.Diarrhea on and off for over 6 months explosive, multiple episodes a day, nonbloody, no abdominal pain. 4 pounds weight loss. He has been on Metformin for many years. No change in dose recently. He takes 4 ibuprofens almost every day at night to sleep and from aches and pains related to playing golf. He also uses about 3-4 flashes of sweet and low daily in his drinks. Denies any any other artificial sweeteners or sugars. He recalls that he has a prior  history of colon polyps.   Interval history 10/16/2019-11/13/2019  Since his last visit has 1-2 soft bowel movements per day.  Consistency has improved but not exactly where he would like it to be yet.  He went off Metformin a few days back.No other complaints.    Current Outpatient Medications  Medication Sig Dispense Refill  . aspirin EC 81 MG tablet Take 81 mg by mouth daily.     . Blood Glucose Monitoring Suppl (ONE TOUCH ULTRA 2) w/Device KIT To check blood sugar once daily 1 each 0  . canagliflozin (INVOKANA) 300 MG TABS tablet Take 1 tablet (300 mg total) by mouth daily before breakfast. 90 tablet 0  . doxycycline (VIBRAMYCIN) 50 MG capsule PLEASE SEE ATTACHED FOR DETAILED DIRECTIONS    . glipiZIDE (GLUCOTROL) 10 MG tablet TAKE 1 TABLET (10 MG TOTAL) BY MOUTH 2 (TWO) TIMES DAILY BEFORE A MEAL. 180 tablet 0  . hydrochlorothiazide (HYDRODIURIL) 25 MG tablet TAKE 1 TABLET BY MOUTH EVERY DAY 90 tablet 0  . Lancets (ACCU-CHEK SOFT TOUCH) lancets To check blood glucose daily 100 each 12  . loratadine-pseudoephedrine (CLARITIN-D 24-HOUR) 10-240 MG per 24 hr tablet Take 1 tablet by mouth daily as needed for allergies.    Marland Kitchen losartan (COZAAR) 100 MG tablet TAKE 1 TABLET (100 MG TOTAL) BY MOUTH DAILY. 90 tablet 1  . metoprolol succinate (TOPROL-XL) 25 MG 24 hr tablet 1 TABLET BY MOUTH EVERY DAY 90 tablet 1  . metroNIDAZOLE (METROGEL) 1 % gel APPLY TO ROSACEA ONCE DAILY.  5  .  omeprazole (PRILOSEC) 40 MG capsule TAKE 1 CAPSULE BY MOUTH EVERY DAY 90 capsule 0  . ONETOUCH ULTRA test strip CHECK BLOOD SUGAR ONCE DAILY 100 strip 0  . potassium chloride (KLOR-CON M10) 10 MEQ tablet Take 1 tablet (10 mEq total) by mouth daily. 14 tablet 0  . simvastatin (ZOCOR) 20 MG tablet TAKE 1 TABLET BY MOUTH EVERY DAY IN THE EVENING 90 tablet 1  . tretinoin (RETIN-A) 0.05 % cream Apply 1 application topically at bedtime.   5  . vitamin B-12 (CYANOCOBALAMIN) 1000 MCG tablet Take 1,000 mcg by mouth daily.     No  current facility-administered medications for this visit.    Allergies as of 11/13/2019  . (No Known Allergies)    Review of Systems:    All systems reviewed and negative except where noted in HPI.   Observations/Objective:  Labs: CMP     Component Value Date/Time   NA 144 11/06/2019 0932   K 2.9 (L) 11/06/2019 0932   CL 100 11/06/2019 0932   CO2 25 11/06/2019 0932   GLUCOSE 133 (H) 11/06/2019 0932   GLUCOSE 142 (H) 04/26/2017 0902   BUN 17 11/06/2019 0932   CREATININE 1.03 11/06/2019 0932   CREATININE 1.00 04/26/2017 0902   CALCIUM 9.2 11/06/2019 0932   PROT 6.5 11/06/2019 0932   ALBUMIN 4.4 11/06/2019 0932   AST 29 11/06/2019 0932   ALT 38 11/06/2019 0932   ALKPHOS 58 11/06/2019 0932   BILITOT 0.9 11/06/2019 0932   GFRNONAA 73 11/06/2019 0932   GFRNONAA 76 04/26/2017 0902   GFRAA 84 11/06/2019 0932   GFRAA 89 04/26/2017 0902   Lab Results  Component Value Date   WBC 6.5 11/06/2019   HGB 13.5 11/06/2019   HCT 38.2 11/06/2019   MCV 86 11/06/2019   PLT 172 11/06/2019    Imaging Studies: No results found.  Assessment and Plan:   Ivan Kennedy is a 74 y.o. 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 Last colonoscopy in 2016 by Dr. Jamal Collin. No mention of colitis was noted but there was a single biopsy bite that was taken of the lesion which showed features of chronic active colitis. No subsequent follow-up for the same.  Initially diarrhea felt to be secondary to artificial sugars and long-term NSAID use.  He has gone off Metformin for a few days.  Metformin 2 may play a role in change in bowel movements.   Plan 1.  Presently only has 1-2 bowel movements a day.  Still softer than what he desires it to be.  He may have a component of IBS-D or it just may be too soon to see the effects of Metformin which has been stopped a few days back.  2.  Is been 5 years since his last colonoscopy and with a change in bowel habits I do believe that it is wise to proceed with a  colonoscopy, we can also rule out microscopic cholecystitis as well at the same time.  Follow-up in 4 to 6 weeks telephone visit   I have discussed alternative options, risks & benefits,  which include, but are not limited to, bleeding, infection, perforation,respiratory complication & drug reaction.  The patient agrees with this plan & written consent will be obtained.      I discussed the assessment and treatment plan with the patient. The patient was provided an opportunity to ask questions and all were answered. The patient agreed with the plan and demonstrated an understanding of the instructions.  The patient was advised to call back or seek an in-person evaluation if the symptoms worsen or if the condition fails to improve as anticipated.  I provided 15 minutes of non-face-to-face time during this encounter.  Dr Ivan Bellows MD,MRCP [facility]) Gastroenterology/Hepatology Pager: [phone]   Speech recognition software was used to dictate this note.

## 2019-11-20 ENCOUNTER — Other Ambulatory Visit: Payer: Self-pay | Admitting: Physician Assistant

## 2019-11-20 DIAGNOSIS — E119 Type 2 diabetes mellitus without complications: Secondary | ICD-10-CM

## 2019-11-21 ENCOUNTER — Telehealth: Payer: Self-pay

## 2019-11-21 NOTE — Telephone Encounter (Signed)
Called pt to inquire if he's ready to schedule his colonoscopy as planned by Dr. Vicente Males.  Unable to contact, LVM to return call

## 2019-11-23 ENCOUNTER — Other Ambulatory Visit: Payer: Self-pay | Admitting: Physician Assistant

## 2019-11-23 DIAGNOSIS — K219 Gastro-esophageal reflux disease without esophagitis: Secondary | ICD-10-CM

## 2019-11-23 NOTE — Telephone Encounter (Signed)
Requested Prescriptions  Pending Prescriptions Disp Refills  . omeprazole (PRILOSEC) 40 MG capsule [Pharmacy Med Name: OMEPRAZOLE DR 40 MG CAPSULE] 90 capsule 0    Sig: TAKE 1 CAPSULE BY MOUTH EVERY DAY     Gastroenterology: Proton Pump Inhibitors Passed - 11/23/2019  1:59 PM      Passed - Valid encounter within last 12 months    Recent Outpatient Visits          2 weeks ago Annual physical exam   [facility] [name] M, [place]   2 months ago Irritable bowel syndrome with diarrhea   [facility] [place], [place], PA-C   3 months ago Type 2 diabetes mellitus without complication, without long-term current use of insulin [facility])   [place], [place], PA-C   6 months ago Type 2 diabetes mellitus without complication, without long-term current use of insulin [facility])   [place], Clearnce Sorrel, [place]   1 year ago Annual physical exam   [place], Clearnce Sorrel, [place]      Future Appointments            In 2 months [place], Clearnce Sorrel, PA-C [employer], [place]

## 2019-12-08 ENCOUNTER — Telehealth: Payer: Self-pay

## 2019-12-08 NOTE — Telephone Encounter (Signed)
Copied from [place] [id number]. Topic: General - Inquiry >> Dec 08, 2019  3:59 PM Alease Frame wrote: Reason for CRM: patient is wanting a new breathing machine from Dr Marlyn Corporal  Pt states he was supposed to get one his last visit Please advise

## 2019-12-08 NOTE — Telephone Encounter (Signed)
Yes to the Cpap and company is [employer]. He also said that he is not sure but that since he went on the new medicine for the Diabetes every time he gets outside and the sun hits his skin burns up.

## 2019-12-08 NOTE — Telephone Encounter (Signed)
Does he mean CPAP? If so which company does he use for [place] to send that over?

## 2019-12-08 NOTE — Telephone Encounter (Signed)
Order written, last notes printed. May have to check health data archive for sleep study as I do not see it in Epic

## 2019-12-10 ENCOUNTER — Telehealth: Payer: Self-pay | Admitting: Physician Assistant

## 2019-12-10 NOTE — Telephone Encounter (Signed)
Pt last seen jennifer in march 2021. Pt would like jenni to return his call concerning dm medication. Pt said the medication does not seem to be working like the other med he was on. Pt will make an appt if jenni would like him to. Please advice

## 2019-12-11 NOTE — Telephone Encounter (Signed)
Faxed to [employer] °

## 2019-12-11 NOTE — Telephone Encounter (Signed)
Has he noticed any improvements in his BM being off the metformin?

## 2019-12-11 NOTE — Telephone Encounter (Signed)
Spoke with patient and he reports that he just want Ivan Kennedy to know that his sugars levels are running a little higher with the invokana then it was with the Metformin. Reports that his reading are between 175-225. He said he was told to call with this reports in 2-3 weeks from when he was seen. Reports no side effect from the [place].

## 2019-12-12 NOTE — Telephone Encounter (Signed)
Reports that he is not having problems with BM that is back to normal. He is just not sure if maybe he needs a stronger dose of the Invokana.

## 2019-12-12 NOTE — Telephone Encounter (Signed)
Patient advised as directed below. Patient reports that he will call when he starts to run low.

## 2019-12-12 NOTE — Telephone Encounter (Signed)
He is on max dose of Invokana. We could continue invokana for now and see if blood sugars improve over time. If not, when he is running low we can change. Another thought would be to add metormin back in but just a 500mg  once daily.

## 2019-12-13 ENCOUNTER — Other Ambulatory Visit: Payer: Self-pay | Admitting: Physician Assistant

## 2019-12-13 DIAGNOSIS — I10 Essential (primary) hypertension: Secondary | ICD-10-CM

## 2019-12-23 ENCOUNTER — Telehealth: Payer: Self-pay

## 2019-12-23 DIAGNOSIS — G4733 Obstructive sleep apnea (adult) (pediatric): Secondary | ICD-10-CM

## 2019-12-23 NOTE — Telephone Encounter (Signed)
Patient was advised that he will need a new sleep study done. He agree.

## 2019-12-24 NOTE — Telephone Encounter (Signed)
Ordered

## 2020-01-01 DIAGNOSIS — R0602 Shortness of breath: Secondary | ICD-10-CM | POA: Diagnosis not present

## 2020-01-01 DIAGNOSIS — G4733 Obstructive sleep apnea (adult) (pediatric): Secondary | ICD-10-CM | POA: Diagnosis not present

## 2020-01-01 LAB — PULMONARY FUNCTION TEST

## 2020-01-02 DIAGNOSIS — R0602 Shortness of breath: Secondary | ICD-10-CM | POA: Diagnosis not present

## 2020-01-02 DIAGNOSIS — G4733 Obstructive sleep apnea (adult) (pediatric): Secondary | ICD-10-CM | POA: Diagnosis not present

## 2020-01-08 NOTE — Telephone Encounter (Signed)
Called and left a message for call back. Sent letter  

## 2020-01-13 ENCOUNTER — Ambulatory Visit: Payer: PPO | Admitting: Gastroenterology

## 2020-01-13 ENCOUNTER — Other Ambulatory Visit: Payer: Self-pay

## 2020-01-13 ENCOUNTER — Telehealth: Payer: Self-pay

## 2020-01-13 VITALS — BP 115/73 | HR 75 | Temp 97.9°F | Ht 73.0 in | Wt 223.0 lb

## 2020-01-13 DIAGNOSIS — Z86010 Personal history of colonic polyps: Secondary | ICD-10-CM

## 2020-01-13 NOTE — Telephone Encounter (Signed)
Copied from [place] [phone]. Topic: General - Other >> Jan 13, 2020 11:37 AM Greggory Keen D wrote: Reason for CRM: Pt called saying someone came out to the house for a sleep study and he needs a new c-pap but he only wants it from [facility].  He does not want to use any other supplier. CB#  [phone]-

## 2020-01-13 NOTE — Progress Notes (Signed)
Jonathon Bellows MD, MRCP(U.K) [address]  [place]  [place], [place] [address]  Main: [phone]  Fax: [phone]   Primary Care Physician: Mar Daring, PA-C  Primary Gastroenterologist:  Dr. Jonathon Bellows    Diarrhea follow-up  HPI: Ivan Kennedy is a 74 y.o. male   Summary of history :  Initially referred and seen on 10/07/2019 for diarrhea.The patient has been on Metformin. HbA1c 7.7. Last colonoscopy was back in 2016. He has a personal history of colon polyps. Found to have diverticulosis of the colon and internal hemorrhoids. 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 Only one tissue fragment is noted. No endoscopic appearance of colitis was mentioned in the procedure note by Dr. Jamal Collin.Diarrheaon andoff for over 6 months explosive, multiple episodes a day, nonbloody, no abdominal pain. 4 pounds weight loss. He has been on Metformin for many years. No change in dose recently. He takes 4 ibuprofens almost every day at night to sleep and from aches and pains related to playing golf. He also uses about 3-4 flashes of sweet and low daily in his drinks. Denies any any other artificial sweeteners or sugars. He recalls that he has a prior history of colon polyps.   Interval history 11/13/2019-01/13/2020  Since last visit doing very well.  No diarrhea.  Stop Metformin.  No other complaints.    Current Outpatient Medications  Medication Sig Dispense Refill  . aspirin EC 81 MG tablet Take 81 mg by mouth daily.     . Blood Glucose Monitoring Suppl (ONE TOUCH ULTRA 2) w/Device KIT To check blood sugar once daily 1 each 0  . canagliflozin (INVOKANA) 300 MG TABS tablet Take 1 tablet (300 mg total) by mouth daily before breakfast. 90 tablet 0  . doxycycline (VIBRAMYCIN) 50 MG capsule PLEASE SEE ATTACHED FOR DETAILED DIRECTIONS    . glipiZIDE (GLUCOTROL) 10 MG tablet TAKE 1  TABLET (10 MG TOTAL) BY MOUTH 2 (TWO) TIMES DAILY BEFORE A MEAL. 180 tablet 0  . hydrochlorothiazide (HYDRODIURIL) 25 MG tablet TAKE 1 TABLET BY MOUTH EVERY DAY 90 tablet 0  . Lancets (ACCU-CHEK SOFT TOUCH) lancets To check blood glucose daily 100 each 12  . loratadine-pseudoephedrine (CLARITIN-D 24-HOUR) 10-240 MG per 24 hr tablet Take 1 tablet by mouth daily as needed for allergies.    Marland Kitchen losartan (COZAAR) 100 MG tablet TAKE 1 TABLET (100 MG TOTAL) BY MOUTH DAILY. 90 tablet 1  . metoprolol succinate (TOPROL-XL) 25 MG 24 hr tablet 1 TABLET BY MOUTH EVERY DAY 90 tablet 1  . metroNIDAZOLE (METROGEL) 1 % gel APPLY TO ROSACEA ONCE DAILY.  5  . omeprazole (PRILOSEC) 40 MG capsule TAKE 1 CAPSULE BY MOUTH EVERY DAY 90 capsule 0  . ONETOUCH ULTRA test strip CHECK BLOOD SUGAR ONCE DAILY 100 strip 0  . potassium chloride (KLOR-CON M10) 10 MEQ tablet Take 1 tablet (10 mEq total) by mouth daily. 14 tablet 0  . simvastatin (ZOCOR) 20 MG tablet TAKE 1 TABLET BY MOUTH EVERY DAY IN THE EVENING 90 tablet 1  . tretinoin (RETIN-A) 0.05 % cream Apply 1 application topically at bedtime.   5  . vitamin B-12 (CYANOCOBALAMIN) 1000 MCG tablet Take 1,000 mcg by mouth daily.     No current facility-administered medications for this visit.    Allergies as of 01/13/2020  . (No Known Allergies)    ROS:  General: Negative for anorexia, weight loss,  fever, chills, fatigue, weakness. ENT: Negative for hoarseness, difficulty swallowing , nasal congestion. CV: Negative for chest pain, angina, palpitations, dyspnea on exertion, peripheral edema.  Respiratory: Negative for dyspnea at rest, dyspnea on exertion, cough, sputum, wheezing.  GI: See history of present illness. GU:  Negative for dysuria, hematuria, urinary incontinence, urinary frequency, nocturnal urination.  Endo: Negative for unusual weight change.    Physical Examination:   There were no vitals taken for this visit.  General: Well-nourished,  well-developed in no acute distress.  Eyes: No icterus. Conjunctivae pink. Mouth: Oropharyngeal mucosa moist and pink , no lesions erythema or exudate. Lungs: Clear to auscultation bilaterally. Non-labored. Heart: Regular rate and rhythm, no murmurs rubs or gallops.  Abdomen: Bowel sounds are normal, nontender, nondistended, no hepatosplenomegaly or masses, no abdominal bruits or hernia , no rebound or guarding.   Extremities: No lower extremity edema. No clubbing or deformities. Neuro: Alert and oriented x 3.  Grossly intact. Skin: Warm and dry, no jaundice.   Psych: Alert and cooperative, normal mood and affect.   Imaging Studies: No results found.  Assessment and Plan:   Ivan Kennedy is a 74 y.o. y/o male here to follow-upfor IBS-D. Last colonoscopy in 2016 by Dr. Jamal Collin. No mention of colitis was noted but there was a single biopsy bite that was taken of the lesion which showed features of chronic active colitis. No subsequent follow-up for the same.  Initially diarrhea felt to be secondary to artificial sugars and long-term NSAID use.    Subsequently when Metformin was stopped his diarrhea resolved completely.  No acute issues today.  Agrees to proceed with colonoscopy due to personal history of colon polyps and been 5 years since his last one.  I have discussed alternative options, risks & benefits,  which include, but are not limited to, bleeding, infection, perforation,respiratory complication & drug reaction.  The patient agrees with this plan & written consent will be obtained.     Dr Jonathon Bellows  MD,MRCP [facility]) Follow up in as needed

## 2020-01-14 NOTE — Telephone Encounter (Signed)
LM that he needs a new sleep study and once we have the new sleep study that we will get the new cpap machine from [employer]. If patient calls back ok for [facility] nurse to give message.

## 2020-01-14 NOTE — Telephone Encounter (Signed)
Pt returning call. Reports he had an in home sleep study done 5/21-22. States [employer] was company with [employer] as [employer] States equipment was picked up and he was told   "All was in place". States he has not heard back from [employer].

## 2020-01-14 NOTE — Telephone Encounter (Signed)
FYI

## 2020-01-15 ENCOUNTER — Other Ambulatory Visit: Payer: Self-pay

## 2020-01-15 ENCOUNTER — Other Ambulatory Visit
Admission: RE | Admit: 2020-01-15 | Discharge: 2020-01-15 | Disposition: A | Discharge status: Home / Self Care | Payer: PPO | Source: Ambulatory Visit | Attending: Gastroenterology | Admitting: Gastroenterology

## 2020-01-15 DIAGNOSIS — Z20822 Contact with and (suspected) exposure to covid-19: Secondary | ICD-10-CM | POA: Insufficient documentation

## 2020-01-15 DIAGNOSIS — Z01812 Encounter for preprocedural laboratory examination: Secondary | ICD-10-CM | POA: Diagnosis not present

## 2020-01-15 LAB — SARS CORONAVIRUS 2 (TAT 6-24 HRS): SARS Coronavirus 2: NEGATIVE

## 2020-01-16 ENCOUNTER — Telehealth: Payer: Self-pay | Admitting: Physician Assistant

## 2020-01-16 NOTE — Telephone Encounter (Signed)
Paperwork has been faxed to [employer] [facility] with prescription, new sleep study and approval letter and office notes. Per [employer] is in network.

## 2020-01-16 NOTE — Telephone Encounter (Signed)
Hinton Dyer, from [facility] called stating that she is needing to follow up with nurse regarding the prescription for pts cpap machine. Hinton Dyer states that the pt has called her and that he is becoming angry and that she would like to resolve this as quickly as possible. Please advise.     [facility]

## 2020-01-19 ENCOUNTER — Ambulatory Visit
Admission: RE | Admit: 2020-01-19 | Discharge: 2020-01-19 | Disposition: A | Discharge status: Home / Self Care | Payer: PPO | Attending: Gastroenterology | Admitting: Gastroenterology

## 2020-01-19 ENCOUNTER — Encounter
Admission: RE | Disposition: A | Discharge status: Home / Self Care | Payer: Self-pay | Source: Home / Self Care | Attending: Gastroenterology

## 2020-01-19 ENCOUNTER — Ambulatory Visit: Payer: PPO | Admitting: Anesthesiology

## 2020-01-19 ENCOUNTER — Encounter: Payer: Self-pay | Admitting: Gastroenterology

## 2020-01-19 ENCOUNTER — Telehealth: Payer: Self-pay

## 2020-01-19 ENCOUNTER — Other Ambulatory Visit: Payer: Self-pay

## 2020-01-19 DIAGNOSIS — D122 Benign neoplasm of ascending colon: Secondary | ICD-10-CM | POA: Diagnosis not present

## 2020-01-19 DIAGNOSIS — D123 Benign neoplasm of transverse colon: Secondary | ICD-10-CM | POA: Insufficient documentation

## 2020-01-19 DIAGNOSIS — Z86010 Personal history of colonic polyps: Secondary | ICD-10-CM | POA: Insufficient documentation

## 2020-01-19 DIAGNOSIS — E785 Hyperlipidemia, unspecified: Secondary | ICD-10-CM | POA: Diagnosis not present

## 2020-01-19 DIAGNOSIS — Z1211 Encounter for screening for malignant neoplasm of colon: Secondary | ICD-10-CM | POA: Diagnosis not present

## 2020-01-19 DIAGNOSIS — I10 Essential (primary) hypertension: Secondary | ICD-10-CM | POA: Diagnosis not present

## 2020-01-19 DIAGNOSIS — D126 Benign neoplasm of colon, unspecified: Secondary | ICD-10-CM

## 2020-01-19 DIAGNOSIS — Z833 Family history of diabetes mellitus: Secondary | ICD-10-CM | POA: Insufficient documentation

## 2020-01-19 DIAGNOSIS — Z7982 Long term (current) use of aspirin: Secondary | ICD-10-CM | POA: Insufficient documentation

## 2020-01-19 DIAGNOSIS — K219 Gastro-esophageal reflux disease without esophagitis: Secondary | ICD-10-CM | POA: Insufficient documentation

## 2020-01-19 DIAGNOSIS — D120 Benign neoplasm of cecum: Secondary | ICD-10-CM | POA: Diagnosis not present

## 2020-01-19 DIAGNOSIS — Z7984 Long term (current) use of oral hypoglycemic drugs: Secondary | ICD-10-CM | POA: Insufficient documentation

## 2020-01-19 DIAGNOSIS — G4730 Sleep apnea, unspecified: Secondary | ICD-10-CM | POA: Diagnosis not present

## 2020-01-19 DIAGNOSIS — K5730 Diverticulosis of large intestine without perforation or abscess without bleeding: Secondary | ICD-10-CM | POA: Diagnosis not present

## 2020-01-19 DIAGNOSIS — K5790 Diverticulosis of intestine, part unspecified, without perforation or abscess without bleeding: Secondary | ICD-10-CM | POA: Diagnosis not present

## 2020-01-19 DIAGNOSIS — Z79899 Other long term (current) drug therapy: Secondary | ICD-10-CM | POA: Insufficient documentation

## 2020-01-19 DIAGNOSIS — K635 Polyp of colon: Secondary | ICD-10-CM | POA: Diagnosis not present

## 2020-01-19 DIAGNOSIS — Z96651 Presence of right artificial knee joint: Secondary | ICD-10-CM | POA: Insufficient documentation

## 2020-01-19 DIAGNOSIS — E119 Type 2 diabetes mellitus without complications: Secondary | ICD-10-CM | POA: Insufficient documentation

## 2020-01-19 HISTORY — DX: Gastro-esophageal reflux disease without esophagitis: K21.9

## 2020-01-19 HISTORY — PX: COLONOSCOPY WITH PROPOFOL: SHX5780

## 2020-01-19 LAB — GLUCOSE, CAPILLARY: Glucose-Capillary: 201 mg/dL — ABNORMAL HIGH (ref 70–99)

## 2020-01-19 SURGERY — COLONOSCOPY WITH PROPOFOL
Anesthesia: General

## 2020-01-19 MED ORDER — PROPOFOL 500 MG/50ML IV EMUL
INTRAVENOUS | Status: DC | PRN
  Administered 2020-01-19: 140 ug/kg/min via INTRAVENOUS

## 2020-01-19 MED ORDER — PHENYLEPHRINE HCL (PRESSORS) 10 MG/ML IV SOLN
INTRAVENOUS | Status: DC | PRN
  Administered 2020-01-19: 100 ug via INTRAVENOUS
  Administered 2020-01-19: 150 ug via INTRAVENOUS
  Administered 2020-01-19: 100 ug via INTRAVENOUS

## 2020-01-19 MED ORDER — SODIUM CHLORIDE 0.9 % IV SOLN
INTRAVENOUS | Status: DC
  Administered 2020-01-19: 1000 mL via INTRAVENOUS

## 2020-01-19 MED ORDER — PROPOFOL 10 MG/ML IV BOLUS
INTRAVENOUS | Status: DC | PRN
  Administered 2020-01-19: 80 mg via INTRAVENOUS

## 2020-01-19 NOTE — Op Note (Signed)
[facility] Gastroenterology Patient Name: Ivan Kennedy Procedure Date: 01/19/2020 10:46 AM MRN: [id number] Account #: [id number] Date of Birth: 1945/12/05 Admit Type: Outpatient Age: 74 Room: [facility] ENDO ROOM 4 Gender: Male Note Status: Finalized Procedure:             Colonoscopy Indications:           High risk colon cancer surveillance: Personal history                         of colonic polyps, Last colonoscopy: March 2016 Providers:             Jonathon Bellows MD, MD Medicines:             Monitored Anesthesia Care Complications:         No immediate complications. Procedure:             Pre-Anesthesia Assessment:                        - Prior to the procedure, a History and Physical was                         performed, and patient medications, allergies and                         sensitivities were reviewed. The patient's tolerance                         of previous anesthesia was reviewed.                        - The risks and benefits of the procedure and the                         sedation options and risks were discussed with the                         patient. All questions were answered and informed                         consent was obtained.                        - ASA Grade Assessment: II - A patient with mild                         systemic disease.                        After obtaining informed consent, the colonoscope was                         passed under direct vision. Throughout the procedure,                         the patient's blood pressure, pulse, and oxygen                         saturations were monitored continuously. The  Colonoscope was introduced through the anus and                         advanced to the the cecum, identified by the                         appendiceal orifice. The colonoscopy was performed                         with ease. The patient tolerated the procedure well.          The quality of the bowel preparation was good. Findings:      The perianal and digital rectal examinations were normal.      Two sessile polyps were found in the cecum. The polyps were 4 to 6 mm in       size. These polyps were removed with a cold snare. Resection and       retrieval were complete.      Two sessile polyps were found in the ascending colon. The polyps were 6       to 8 mm in size. These polyps were removed with a cold snare. Resection       and retrieval were complete.      Two sessile polyps were found in the transverse colon. The polyps were 4       to 6 mm in size. These polyps were removed with a cold snare. Resection       and retrieval were complete.      Multiple small-mouthed diverticula were found in the sigmoid colon.      The exam was otherwise without abnormality on direct and retroflexion       views. Impression:            - Two 4 to 6 mm polyps in the cecum, removed with a                         cold snare. Resected and retrieved.                        - Two 6 to 8 mm polyps in the ascending colon, removed                         with a cold snare. Resected and retrieved.                        - Two 4 to 6 mm polyps in the transverse colon,                         removed with a cold snare. Resected and retrieved.                        - Diverticulosis in the sigmoid colon.                        - The examination was otherwise normal on direct and                         retroflexion views. Recommendation:        - Discharge patient to home (with escort).                        -  Resume previous diet.                        - Continue present medications.                        - Await pathology results.                        - Repeat colonoscopy for surveillance based on                         pathology results. Procedure Code(s):     --- Professional ---                        [phone], Colonoscopy, flexible; with removal of                          tumor(s), polyp(s), or other lesion(s) by snare                         technique Diagnosis Code(s):     --- Professional ---                        Z86.010, Personal history of colonic polyps                        K63.5, Polyp of colon                        K57.30, Diverticulosis of large intestine without                         perforation or abscess without bleeding CPT copyright 2019 American Medical Association. All rights reserved. The codes documented in this report are preliminary and upon coder review may  be revised to meet current compliance requirements. Jonathon Bellows, MD Jonathon Bellows MD, MD 01/19/2020 11:23:56 AM This report has been signed electronically. Number of Addenda: 0 Note Initiated On: 01/19/2020 10:46 AM Scope Withdrawal Time: 0 hours 20 minutes 21 seconds  Total Procedure Duration: 0 hours 23 minutes 18 seconds  Estimated Blood Loss:  Estimated blood loss: none.      [facility]

## 2020-01-19 NOTE — Anesthesia Preprocedure Evaluation (Signed)
Anesthesia Evaluation  Patient identified by MRN, date of birth, ID band Patient awake    Reviewed: Allergy & Precautions, H&P , NPO status , Patient's Chart, lab work & pertinent test results, reviewed documented beta blocker date and time   Airway Mallampati: II   Neck ROM: full    Dental  (+) Poor Dentition   Pulmonary sleep apnea ,    Pulmonary exam normal        Cardiovascular Exercise Tolerance: Good hypertension, On Medications negative cardio ROS Normal cardiovascular exam Rhythm:regular Rate:Normal     Neuro/Psych negative neurological ROS  negative psych ROS   GI/Hepatic Neg liver ROS, GERD  Medicated,  Endo/Other  negative endocrine ROSdiabetes, Well Controlled, Type 2, Oral Hypoglycemic Agents  Renal/GU negative Renal ROS  negative genitourinary   Musculoskeletal   Abdominal   Peds  Hematology negative hematology ROS (+)   Anesthesia Other Findings Past Medical History: 2005: Colon polyp No date: Diabetes mellitus ([place]) No date: GERD (gastroesophageal reflux disease) No date: Hyperlipidemia No date: Hypertension No date: Rosacea No date: Sleep apnea Past Surgical History: 1998: ANAL FISSURE REPAIR 2005, 2010: COLONOSCOPY 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 Earnestine Leys 2000: shoulder spurs; Right BMI    Body Mass Index: 30.11 kg/m     Reproductive/Obstetrics negative OB ROS                             Anesthesia Physical Anesthesia Plan  ASA: III  Anesthesia Plan: General   Post-op Pain Management:    Induction:   PONV Risk Score and Plan:   Airway Management Planned:   Additional Equipment:   Intra-op Plan:   Post-operative Plan:   Informed Consent: I have reviewed the patients History and Physical, chart, labs and discussed the procedure including the risks, benefits and alternatives for the proposed anesthesia with the  patient or authorized representative who has indicated his/her understanding and acceptance.     Dental Advisory Given  Plan Discussed with: CRNA  Anesthesia Plan Comments:         Anesthesia Quick Evaluation

## 2020-01-19 NOTE — Telephone Encounter (Signed)
Called patient and he just wanted to know if I had faxed the prescription to [facility] and he was advised that this was faxed on Friday but I will checked with [facility] tomorrow.

## 2020-01-19 NOTE — H&P (Signed)
Ivan Bellows, MD [address] [place], [place], [place], [id number], [place], [place], [place], [address] Phone: [phone]  Fax: [phone]  Primary Care Physician:  Mar Daring, PA-C   Pre-Procedure History & Physical: HPI:  Ivan Kennedy is a 74 y.o. male is here for an colonoscopy.   Past Medical History:  Diagnosis Date  . Colon polyp 2005  . Diabetes mellitus ([place])   . GERD (gastroesophageal reflux disease)   . Hyperlipidemia   . Hypertension   . Rosacea   . Sleep apnea     Past Surgical History:  Procedure Laterality Date  . ANAL FISSURE REPAIR  1998  . COLONOSCOPY  2005, 2010  . 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 Earnestine Leys  . shoulder spurs Right 2000    Prior to Admission medications   Medication Sig Start Date End Date Taking? Authorizing Provider  aspirin EC 81 MG tablet Take 81 mg by mouth daily.    Yes [provider]  canagliflozin (INVOKANA) 300 MG TABS tablet Take 1 tablet (300 mg total) by mouth daily before breakfast. 11/06/19  Yes Burnette, Clearnce Sorrel, PA-C  doxycycline (VIBRAMYCIN) 50 MG capsule PLEASE SEE ATTACHED FOR DETAILED DIRECTIONS 09/29/19  Yes [provider]  glipiZIDE (GLUCOTROL) 10 MG tablet TAKE 1 TABLET (10 MG TOTAL) BY MOUTH 2 (TWO) TIMES DAILY BEFORE A MEAL. 10/30/19  Yes [name] M, PA-C  hydrochlorothiazide (HYDRODIURIL) 25 MG tablet TAKE 1 TABLET BY MOUTH EVERY DAY 10/30/19  Yes [name] M, PA-C  loratadine-pseudoephedrine (CLARITIN-D 24-HOUR) 10-240 MG per 24 hr tablet Take 1 tablet by mouth daily as needed for allergies.   Yes [provider]  losartan (COZAAR) 100 MG tablet TAKE 1 TABLET (100 MG TOTAL) BY MOUTH DAILY. 12/13/19  Yes Mar Daring, PA-C  metoprolol succinate (TOPROL-XL) 25 MG 24 hr tablet 1 TABLET BY MOUTH EVERY DAY 12/13/19  Yes Mar Daring, PA-C  omeprazole (PRILOSEC) 40 MG capsule TAKE 1 CAPSULE BY MOUTH EVERY DAY  11/23/19  Yes [name] M, PA-C  potassium chloride (KLOR-CON M10) 10 MEQ tablet Take 1 tablet (10 mEq total) by mouth daily. 11/07/19  Yes [name] M, PA-C  simvastatin (ZOCOR) 20 MG tablet TAKE 1 TABLET BY MOUTH EVERY DAY IN THE EVENING 10/06/19  Yes Burnette, Clearnce Sorrel, PA-C  vitamin B-12 (CYANOCOBALAMIN) 1000 MCG tablet Take 1,000 mcg by mouth daily.   Yes [provider]  Blood Glucose Monitoring Suppl (ONE TOUCH ULTRA 2) w/Device KIT To check blood sugar once daily 08/08/17   [name] M, PA-C  Lancets (ACCU-CHEK SOFT TOUCH) lancets To check blood glucose daily 08/22/17   Mar Daring, PA-C  metroNIDAZOLE (METROGEL) 1 % gel APPLY TO ROSACEA ONCE DAILY. 03/30/15   [provider]  ONETOUCH ULTRA test strip CHECK BLOOD SUGAR ONCE DAILY 11/11/19   Mar Daring, PA-C  tretinoin (RETIN-A) 0.05 % cream Apply 1 application topically at bedtime.  08/30/17   [provider]    Allergies as of 01/14/2020  . (No Known Allergies)    Family History  Problem Relation Age of Onset  . Diabetes Mother     Social History   Socioeconomic History  . Marital status: Married    Spouse name: Not on file  . Number of children: 1  . Years of education: Not on file  . Highest education level: GED or equivalent  Occupational History  . Occupation: retired  Tobacco  Use  . Smoking status: Never Smoker  . Smokeless tobacco: Never Used  Substance and Sexual Activity  . Alcohol use: Yes    Alcohol/week: 0.0 standard drinks    Comment: occasionally- beer 1-2/month  . Drug use: No  . Sexual activity: Not on file  Other Topics Concern  . Not on file  Social History Narrative  . Not on file   Social Determinants of Health   Financial Resource Strain: Low Risk   . Difficulty of Paying Living Expenses: Not hard at all  Food Insecurity: No Food Insecurity  . Worried About Charity fundraiser in the Last Year: Never true  . Ran Out of  Food in the Last Year: Never true  Transportation Needs: No Transportation Needs  . Lack of Transportation (Medical): No  . Lack of Transportation (Non-Medical): No  Physical Activity: Inactive  . Days of Exercise per Week: 0 days  . Minutes of Exercise per Session: 0 min  Stress: No Stress Concern Present  . Feeling of Stress : Not at all  Social Connections: Not Isolated  . Frequency of Communication with Friends and Family: More than three times a week  . Frequency of Social Gatherings with Friends and Family: More than three times a week  . Attends Religious Services: More than 4 times per year  . Active Member of Clubs or Organizations: Yes  . Attends Archivist Meetings: More than 4 times per year  . Marital Status: Married  Human resources officer Violence: Not At Risk  . Fear of Current or Ex-Partner: No  . Emotionally Abused: No  . Physically Abused: No  . Sexually Abused: No    Review of Systems: See HPI, otherwise negative ROS  Physical Exam: BP (!) 137/91   Pulse 96   Temp 97.6 F (36.4 C) (Tympanic)   Resp 18   Ht 6' (1.829 m)   Wt 100.7 kg   SpO2 100%   BMI 30.11 kg/m  General:   Alert,  pleasant and cooperative in NAD Head:  Normocephalic and atraumatic. Neck:  Supple; no masses or thyromegaly. Lungs:  Clear throughout to auscultation, normal respiratory effort.    Heart:  +S1, +S2, Regular rate and rhythm, No edema. Abdomen:  Soft, nontender and nondistended. Normal bowel sounds, without guarding, and without rebound.   Neurologic:  Alert and  oriented x4;  grossly normal neurologically.  Impression/Plan: Ivan Kennedy is here for an colonoscopy to be performed for surveillance due to prior history of colon polyps   Risks, benefits, limitations, and alternatives regarding  colonoscopy have been reviewed with the patient.  Questions have been answered.  All parties agreeable.   Ivan Bellows, MD  01/19/2020, 10:44 AM

## 2020-01-19 NOTE — Telephone Encounter (Signed)
Copied from [place] [id number]. Topic: General - Inquiry >> Jan 19, 2020  1:45 PM Mathis Bud wrote: Reason for CRM: Patient is requesting a call back from [facility] regarding cpap machine.  Call back [phone]

## 2020-01-19 NOTE — Transfer of Care (Signed)
Immediate Anesthesia Transfer of Care Note  Patient: Ivan Kennedy  Procedure(s) Performed: COLONOSCOPY WITH PROPOFOL (N/A )  Patient Location: PACU  Anesthesia Type:General  Level of Consciousness: sedated  Airway & Oxygen Therapy: Patient Spontanous Breathing  Post-op Assessment: Report given to RN and Post -op Vital signs reviewed and stable  Post vital signs: Reviewed and stable  Last Vitals:  Vitals Value Taken Time  BP 93/45 01/19/20 1124  Temp    Pulse 62 01/19/20 1124  Resp 18 01/19/20 1124  SpO2 95 % 01/19/20 1124  Vitals shown include unvalidated device data.  Last Pain:  Vitals:   01/19/20 1006  TempSrc: Tympanic  PainSc: 0-No pain         Complications: No apparent anesthesia complications

## 2020-01-20 ENCOUNTER — Encounter: Payer: Self-pay | Admitting: Gastroenterology

## 2020-01-20 ENCOUNTER — Ambulatory Visit: Payer: Self-pay

## 2020-01-20 ENCOUNTER — Encounter: Payer: Self-pay | Admitting: *Deleted

## 2020-01-20 LAB — SURGICAL PATHOLOGY

## 2020-01-20 NOTE — Telephone Encounter (Signed)
I called and spoke with patient. He says he doesn't have a blood pressure monitor at home to check his blood pressure. He has had 2-3 bottles of water today to stay hydrated. He reports the dizziness occurs when he changes from a sitting to standing position. He states he feels fine sitting in a chair. Patient states his sugars have been high since [place] took him off Metformin 2-3 months ago and changed him to [place]. Patient was not able to take Metformin because it caused problems with his stomach. He says that when he was taking Metformin his sugars ranged from 135-150. Since being on Invokana his sugars have averaged 160-180. He has a follow up appointment scheduled with Tawanna Sat next week on 01/29/2020 at 7am.

## 2020-01-20 NOTE — Anesthesia Postprocedure Evaluation (Signed)
Anesthesia Post Note  Patient: Ivan Kennedy  Procedure(s) Performed: COLONOSCOPY WITH PROPOFOL (N/A )  Patient location during evaluation: PACU Anesthesia Type: General Level of consciousness: awake and alert Pain management: pain level controlled Vital Signs Assessment: post-procedure vital signs reviewed and stable Respiratory status: spontaneous breathing, nonlabored ventilation, respiratory function stable and patient connected to nasal cannula oxygen Cardiovascular status: blood pressure returned to baseline and stable Postop Assessment: no apparent nausea or vomiting Anesthetic complications: no     Last Vitals:  Vitals:   01/19/20 1144 01/19/20 1154  BP: 118/80 (!) 102/55  Pulse: 65 60  Resp: (!) 21 16  Temp:    SpO2: 97% 98%    Last Pain:  Vitals:   01/19/20 1154  TempSrc:   PainSc: 0-No pain                 Molli Barrows

## 2020-01-20 NOTE — Telephone Encounter (Signed)
Pt. Called back and given Dr. Maralyn Sago recommendation. Appointment made for Friday. Please notify pt. If an appointment becomes available sooner.

## 2020-01-20 NOTE — Telephone Encounter (Signed)
I think he should move his follow up appt with Sonia Baller up to this Friday

## 2020-01-20 NOTE — Telephone Encounter (Signed)
Has he checked his blood pressure. It's not unusual to be little dehydrated after procedure.

## 2020-01-20 NOTE — Telephone Encounter (Addendum)
Pt. Reports he had a colonoscopy yesterday. Taking medications as ordered. This morning his fasting blood sugar was 217. Usually 175-195. 2 hours after lunch today glucose is 333. Feels dizzy when he bends over. Is pushing some water to help bring it down. Please advise pt.  Answer Assessment - Initial Assessment Questions 1. BLOOD GLUCOSE: "What is your blood glucose level?"      Fasting this morning 217   After lunch 333 2. ONSET: "When did you check the blood glucose?"     Today 3. USUAL RANGE: "What is your glucose level usually?" (e.g., usual fasting morning value, usual evening value)     Fasting 175-195 4. KETONES: "Do you check for ketones (urine or blood test strips)?" If yes, ask: "What does the test show now?"      No 5. TYPE 1 or 2:  "Do you know what type of diabetes you have?"  (e.g., Type 1, Type 2, Gestational; doesn't know)      Type 2 6. INSULIN: "Do you take insulin?" "What type of insulin(s) do you use? What is the mode of delivery? (syringe, pen; injection or pump)?"      No 7. DIABETES PILLS: "Do you take any pills for your diabetes?" If yes, ask: "Have you missed taking any pills recently?"     No missed doses 8. OTHER SYMPTOMS: "Do you have any symptoms?" (e.g., fever, frequent urination, difficulty breathing, dizziness, weakness, vomiting)     Dizzy with bending 9. PREGNANCY: "Is there any chance you are pregnant?" "When was your last menstrual period?"     n/a  Protocols used: DIABETES - HIGH BLOOD SUGAR-A-AH

## 2020-01-22 NOTE — Progress Notes (Signed)
Established patient visit   Patient: Ivan Kennedy   DOB: 08-02-1946   74 y.o. Male  MRN: [id number] Visit Date: 01/23/2020  Today's healthcare provider: Mar Daring, PA-C   Chief Complaint  Patient presents with  . Follow-up    T2DM   Subjective    HPI Type 2 diabetes mellitus with hyperglycemia:Patient was seen 2-3 months ago. Patient was advised to stop metformin currently due to diarrhea. Will start Invokana 35m. Continue Glipizide 142mBID.He states his sugars have been high since taken off Metformin 2-3 months ago and was changed him to InOptimaPatient was not able to take Metformin because it caused problems with his stomach. He says that when he was taking Metformin his sugars ranged from 135-150. Since being on Invokana his sugars have averaged 160-180. He has been experiencing some dizziness. Dizziness occurs when he changes from a sitting to standing position or when he bends over. He states he feels fine sitting in a chair.Reports that he feels sometimes like he is going to pass out. He keeps a dull/fog headache. Reports that he checked his sugar level this am and it was 196.  Diarrhea and GI symptoms have improved with stopping metformin, however, patient having muscle cramps and elevated sugar levels on Invokana.    Lab Results  Component Value Date   HGBA1C 7.9 (A) 01/23/2020   HGBA1C 7.2 (H) 11/06/2019   HGBA1C 7.2 (A) 07/31/2019   Wt Readings from Last 3 Encounters:  01/23/20 223 lb (101.2 kg)  01/19/20 222 lb (100.7 kg)  01/13/20 223 lb (101.2 kg)     Patient Active Problem List   Diagnosis Date Noted  . Hallux valgus 07/31/2018  . IBS (irritable bowel syndrome) 01/21/2015  . B12 deficiency 01/07/2015  . Hyperlipidemia 01/07/2015  . Cannot sleep 01/07/2015  . Fatty liver disease, nonalcoholic 0541/58/3094. Osteoarthritis of knee 01/07/2015  . Acne erythematosa 01/07/2015  . Disorder of male genital organs 01/07/2015  . Atypical chest  pain 10/09/2014  . Acid reflux 09/06/2009  . Diabetes mellitus, type 2 ([place]/04/2009  . Colon, diverticulosis 11/20/2008  . BP (high blood pressure) 11/20/2008  . Apnea, sleep 11/20/2008  . History of artificial joint 11/20/2008   Past Medical History:  Diagnosis Date  . Colon polyp 2005  . Diabetes mellitus ([place]  . GERD (gastroesophageal reflux disease)   . Hyperlipidemia   . Hypertension   . Rosacea   . Sleep apnea        Medications: Outpatient Medications Prior to Visit  Medication Sig  . aspirin EC 81 MG tablet Take 81 mg by mouth daily.   . Blood Glucose Monitoring Suppl (ONE TOUCH ULTRA 2) w/Device KIT To check blood sugar once daily  . canagliflozin (INVOKANA) 300 MG TABS tablet Take 1 tablet (300 mg total) by mouth daily before breakfast.  . doxycycline (VIBRAMYCIN) 50 MG capsule PLEASE SEE ATTACHED FOR DETAILED DIRECTIONS  . glipiZIDE (GLUCOTROL) 10 MG tablet TAKE 1 TABLET (10 MG TOTAL) BY MOUTH 2 (TWO) TIMES DAILY BEFORE A MEAL.  . hydrochlorothiazide (HYDRODIURIL) 25 MG tablet TAKE 1 TABLET BY MOUTH EVERY DAY  . Lancets (ACCU-CHEK SOFT TOUCH) lancets To check blood glucose daily  . loratadine-pseudoephedrine (CLARITIN-D 24-HOUR) 10-240 MG per 24 hr tablet Take 1 tablet by mouth daily as needed for allergies.  . Marland Kitchenosartan (COZAAR) 100 MG tablet TAKE 1 TABLET (100 MG TOTAL) BY MOUTH DAILY.  . metoprolol succinate (TOPROL-XL) 25 MG 24 hr  tablet 1 TABLET BY MOUTH EVERY DAY  . metroNIDAZOLE (METROGEL) 1 % gel APPLY TO ROSACEA ONCE DAILY.  Marland Kitchen omeprazole (PRILOSEC) 40 MG capsule TAKE 1 CAPSULE BY MOUTH EVERY DAY  . ONETOUCH ULTRA test strip CHECK BLOOD SUGAR ONCE DAILY  . simvastatin (ZOCOR) 20 MG tablet TAKE 1 TABLET BY MOUTH EVERY DAY IN THE EVENING  . tretinoin (RETIN-A) 0.05 % cream Apply 1 application topically at bedtime.   . vitamin B-12 (CYANOCOBALAMIN) 1000 MCG tablet Take 1,000 mcg by mouth daily.  . potassium chloride (KLOR-CON M10) 10 MEQ tablet Take 1  tablet (10 mEq total) by mouth daily. (Patient not taking: Reported on 01/23/2020)   No facility-administered medications prior to visit.    Review of Systems  Constitutional: Positive for fatigue.  Eyes: Positive for visual disturbance.  Respiratory: Negative for chest tightness, shortness of breath and wheezing.   Cardiovascular: Negative for chest pain, palpitations and leg swelling.  Endocrine: Positive for polyuria.  Neurological: Positive for dizziness, light-headedness and headaches.    Last CBC Lab Results  Component Value Date   WBC 6.5 11/06/2019   HGB 13.5 11/06/2019   HCT 38.2 11/06/2019   MCV 86 11/06/2019   MCH 30.5 11/06/2019   RDW 13.9 11/06/2019   PLT 172 29/52/8413   Last metabolic panel Lab Results  Component Value Date   GLUCOSE 133 (H) 11/06/2019   NA 144 11/06/2019   K 2.9 (L) 11/06/2019   CL 100 11/06/2019   CO2 25 11/06/2019   BUN 17 11/06/2019   CREATININE 1.03 11/06/2019   GFRNONAA 73 11/06/2019   GFRAA 84 11/06/2019   CALCIUM 9.2 11/06/2019   PROT 6.5 11/06/2019   ALBUMIN 4.4 11/06/2019   LABGLOB 2.1 11/06/2019   AGRATIO 2.1 11/06/2019   BILITOT 0.9 11/06/2019   ALKPHOS 58 11/06/2019   AST 29 11/06/2019   ALT 38 11/06/2019      Objective    BP 127/76 (BP Location: Left Arm, Patient Position: Sitting, Cuff Size: Large)   Pulse (!) 59   Temp (!) 97 F (36.1 C) (Temporal)   Resp 16   Wt 223 lb (101.2 kg)   BMI 30.24 kg/m   Orthostatic VS for the past 24 hrs:  BP- Lying Pulse- Lying BP- Sitting Pulse- Sitting BP- Standing at 0 minutes Pulse- Standing at 0 minutes  01/23/20 0821 129/78 60 130/79 62 125/77 68      BP Readings from Last 3 Encounters:  01/23/20 127/76  01/19/20 (!) 102/55  01/13/20 115/73   Wt Readings from Last 3 Encounters:  01/23/20 223 lb (101.2 kg)  01/19/20 222 lb (100.7 kg)  01/13/20 223 lb (101.2 kg)      Physical Exam Vitals reviewed.  Constitutional:      General: He is not in acute  distress.    Appearance: Normal appearance. He is well-developed. He is obese. He is not ill-appearing or diaphoretic.  HENT:     Head: Normocephalic and atraumatic.  Cardiovascular:     Rate and Rhythm: Normal rate and regular rhythm.     Pulses: Normal pulses.     Heart sounds: Normal heart sounds. No murmur heard.  No friction rub. No gallop.   Pulmonary:     Effort: Pulmonary effort is normal. No respiratory distress.     Breath sounds: Normal breath sounds. No wheezing or rales.  Musculoskeletal:     Cervical back: Normal range of motion and neck supple.  Neurological:     Mental Status:  He is alert.       Results for orders placed or performed in visit on 01/23/20  POCT glycosylated hemoglobin (Hb A1C)  Result Value Ref Range   Hemoglobin A1C 7.9 (A) 4.0 - 5.6 %   Est. average glucose Bld gHb Est-mCnc 180     Assessment & Plan     1. Type 2 diabetes mellitus with hyperglycemia, without long-term current use of insulin ([place]) Stop Invokana. Start Trulicity. First injection was demonstrated and given today in the office. Use weekly. Continue Glipizide XR 76m for now. Return in 3 months for A1c recheck. Call if adverse effects occur.  - Dulaglutide (TRULICITY) 09.79MGX/2.1JHSOPN; Inject 0.5 mLs (0.75 mg total) into the skin once a week.  Dispense: 3 mL; Refill: 0  No follow-ups on file.      IReynolds Bowl PA-C, have reviewed all documentation for this visit. The documentation on 01/27/20 for the exam, diagnosis, procedures, and orders are all accurate and complete.    JRubye Beach BMari[phone](phone) [phone](fax)  CBar Nunn

## 2020-01-23 ENCOUNTER — Encounter: Payer: Self-pay | Admitting: Physician Assistant

## 2020-01-23 ENCOUNTER — Ambulatory Visit (INDEPENDENT_AMBULATORY_CARE_PROVIDER_SITE_OTHER): Payer: PPO | Admitting: Physician Assistant

## 2020-01-23 ENCOUNTER — Other Ambulatory Visit: Payer: Self-pay

## 2020-01-23 VITALS — BP 127/76 | HR 59 | Temp 97.0°F | Resp 16 | Wt 223.0 lb

## 2020-01-23 DIAGNOSIS — E1165 Type 2 diabetes mellitus with hyperglycemia: Secondary | ICD-10-CM | POA: Diagnosis not present

## 2020-01-23 LAB — POCT GLYCOSYLATED HEMOGLOBIN (HGB A1C)
Est. average glucose Bld gHb Est-mCnc: 180
Hemoglobin A1C: 7.9 % — AB (ref 4.0–5.6)

## 2020-01-23 MED ORDER — TRULICITY 0.75 MG/0.5ML ~~LOC~~ SOAJ: 0.7500 mg | SUBCUTANEOUS | 0 refills | Status: DC

## 2020-01-23 NOTE — Patient Instructions (Signed)
Coricidin hbp for congestion  Trulicity/Dulaglutide injection What is this medicine? DULAGLUTIDE (DOO la GLOO tide) is used to improve blood sugar control in adults with type 2 diabetes. This medicine may be used with other oral diabetes medicines. This drug may also reduce the risk of heart attack or stroke if you have type 2 diabetes and risk factors for heart disease. This medicine may be used for other purposes; ask your health care provider or pharmacist if you have questions. COMMON BRAND NAME(S): Trulicity What should I tell my health care provider before I take this medicine? They need to know if you have any of these conditions:  endocrine tumors (MEN 2) or if someone in your family had these tumors  eye disease, vision problems  history of pancreatitis  kidney disease  liver disease  stomach problems  thyroid cancer or if someone in your family had thyroid cancer  an unusual or allergic reaction to dulaglutide, other medicines, foods, dyes, or preservatives  pregnant or trying to get pregnant  breast-feeding How should I use this medicine? This medicine is for injection under the skin of your upper leg (thigh), stomach area, or upper arm. It is usually given once every week (every 7 days). You will be taught how to prepare and give this medicine. Use exactly as directed. Take your medicine at regular intervals. Do not take it more often than directed. If you use this medicine with insulin, you should inject this medicine and the insulin separately. Do not mix them together. Do not give the injections right next to each other. Change (rotate) injection sites with each injection. It is important that you put your used needles and syringes in a special sharps container. Do not put them in a trash can. If you do not have a sharps container, call your pharmacist or healthcare provider to get one. A special MedGuide will be given to you by the pharmacist with each prescription  and refill. Be sure to read this information carefully each time. This drug comes with INSTRUCTIONS FOR USE. Ask your pharmacist for directions on how to use this drug. Read the information carefully. Talk to your pharmacist or health care provider if you have questions. Talk to your pediatrician regarding the use of this medicine in children. Special care may be needed. Overdosage: If you think you have taken too much of this medicine contact a poison control center or emergency room at once. NOTE: This medicine is only for you. Do not share this medicine with others. What if I miss a dose? If you miss a dose, take it as soon as you can within 3 days after the missed dose. Then take your next dose at your regular weekly time. If it has been longer than 3 days after the missed dose, do not take the missed dose. Take the next dose at your regular time. Do not take double or extra doses. If you have questions about a missed dose, contact your health care provider for advice. What may interact with this medicine?  other medicines for diabetes Many medications may cause changes in blood sugar, these include:  alcohol containing beverages  antiviral medicines for HIV or AIDS  aspirin and aspirin-like drugs  certain medicines for blood pressure, heart disease, irregular heart beat  chromium  diuretics  male hormones, such as estrogens or progestins, birth control pills  fenofibrate  gemfibrozil  isoniazid  lanreotide  male hormones or anabolic steroids  MAOIs like Carbex, Eldepryl, Marplan, Nardil, and  Parnate  medicines for weight loss  medicines for allergies, asthma, cold, or cough  medicines for depression, anxiety, or psychotic disturbances  niacin  nicotine  NSAIDs, medicines for pain and inflammation, like ibuprofen or naproxen  octreotide  pasireotide  pentamidine  phenytoin  probenecid  quinolone antibiotics such as ciprofloxacin, levofloxacin,  ofloxacin  some herbal dietary supplements  steroid medicines such as prednisone or cortisone  sulfamethoxazole; trimethoprim  thyroid hormones Some medications can hide the warning symptoms of low blood sugar (hypoglycemia). You may need to monitor your blood sugar more closely if you are taking one of these medications. These include:  beta-blockers, often used for high blood pressure or heart problems (examples include atenolol, metoprolol, propranolol)  clonidine  guanethidine  reserpine This list may not describe all possible interactions. Give your health care provider a list of all the medicines, herbs, non-prescription drugs, or dietary supplements you use. Also tell them if you smoke, drink alcohol, or use illegal drugs. Some items may interact with your medicine. What should I watch for while using this medicine? Visit your doctor or health care professional for regular checks on your progress. Drink plenty of fluids while taking this medicine. Check with your doctor or health care professional if you get an attack of severe diarrhea, nausea, and vomiting. The loss of too much body fluid can make it dangerous for you to take this medicine. A test called the HbA1C (A1C) will be monitored. This is a simple blood test. It measures your blood sugar control over the last 2 to 3 months. You will receive this test every 3 to 6 months. Learn how to check your blood sugar. Learn the symptoms of low and high blood sugar and how to manage them. Always carry a quick-source of sugar with you in case you have symptoms of low blood sugar. Examples include hard sugar candy or glucose tablets. Make sure others know that you can choke if you eat or drink when you develop serious symptoms of low blood sugar, such as seizures or unconsciousness. They must get medical help at once. Tell your doctor or health care professional if you have high blood sugar. You might need to change the dose of your  medicine. If you are sick or exercising more than usual, you might need to change the dose of your medicine. Do not skip meals. Ask your doctor or health care professional if you should avoid alcohol. Many nonprescription cough and cold products contain sugar or alcohol. These can affect blood sugar. Pens should never be shared. Even if the needle is changed, sharing may result in passing of viruses like hepatitis or HIV. Wear a medical ID bracelet or chain, and carry a card that describes your disease and details of your medicine and dosage times. What side effects may I notice from receiving this medicine? Side effects that you should report to your doctor or health care professional as soon as possible:  allergic reactions like skin rash, itching or hives, swelling of the face, lips, or tongue  breathing problems  changes in vision  diarrhea that continues or is severe  lump or swelling on the neck  severe nausea  signs and symptoms of infection like fever or chills; cough; sore throat; pain or trouble passing urine  signs and symptoms of low blood sugar such as feeling anxious, confusion, dizziness, increased hunger, unusually weak or tired, sweating, shakiness, cold, irritable, headache, blurred vision, fast heartbeat, loss of consciousness  signs and symptoms of  kidney injury like trouble passing urine or change in the amount of urine  trouble swallowing  unusual stomach upset or pain  vomiting Side effects that usually do not require medical attention (report to your doctor or health care professional if they continue or are bothersome):  diarrhea  loss of appetite  nausea  pain, redness, or irritation at site where injected  stomach upset This list may not describe all possible side effects. Call your doctor for medical advice about side effects. You may report side effects to FDA at 1-800-FDA-1088. Where should I keep my medicine? Keep out of the reach of  children. Store unopened pens in a refrigerator between 2 and 8 degrees C (36 and 46 degrees F). Do not freeze or use if the medicine has been frozen. Protect from light and excessive heat. Store in the carton until use. Each single-dose pen can be kept at room temperature, not to exceed 30 degrees C (86 degrees F) for a total of 14 days, if needed. Throw away any unused medicine after the expiration date on the label. NOTE: This sheet is a summary. It may not cover all possible information. If you have questions about this medicine, talk to your doctor, pharmacist, or health care provider.  2020 Elsevier/Gold Standard (2019-04-15 09:34:53)

## 2020-01-29 ENCOUNTER — Other Ambulatory Visit: Payer: Self-pay | Admitting: Physician Assistant

## 2020-01-29 ENCOUNTER — Ambulatory Visit: Payer: Self-pay | Admitting: Physician Assistant

## 2020-01-29 DIAGNOSIS — E119 Type 2 diabetes mellitus without complications: Secondary | ICD-10-CM

## 2020-01-29 DIAGNOSIS — I10 Essential (primary) hypertension: Secondary | ICD-10-CM

## 2020-01-29 MED ORDER — GLIPIZIDE 10 MG PO TABS: 10.0000 mg | ORAL_TABLET | Freq: Two times a day (BID) | ORAL | 0 refills | Status: DC

## 2020-01-29 MED ORDER — HYDROCHLOROTHIAZIDE 25 MG PO TABS: 25.0000 mg | ORAL_TABLET | Freq: Every day | ORAL | 3 refills | Status: DC

## 2020-02-03 DIAGNOSIS — G4733 Obstructive sleep apnea (adult) (pediatric): Secondary | ICD-10-CM | POA: Diagnosis not present

## 2020-02-04 DIAGNOSIS — G4733 Obstructive sleep apnea (adult) (pediatric): Secondary | ICD-10-CM | POA: Diagnosis not present

## 2020-02-05 ENCOUNTER — Ambulatory Visit: Payer: Self-pay | Admitting: Physician Assistant

## 2020-02-13 ENCOUNTER — Other Ambulatory Visit: Payer: Self-pay | Admitting: Physician Assistant

## 2020-02-13 DIAGNOSIS — E1165 Type 2 diabetes mellitus with hyperglycemia: Secondary | ICD-10-CM

## 2020-02-22 ENCOUNTER — Other Ambulatory Visit: Payer: Self-pay | Admitting: Physician Assistant

## 2020-02-22 DIAGNOSIS — K219 Gastro-esophageal reflux disease without esophagitis: Secondary | ICD-10-CM

## 2020-02-24 DIAGNOSIS — E119 Type 2 diabetes mellitus without complications: Secondary | ICD-10-CM | POA: Diagnosis not present

## 2020-02-24 LAB — HM DIABETES EYE EXAM

## 2020-02-24 NOTE — Progress Notes (Signed)
Established patient visit   Patient: Ivan Kennedy   DOB: 1945-09-14   74 y.o. Male  MRN: [id number] Visit Date: 02/26/2020  Today's healthcare provider: Mar Daring, PA-C   Chief Complaint  Patient presents with  . Hyperglycemia   Subjective    HPI  Patient here with c/o elevated sugars levels. Last office visit he was advised to stop invokana and start Trulicity. Per patient he is having side effect of dizziness when he bends down. He also reports that his fasting sugar levels are 200's.   Patient Active Problem List   Diagnosis Date Noted  . Hallux valgus 07/31/2018  . IBS (irritable bowel syndrome) 01/21/2015  . B12 deficiency 01/07/2015  . Hyperlipidemia 01/07/2015  . Cannot sleep 01/07/2015  . Fatty liver disease, nonalcoholic 72/53/6644  . Osteoarthritis of knee 01/07/2015  . Acne erythematosa 01/07/2015  . Disorder of male genital organs 01/07/2015  . Atypical chest pain 10/09/2014  . Acid reflux 09/06/2009  . Diabetes mellitus, type 2 ([place]) 11/20/2008  . Colon, diverticulosis 11/20/2008  . BP (high blood pressure) 11/20/2008  . Apnea, sleep 11/20/2008  . History of artificial joint 11/20/2008   Past Medical History:  Diagnosis Date  . Colon polyp 2005  . Diabetes mellitus ([place])   . GERD (gastroesophageal reflux disease)   . Hyperlipidemia   . Hypertension   . Rosacea   . Sleep apnea        Medications: Outpatient Medications Prior to Visit  Medication Sig  . aspirin EC 81 MG tablet Take 81 mg by mouth daily.   . Blood Glucose Monitoring Suppl (ONE TOUCH ULTRA 2) w/Device KIT To check blood sugar once daily  . doxycycline (VIBRAMYCIN) 50 MG capsule PLEASE SEE ATTACHED FOR DETAILED DIRECTIONS  . hydrochlorothiazide (HYDRODIURIL) 25 MG tablet Take 1 tablet (25 mg total) by mouth daily.  . Lancets (ACCU-CHEK SOFT TOUCH) lancets To check blood glucose daily  . loratadine-pseudoephedrine (CLARITIN-D 24-HOUR) 10-240 MG per 24 hr tablet Take 1  tablet by mouth daily as needed for allergies.  Marland Kitchen losartan (COZAAR) 100 MG tablet TAKE 1 TABLET (100 MG TOTAL) BY MOUTH DAILY.  . metoprolol succinate (TOPROL-XL) 25 MG 24 hr tablet 1 TABLET BY MOUTH EVERY DAY  . metroNIDAZOLE (METROGEL) 1 % gel APPLY TO ROSACEA ONCE DAILY.  Marland Kitchen omeprazole (PRILOSEC) 40 MG capsule TAKE 1 CAPSULE BY MOUTH EVERY DAY  . ONETOUCH ULTRA test strip CHECK BLOOD SUGAR ONCE DAILY  . potassium chloride (KLOR-CON M10) 10 MEQ tablet Take 1 tablet (10 mEq total) by mouth daily.  . simvastatin (ZOCOR) 20 MG tablet TAKE 1 TABLET BY MOUTH EVERY DAY IN THE EVENING  . tretinoin (RETIN-A) 0.05 % cream Apply 1 application topically at bedtime.   . vitamin B-12 (CYANOCOBALAMIN) 1000 MCG tablet Take 1,000 mcg by mouth daily.  . [DISCONTINUED] glipiZIDE (GLUCOTROL) 10 MG tablet Take 1 tablet (10 mg total) by mouth 2 (two) times daily before a meal.  . [DISCONTINUED] TRULICITY 0.34 VQ/2.5ZD SOPN INJECT 0.5 MLS (0.75 MG TOTAL) INTO THE SKIN ONCE A WEEK.   No facility-administered medications prior to visit.    Review of Systems  Constitutional: Negative.   Respiratory: Negative.   Cardiovascular: Negative.   Gastrointestinal: Negative.   Neurological: Positive for dizziness and light-headedness.    Last CBC Lab Results  Component Value Date   WBC 6.5 11/06/2019   HGB 13.5 11/06/2019   HCT 38.2 11/06/2019   MCV 86 11/06/2019   MCH  30.5 11/06/2019   RDW 13.9 11/06/2019   PLT 172 32/25/6720   Last metabolic panel Lab Results  Component Value Date   GLUCOSE 133 (H) 11/06/2019   NA 144 11/06/2019   K 2.9 (L) 11/06/2019   CL 100 11/06/2019   CO2 25 11/06/2019   BUN 17 11/06/2019   CREATININE 1.03 11/06/2019   GFRNONAA 73 11/06/2019   GFRAA 84 11/06/2019   CALCIUM 9.2 11/06/2019   PROT 6.5 11/06/2019   ALBUMIN 4.4 11/06/2019   LABGLOB 2.1 11/06/2019   AGRATIO 2.1 11/06/2019   BILITOT 0.9 11/06/2019   ALKPHOS 58 11/06/2019   AST 29 11/06/2019   ALT 38 11/06/2019        Objective    BP 125/81 (BP Location: Left Arm, Patient Position: Sitting, Cuff Size: Large)   Pulse 63   Temp (!) 97.1 F (36.2 C) (Temporal)   Resp 16   Wt 227 lb (103 kg)   BMI 30.79 kg/m  BP Readings from Last 3 Encounters:  02/26/20 125/81  01/23/20 127/76  01/19/20 (!) 102/55   Wt Readings from Last 3 Encounters:  02/26/20 227 lb (103 kg)  01/23/20 223 lb (101.2 kg)  01/19/20 222 lb (100.7 kg)     Physical Exam Vitals reviewed.  Constitutional:      General: He is not in acute distress.    Appearance: Normal appearance. He is well-developed. He is obese. He is not ill-appearing or diaphoretic.  HENT:     Head: Normocephalic and atraumatic.  Cardiovascular:     Rate and Rhythm: Normal rate and regular rhythm.     Heart sounds: Normal heart sounds. No murmur heard.  No friction rub. No gallop.   Pulmonary:     Effort: Pulmonary effort is normal. No respiratory distress.     Breath sounds: Normal breath sounds. No wheezing or rales.  Musculoskeletal:     Cervical back: Normal range of motion and neck supple.     Right lower leg: No edema.     Left lower leg: No edema.  Neurological:     Mental Status: He is alert.       No results found for any visits on 02/26/20.  Assessment & Plan     Problem List Items Addressed This Visit      Endocrine   Diabetes mellitus, type 2 ([place]) - Primary    Stop Trulicity as it is causing dizziness. Stop Glipizide. Start Januvia 154m Start Glimepiride 49mF/U in 4 weeks.      Relevant Medications   glimepiride (AMARYL) 4 MG tablet   sitaGLIPtin (JANUVIA) 100 MG tablet      Return in about 4 weeks (around 03/25/2020) for T2DM.      I,Reynolds BowlPA-C, have reviewed all documentation for this visit. The documentation on 02/26/20 for the exam, diagnosis, procedures, and orders are all accurate and complete.   JeRubye BeachBuMendota Mental Hlth [phone]) [phone](fax)  [place]

## 2020-02-26 ENCOUNTER — Encounter: Payer: Self-pay | Admitting: Physician Assistant

## 2020-02-26 ENCOUNTER — Other Ambulatory Visit: Payer: Self-pay

## 2020-02-26 ENCOUNTER — Ambulatory Visit (INDEPENDENT_AMBULATORY_CARE_PROVIDER_SITE_OTHER): Payer: PPO | Admitting: Physician Assistant

## 2020-02-26 VITALS — BP 125/81 | HR 63 | Temp 97.1°F | Resp 16 | Wt 227.0 lb

## 2020-02-26 DIAGNOSIS — E1165 Type 2 diabetes mellitus with hyperglycemia: Secondary | ICD-10-CM | POA: Diagnosis not present

## 2020-02-26 MED ORDER — GLIMEPIRIDE 4 MG PO TABS: 4.0000 mg | ORAL_TABLET | Freq: Every day | ORAL | 3 refills | Status: DC

## 2020-02-26 MED ORDER — SITAGLIPTIN PHOSPHATE 100 MG PO TABS: 100.0000 mg | ORAL_TABLET | Freq: Every day | ORAL | 3 refills | Status: DC

## 2020-02-26 NOTE — Patient Instructions (Addendum)
Stop Glipizide. Start Glimepiride 4mg  daily. Stop Trulicity Start Januvia 100mg .   F/U in 4 weeks.

## 2020-02-26 NOTE — Assessment & Plan Note (Signed)
Stop Trulicity as it is causing dizziness. Stop Glipizide. Start Januvia 100mg  Start Glimepiride 4mg  F/U in 4 weeks.

## 2020-03-03 ENCOUNTER — Other Ambulatory Visit: Payer: Self-pay | Admitting: Physician Assistant

## 2020-03-03 DIAGNOSIS — E119 Type 2 diabetes mellitus without complications: Secondary | ICD-10-CM

## 2020-03-04 DIAGNOSIS — G4733 Obstructive sleep apnea (adult) (pediatric): Secondary | ICD-10-CM | POA: Diagnosis not present

## 2020-03-23 NOTE — Progress Notes (Signed)
Established patient visit   Patient: Ivan Kennedy   DOB: 1946-06-27   74 y.o. Male  MRN: [id number] Visit Date: 03/25/2020  Today's healthcare provider: Mar Daring, PA-C   Chief Complaint  Patient presents with  . Follow-up   Subjective    HPI  T2DM - Checking BG at home: 220-250 fasting - Medications:Januvia 178m andGlimepiride 440m- Compliance: Good - Diet: Regular - Exercise: some walking - denies symptoms of hypoglycemia, polyuria, polydipsia, numbness extremities, foot ulcers/trauma Last A1C:  Lab Results  Component Value Date   HGBA1C 7.7 (A) 03/25/2020   HGBA1C 7.9 (A) 01/23/2020   HGBA1C 7.2 (H) 11/06/2019    Patient Active Problem List   Diagnosis Date Noted  . Hallux valgus 07/31/2018  . IBS (irritable bowel syndrome) 01/21/2015  . B12 deficiency 01/07/2015  . Hyperlipidemia 01/07/2015  . Cannot sleep 01/07/2015  . Fatty liver disease, nonalcoholic 0583/04/4075. Osteoarthritis of knee 01/07/2015  . Acne erythematosa 01/07/2015  . Disorder of male genital organs 01/07/2015  . Atypical chest pain 10/09/2014  . Acid reflux 09/06/2009  . [place]/04[place]  . Colon, diverticulosis 11/20/2008  . BP (high blood pressure) 11/20/2008  . Apnea, sleep 11/20/2008  . History of artificial joint 11/20/2008   Past Medical History:  Diagnosis Date  . Colon polyp 2005  . Diabetes mellitus ([place]  . GERD (gastroesophageal reflux disease)   . Hyperlipidemia   . Hypertension   . Rosacea   . Sleep apnea        Medications: Outpatient Medications Prior to Visit  Medication Sig  . aspirin EC 81 MG tablet Take 81 mg by mouth daily.   . Marland Kitchenoxycycline (VIBRAMYCIN) 50 MG capsule PLEASE SEE ATTACHED FOR DETAILED DIRECTIONS  . glimepiride (AMARYL) 4 MG tablet Take 1 tablet (4 mg total) by mouth daily before breakfast.  . hydrochlorothiazide (HYDRODIURIL) 25 MG tablet Take 1 tablet (25 mg total) by mouth daily.  .  Lancets (ACCU-CHEK SOFT TOUCH) lancets To check blood glucose daily  . loratadine-pseudoephedrine (CLARITIN-D 24-HOUR) 10-240 MG per 24 hr tablet Take 1 tablet by mouth daily as needed for allergies.  . Marland Kitchenosartan (COZAAR) 100 MG tablet TAKE 1 TABLET (100 MG TOTAL) BY MOUTH DAILY.  . metoprolol succinate (TOPROL-XL) 25 MG 24 hr tablet 1 TABLET BY MOUTH EVERY DAY  . metroNIDAZOLE (METROGEL) 1 % gel APPLY TO ROSACEA ONCE DAILY.  . Marland Kitchenmeprazole (PRILOSEC) 40 MG capsule TAKE 1 CAPSULE BY MOUTH EVERY DAY  . ONETOUCH ULTRA test strip CHECK BLOOD SUGAR ONCE DAILY  . potassium chloride (KLOR-CON M10) 10 MEQ tablet Take 1 tablet (10 mEq total) by mouth daily.  . simvastatin (ZOCOR) 20 MG tablet TAKE 1 TABLET BY MOUTH EVERY DAY IN THE EVENING  . sitaGLIPtin (JANUVIA) 100 MG tablet Take 1 tablet (100 mg total) by mouth daily.  . Marland Kitchenretinoin (RETIN-A) 0.05 % cream Apply 1 application topically at bedtime.   . vitamin B-12 (CYANOCOBALAMIN) 1000 MCG tablet Take 1,000 mcg by mouth daily.  . [DISCONTINUED] Blood Glucose Monitoring Suppl (ONE TOUCH ULTRA 2) w/Device KIT To check blood sugar once daily   No facility-administered medications prior to visit.    Review of Systems  Constitutional: Negative for fatigue.  Cardiovascular: Negative for chest pain, palpitations and leg swelling.  Neurological: Negative for dizziness and headaches.    Last CBC Lab Results  Component Value Date  WBC 6.5 11/06/2019   HGB 13.5 11/06/2019   HCT 38.2 11/06/2019   MCV 86 11/06/2019   MCH 30.5 11/06/2019   RDW 13.9 11/06/2019   PLT 172 82/50/0370   Last metabolic panel Lab Results  Component Value Date   GLUCOSE 133 (H) 11/06/2019   NA 144 11/06/2019   K 2.9 (L) 11/06/2019   CL 100 11/06/2019   CO2 25 11/06/2019   BUN 17 11/06/2019   CREATININE 1.03 11/06/2019   GFRNONAA 73 11/06/2019   GFRAA 84 11/06/2019   CALCIUM 9.2 11/06/2019   PROT 6.5 11/06/2019   ALBUMIN 4.4 11/06/2019   LABGLOB 2.1 11/06/2019    AGRATIO 2.1 11/06/2019   BILITOT 0.9 11/06/2019   ALKPHOS 58 11/06/2019   AST 29 11/06/2019   ALT 38 11/06/2019      Objective    BP 114/72 (BP Location: Left Arm, Patient Position: Sitting, Cuff Size: Large)   Pulse 62   Temp 97.6 F (36.4 C) (Oral)   Resp 16   Wt 227 lb 9.6 oz (103.2 kg)   BMI 30.87 kg/m  BP Readings from Last 3 Encounters:  03/25/20 114/72  02/26/20 125/81  01/23/20 127/76   Wt Readings from Last 3 Encounters:  03/25/20 227 lb 9.6 oz (103.2 kg)  02/26/20 227 lb (103 kg)  01/23/20 223 lb (101.2 kg)      Physical Exam Vitals reviewed.  Constitutional:      General: He is not in acute distress.    Appearance: Normal appearance. He is well-developed. He is obese. He is not ill-appearing or diaphoretic.  HENT:     Head: Normocephalic and atraumatic.  Neck:     Thyroid: No thyromegaly.     Vascular: No JVD.     Trachea: No tracheal deviation.   Cardiovascular:     Rate and Rhythm: Normal rate and regular rhythm.     Heart sounds: Normal heart sounds. No murmur heard.  No friction rub. No gallop.   Pulmonary:     Effort: Pulmonary effort is normal. No respiratory distress.     Breath sounds: Normal breath sounds. No wheezing or rales.  Musculoskeletal:     Cervical back: Neck supple. Tenderness present. Muscular tenderness present. No pain with movement (does have movement stiffness to left) or spinous process tenderness. Decreased range of motion.  Lymphadenopathy:     Cervical: No cervical adenopathy.  Neurological:     Mental Status: He is alert.       Results for orders placed or performed in visit on 03/25/20  POCT glycosylated hemoglobin (Hb A1C)  Result Value Ref Range   Hemoglobin A1C 7.7 (A) 4.0 - 5.6 %   Est. average glucose Bld gHb Est-mCnc 174     Assessment & Plan     1. Type 2 diabetes mellitus with hyperglycemia, without long-term current use of insulin (HCC) A1c improving slightly but still not back to goal after  stopping metformin. 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 F/U in 3 months.  - pioglitazone (ACTOS) 45 MG tablet; Take 1 tablet (45 mg total) by mouth daily.  Dispense: 90 tablet; Refill: 0 - Blood Glucose Monitoring Suppl (ONE TOUCH ULTRA 2) w/Device KIT; To check blood sugar once daily  Dispense: 1 kit; Refill: 0  2. Neck muscle spasm Noted in left upper trapezius. Start Flexeril q hs as below. May apply moist heat.  Discussed massage therapy.  - cyclobenzaprine (FLEXERIL) 5 MG tablet; Take 1 tablet (5 mg total) by mouth at bedtime.  Dispense: 30 tablet; Refill: 1   Return in about 3 months (around 06/25/2020).      Reynolds Bowl, PA-C, have reviewed all documentation for this visit. The documentation on 03/25/20 for the exam, diagnosis, procedures, and orders are all accurate and complete.   Rubye Beach  [facility] [phone] (phone) [phone] (fax)  [place]

## 2020-03-25 ENCOUNTER — Other Ambulatory Visit: Payer: Self-pay

## 2020-03-25 ENCOUNTER — Ambulatory Visit (INDEPENDENT_AMBULATORY_CARE_PROVIDER_SITE_OTHER): Payer: PPO | Admitting: Physician Assistant

## 2020-03-25 ENCOUNTER — Encounter: Payer: Self-pay | Admitting: Physician Assistant

## 2020-03-25 VITALS — BP 114/72 | HR 62 | Temp 97.6°F | Resp 16 | Wt 227.6 lb

## 2020-03-25 DIAGNOSIS — M62838 Other muscle spasm: Secondary | ICD-10-CM | POA: Diagnosis not present

## 2020-03-25 DIAGNOSIS — E1165 Type 2 diabetes mellitus with hyperglycemia: Secondary | ICD-10-CM

## 2020-03-25 LAB — POCT GLYCOSYLATED HEMOGLOBIN (HGB A1C)
Est. average glucose Bld gHb Est-mCnc: 174
Hemoglobin A1C: 7.7 % — AB (ref 4.0–5.6)

## 2020-03-25 MED ORDER — CYCLOBENZAPRINE HCL 5 MG PO TABS: 5.0000 mg | ORAL_TABLET | Freq: Every day | ORAL | 1 refills | Status: DC

## 2020-03-25 MED ORDER — ONETOUCH ULTRA 2 W/DEVICE KIT: PACK | 0 refills | Status: AC

## 2020-03-25 MED ORDER — PIOGLITAZONE HCL 45 MG PO TABS: 45.0000 mg | ORAL_TABLET | Freq: Every day | ORAL | 0 refills | Status: DC

## 2020-03-25 NOTE — Patient Instructions (Signed)
Acute Torticollis, Adult Torticollis is a condition in which the muscles of the neck tighten (contract) abnormally, causing the neck to twist and the head to move into an unnatural position. Torticollis that develops suddenly is called acute torticollis. People with acute torticollis may have trouble turning their head. The condition can be painful and may range from mild to severe. What are the causes? This condition may be caused by:  Sleeping in an awkward position (common).  Extending or twisting the neck muscles beyond their normal position.  An injury to the neck muscles.  An infection.  A tumor.  Certain medicines.  Long-lasting spasms of the neck muscles. In some cases, the cause may not be known. What increases the risk? You are more likely to develop this condition if:  You have a condition associated with loose ligaments, such as Down syndrome.  You have a brain condition that affects vision, such as strabismus. What are the signs or symptoms? The main symptom of this condition is tilting of the head to one side. Other symptoms include:  Pain in the neck.  Trouble turning the head from side to side or up and down. How is this diagnosed? This condition may be diagnosed based on:  A physical exam.  Your medical history.  Imaging tests, such as: ? An X-ray. ? An ultrasound. ? A CT scan. ? An MRI. How is this treated? Treatment for this condition depends on what is causing the condition. Mild cases may go away without treatment. Treatment for more serious cases may include:  Medicines or shots to relax the muscles.  Other medicines, such as antibiotics to treat the underlying cause.  Wearing a soft neck collar.  Physical therapy and stretching to improve neck strength and flexibility.  Neck massage. In severe cases, surgery may be needed to repair dislocated or broken bones or to treat nerves in the neck. Follow these instructions at home:   Take  over-the-counter and prescription medicines only as told by your health care provider.  Do stretching exercises and massage your neck as told by your health care provider.  If directed, apply heat to the affected area as often as told by your health care provider. Use the heat source that your health care provider recommends, such as a moist heat pack or a heating pad. ? Place a towel between your skin and the heat source. ? Leave the heat on for 20-30 minutes. ? Remove the heat if your skin turns bright red. This is especially important if you are unable to feel pain, heat, or cold. You may have a greater risk of getting burned.  If you wake up with torticollis after sleeping, check your bed or sleeping area. Look for lumpy pillows or unusual objects. Make sure your bed and sleeping area are comfortable.  Keep all follow-up visits as told by your health care provider. This is important. Contact a health care provider if:  You have a fever.  Your symptoms do not improve or they get worse. Get help right away if:  You have trouble breathing.  You develop noisy breathing (stridor).  You start to drool.  You have trouble swallowing or pain when swallowing.  You develop numbness or weakness in your hands or feet.  You have changes in your speech, understanding, or vision.  You are in severe pain.  You cannot move your head or neck. Summary  Torticollis is a condition in which the muscles of the neck tighten (contract) abnormally, causing   the neck to twist and the head to move into an unnatural position. Torticollis that develops suddenly is called acute torticollis.  Treatment for this condition depends on what is causing the condition. Mild cases may go away without treatment.  Do stretching exercises and massage your neck as told by your health care provider. You may also be instructed to apply heat to the area.  Contact your health care provider if your symptoms do not  improve or they get worse. This information is not intended to replace advice given to you by your health care provider. Make sure you discuss any questions you have with your health care provider. Document Revised: 07/13/2017 Document Reviewed: 09/28/2016 Elsevier Patient Education  [place].   Neck Exercises Ask your health care provider which exercises are safe for you. Do exercises exactly as told by your health care provider and adjust them as directed. It is normal to feel mild stretching, pulling, tightness, or discomfort as you do these exercises. Stop right away if you feel sudden pain or your pain gets worse. Do not begin these exercises until told by your health care provider. Neck exercises can be important for many reasons. They can improve strength and maintain flexibility in your neck, which will help your upper back and prevent neck pain. Stretching exercises Rotation neck stretching  1. Sit in a chair or stand up. 2. Place your feet flat on the floor, shoulder width apart. 3. Slowly turn your head (rotate) to the right until a slight stretch is felt. Turn it all the way to the right so you can look over your right shoulder. Do not tilt or tip your head. 4. Hold this position for 10-30 seconds. 5. Slowly turn your head (rotate) to the left until a slight stretch is felt. Turn it all the way to the left so you can look over your left shoulder. Do not tilt or tip your head. 6. Hold this position for 10-30 seconds. Repeat __________ times. Complete this exercise __________ times a day. Neck retraction 1. Sit in a sturdy chair or stand up. 2. Look straight ahead. Do not bend your neck. 3. Use your fingers to push your chin backward (retraction). Do not bend your neck for this movement. Continue to face straight ahead. If you are doing the exercise properly, you will feel a slight sensation in your throat and a stretch at the back of your neck. 4. Hold the stretch for 1-2  seconds. Repeat __________ times. Complete this exercise __________ times a day. Strengthening exercises Neck press 1. Lie on your back on a firm bed or on the floor with a pillow under your head. 2. Use your neck muscles to push your head down on the pillow and straighten your spine. 3. Hold the position as well as you can. Keep your head facing up (in a neutral position) and your chin tucked. 4. Slowly count to 5 while holding this position. Repeat __________ times. Complete this exercise __________ times a day. Isometrics These are exercises in which you strengthen the muscles in your neck while keeping your neck still (isometrics). 1. Sit in a supportive chair and place your hand on your forehead. 2. Keep your head and face facing straight ahead. Do not flex or extend your neck while doing isometrics. 3. Push forward with your head and neck while pushing back with your hand. Hold for 10 seconds. 4. Do the sequence again, this time putting your hand against the back of your  head. Use your head and neck to push backward against the hand pressure. 5. Finally, do the same exercise on either side of your head, pushing sideways against the pressure of your hand. Repeat __________ times. Complete this exercise __________ times a day. Prone head lifts 1. Lie face-down (prone position), resting on your elbows so that your chest and upper back are raised. 2. Start with your head facing downward, near your chest. Position your chin either on or near your chest. 3. Slowly lift your head upward. Lift until you are looking straight ahead. Then continue lifting your head as far back as you can comfortably stretch. 4. Hold your head up for 5 seconds. Then slowly lower it to your starting position. Repeat __________ times. Complete this exercise __________ times a day. Supine head lifts 1. Lie on your back (supine position), bending your knees to point to the ceiling and keeping your feet flat on the  floor. 2. Lift your head slowly off the floor, raising your chin toward your chest. 3. Hold for 5 seconds. Repeat __________ times. Complete this exercise __________ times a day. Scapular retraction 1. Stand with your arms at your sides. Look straight ahead. 2. Slowly pull both shoulders (scapulae) backward and downward (retraction) until you feel a stretch between your shoulder blades in your upper back. 3. Hold for 10-30 seconds. 4. Relax and repeat. Repeat __________ times. Complete this exercise __________ times a day. Contact a health care provider if:  Your neck pain or discomfort gets much worse when you do an exercise.  Your neck pain or discomfort does not improve within 2 hours after you exercise. If you have any of these problems, stop exercising right away. Do not do the exercises again unless your health care provider says that you can. Get help right away if:  You develop sudden, severe neck pain. If this happens, stop exercising right away. Do not do the exercises again unless your health care provider says that you can. This information is not intended to replace advice given to you by your health care provider. Make sure you discuss any questions you have with your health care provider. Document Revised: 05/29/2018 Document Reviewed: 05/29/2018 Elsevier Patient Education  [place].

## 2020-03-30 DIAGNOSIS — G4733 Obstructive sleep apnea (adult) (pediatric): Secondary | ICD-10-CM | POA: Diagnosis not present

## 2020-03-31 ENCOUNTER — Other Ambulatory Visit: Payer: Self-pay | Admitting: Physician Assistant

## 2020-03-31 DIAGNOSIS — E7800 Pure hypercholesterolemia, unspecified: Secondary | ICD-10-CM

## 2020-03-31 NOTE — Telephone Encounter (Signed)
Requested Prescriptions  Pending Prescriptions Disp Refills  . simvastatin (ZOCOR) 20 MG tablet [Pharmacy Med Name: SIMVASTATIN 20 MG TABLET] 90 tablet 2    Sig: TAKE 1 TABLET BY MOUTH EVERY DAY IN THE EVENING     Cardiovascular:  Antilipid - Statins Failed - 03/31/2020  1:16 AM      Failed - LDL in normal range and within 360 days    LDL Cholesterol (Calc)  Date Value Ref Range Status  04/26/2017 38 mg/dL (calc) Final    Comment:    Reference range: <100 . Desirable range <100 mg/dL for primary prevention;   <70 mg/dL for patients with CHD or diabetic patients  with > or = 2 CHD risk factors. Marland Kitchen LDL-C is now calculated using the Martin-Hopkins  calculation, which is a validated novel method providing  better accuracy than the Friedewald equation in the  estimation of LDL-C.  Cresenciano Genre et al. Annamaria Helling. 8366;294(76): 2061-2068  (http://education.Quest[employer].com/faq/FAQ164)    LDL Chol Calc (NIH)  Date Value Ref Range Status  11/06/2019 39 0 - 99 mg/dL Final         Failed - HDL in normal range and within 360 days    HDL  Date Value Ref Range Status  11/06/2019 38 (L) >39 mg/dL Final         Failed - Triglycerides in normal range and within 360 days    Triglycerides  Date Value Ref Range Status  11/06/2019 154 (H) 0 - 149 mg/dL Final         Passed - Total Cholesterol in normal range and within 360 days    Cholesterol, Total  Date Value Ref Range Status  11/06/2019 103 100 - 199 mg/dL Final         Passed - Patient is not pregnant      Passed - Valid encounter within last 12 months    Recent Outpatient Visits          6 days ago Type 2 diabetes mellitus with hyperglycemia, without long-term current use of insulin [facility])   [place], [place], PA-C   1 month ago Type 2 diabetes mellitus with hyperglycemia, without long-term current use of insulin [facility])   [place], [place], [place]   2 months ago Type 2  diabetes mellitus with hyperglycemia, without long-term current use of insulin [facility])   [place], [place], [place]   4 months ago Annual physical exam   [place], [place]   6 months ago Irritable bowel syndrome with diarrhea   [place], Clearnce Sorrel, [place]      Future Appointments            In 3 months Burnette, Clearnce Sorrel, PA-C [employer], [facility]

## 2020-04-01 DIAGNOSIS — L578 Other skin changes due to chronic exposure to nonionizing radiation: Secondary | ICD-10-CM | POA: Diagnosis not present

## 2020-04-01 DIAGNOSIS — C4441 Basal cell carcinoma of skin of scalp and neck: Secondary | ICD-10-CM | POA: Diagnosis not present

## 2020-04-01 DIAGNOSIS — Z85828 Personal history of other malignant neoplasm of skin: Secondary | ICD-10-CM | POA: Diagnosis not present

## 2020-04-01 DIAGNOSIS — L718 Other rosacea: Secondary | ICD-10-CM | POA: Diagnosis not present

## 2020-04-01 DIAGNOSIS — L720 Epidermal cyst: Secondary | ICD-10-CM | POA: Diagnosis not present

## 2020-04-01 DIAGNOSIS — Z859 Personal history of malignant neoplasm, unspecified: Secondary | ICD-10-CM | POA: Diagnosis not present

## 2020-04-01 DIAGNOSIS — Z872 Personal history of diseases of the skin and subcutaneous tissue: Secondary | ICD-10-CM | POA: Diagnosis not present

## 2020-04-01 DIAGNOSIS — L570 Actinic keratosis: Secondary | ICD-10-CM | POA: Diagnosis not present

## 2020-04-01 DIAGNOSIS — D485 Neoplasm of uncertain behavior of skin: Secondary | ICD-10-CM | POA: Diagnosis not present

## 2020-04-22 ENCOUNTER — Other Ambulatory Visit: Payer: Self-pay | Admitting: Physician Assistant

## 2020-04-22 DIAGNOSIS — E119 Type 2 diabetes mellitus without complications: Secondary | ICD-10-CM

## 2020-05-04 DIAGNOSIS — C4491 Basal cell carcinoma of skin, unspecified: Secondary | ICD-10-CM | POA: Diagnosis not present

## 2020-05-04 DIAGNOSIS — D224 Melanocytic nevi of scalp and neck: Secondary | ICD-10-CM | POA: Diagnosis not present

## 2020-05-05 DIAGNOSIS — G4733 Obstructive sleep apnea (adult) (pediatric): Secondary | ICD-10-CM | POA: Diagnosis not present

## 2020-05-19 ENCOUNTER — Other Ambulatory Visit: Payer: Self-pay | Admitting: Physician Assistant

## 2020-05-19 DIAGNOSIS — K219 Gastro-esophageal reflux disease without esophagitis: Secondary | ICD-10-CM

## 2020-05-19 NOTE — Telephone Encounter (Signed)
Requested Prescriptions  Pending Prescriptions Disp Refills   omeprazole (PRILOSEC) 40 MG capsule [Pharmacy Med Name: OMEPRAZOLE DR 40 MG CAPSULE] 90 capsule 0    Sig: TAKE 1 CAPSULE BY MOUTH EVERY DAY     Gastroenterology: Proton Pump Inhibitors Passed - 05/19/2020  1:18 AM      Passed - Valid encounter within last 12 months    Recent Outpatient Visits          1 month ago Type 2 diabetes mellitus with hyperglycemia, without long-term current use of insulin [facility])   [facility] [place], [place], PA-C   2 months ago Type 2 diabetes mellitus with hyperglycemia, without long-term current use of insulin [facility])   [place], [place], [place]   3 months ago Type 2 diabetes mellitus with hyperglycemia, without long-term current use of insulin [facility])   [facility] [place], Clearnce Sorrel, [place]   6 months ago Annual physical exam   [place], [place]   8 months ago Irritable bowel syndrome with diarrhea   [place], Clearnce Sorrel, [place]      Future Appointments            In 1 month Burnette, Clearnce Sorrel, PA-C [employer], [place]

## 2020-05-20 ENCOUNTER — Other Ambulatory Visit: Payer: Self-pay | Admitting: Physician Assistant

## 2020-05-20 DIAGNOSIS — E1165 Type 2 diabetes mellitus with hyperglycemia: Secondary | ICD-10-CM

## 2020-05-20 NOTE — Telephone Encounter (Signed)
Approved per protocol. Next appointment 07/01/20.

## 2020-05-25 ENCOUNTER — Telehealth: Payer: Self-pay | Admitting: *Deleted

## 2020-05-25 NOTE — Chronic Care Management (AMB) (Signed)
  Chronic Care Management   Note  05/25/2020 Name: SEFERINO OSCAR MRN: [id number] DOB: 1945-10-14  CHRSTOPHER MALENFANT is a 74 y.o. year old male who is a primary care patient of Mar [place], [place]. I reached out to Soyla Dryer by phone today in response to a referral sent by Mr. Olanda Downie Barb's health plan.     Mr. Viner was given information about Chronic Care Management services today including:  1. CCM service includes personalized support from designated clinical staff supervised by his physician, including individualized plan of care and coordination with other care providers 2. 24/7 contact phone numbers for assistance for urgent and routine care needs. 3. Service will only be billed when office clinical staff spend 20 minutes or more in a month to coordinate care. 4. Only one practitioner may furnish and bill the service in a calendar month. 5. The patient may stop CCM services at any time (effective at the end of the month) by phone call to the office staff. 6. The patient will be responsible for cost sharing (co-pay) of up to 20% of the service fee (after annual deductible is met).  Patient wishes to consider information provided and/or speak with a member of the care team before deciding about enrollment in care management services.   Follow up plan: The patient has been provided with contact information for the care management team and has been advised to call with any health related questions or concerns.   [place] Management  Direct Dial: [phone]

## 2020-05-25 NOTE — Chronic Care Management (AMB) (Signed)
  Chronic Care Management   Outreach Note  05/25/2020 Name: Ivan Kennedy MRN: [id number] DOB: [birth year]  Ivan Kennedy is a 74 y.o. year old male who is a primary care patient of [place]. I reached out to Soyla Dryer by phone today in response to a referral sent by [name]'s health plan.     An unsuccessful telephone outreach was attempted today. The patient was referred to the case management team for assistance with care management and care coordination.   Follow Up Plan: A HIPAA compliant phone message was left for the patient providing contact information and requesting a return call. The care management team will reach out to the patient again over the next 7 days. If patient returns call to provider office, please advise to call [place] at [phone].  [place] Management

## 2020-06-04 DIAGNOSIS — G4733 Obstructive sleep apnea (adult) (pediatric): Secondary | ICD-10-CM | POA: Diagnosis not present

## 2020-06-06 ENCOUNTER — Other Ambulatory Visit: Payer: Self-pay | Admitting: Physician Assistant

## 2020-06-06 DIAGNOSIS — I10 Essential (primary) hypertension: Secondary | ICD-10-CM

## 2020-06-06 NOTE — Telephone Encounter (Signed)
Requested Prescriptions  Pending Prescriptions Disp Refills  . losartan (COZAAR) 100 MG tablet [Pharmacy Med Name: LOSARTAN POTASSIUM 100 MG TAB] 90 tablet 1    Sig: TAKE 1 TABLET (100 MG TOTAL) BY MOUTH DAILY.     Cardiovascular:  Angiotensin Receptor Blockers Failed - 06/06/2020 12:43 AM      Failed - Cr in normal range and within 180 days    Creat  Date Value Ref Range Status  04/26/2017 1.00 0.70 - 1.25 mg/dL Final    Comment:    For patients >52 years of age, the reference limit for Creatinine is approximately 13% higher for people identified as African-American. .    Creatinine, Ser  Date Value Ref Range Status  11/06/2019 1.03 0.76 - 1.27 mg/dL Final         Failed - K in normal range and within 180 days    Potassium  Date Value Ref Range Status  11/06/2019 2.9 (L) 3.5 - 5.2 mmol/L Final         Passed - Patient is not pregnant      Passed - Last BP in normal range    BP Readings from Last 1 Encounters:  03/25/20 114/72         Passed - Valid encounter within last 6 months    Recent Outpatient Visits          2 months ago Type 2 diabetes mellitus with hyperglycemia, without long-term current use of insulin [facility])   [facility] [place], [place], PA-C   3 months ago Type 2 diabetes mellitus with hyperglycemia, without long-term current use of insulin [facility])   [place], [place], PA-C   4 months ago Type 2 diabetes mellitus with hyperglycemia, without long-term current use of insulin [facility])   [facility] [place], Clearnce Sorrel, [place]   7 months ago Annual physical exam   [facility] [name], [place]   9 months ago Irritable bowel syndrome with diarrhea   [facility] [place], Clearnce Sorrel, [place]      Future Appointments            In 2 weeks Burnette, Clearnce Sorrel, PA-C [employer], [facility]           . metoprolol succinate (TOPROL-XL) 25 MG 24 hr tablet  [Pharmacy Med Name: METOPROLOL SUCC ER 25 MG TAB] 90 tablet 1    Sig: 1 TABLET BY MOUTH EVERY DAY     Cardiovascular:  Beta Blockers Passed - 06/06/2020 12:43 AM      Passed - Last BP in normal range    BP Readings from Last 1 Encounters:  03/25/20 114/72         Passed - Last Heart Rate in normal range    Pulse Readings from Last 1 Encounters:  03/25/20 62         Passed - Valid encounter within last 6 months    Recent Outpatient Visits          2 months ago Type 2 diabetes mellitus with hyperglycemia, without long-term current use of insulin [facility])   [place], [place], PA-C   3 months ago Type 2 diabetes mellitus with hyperglycemia, without long-term current use of insulin [facility])   [place], [place], [place]   4 months ago Type 2 diabetes mellitus with hyperglycemia, without long-term current use of insulin [facility])   [facility] [place], [place], [place]  7 months ago Annual physical exam   [place], [place]   9 months ago Irritable bowel syndrome with diarrhea   [place], [place]      Future Appointments            In 2 weeks Marlyn Corporal, Clearnce Sorrel, PA-C [employer], [place]

## 2020-06-18 ENCOUNTER — Other Ambulatory Visit: Payer: Self-pay | Admitting: Physician Assistant

## 2020-06-18 DIAGNOSIS — E1165 Type 2 diabetes mellitus with hyperglycemia: Secondary | ICD-10-CM

## 2020-06-18 DIAGNOSIS — M62838 Other muscle spasm: Secondary | ICD-10-CM

## 2020-06-18 NOTE — Telephone Encounter (Signed)
Requested medication (s) are due for refill today: yes   Requested medication (s) are on the active medication list: yes   Last refill:  04/22/2020  Future visit scheduled: yes   Notes to clinic:  this refill cannot be delegated    Requested Prescriptions  Pending Prescriptions Disp Refills   cyclobenzaprine (FLEXERIL) 5 MG tablet [Pharmacy Med Name: CYCLOBENZAPRINE 5 MG TABLET] 30 tablet 1    Sig: TAKE 1 TABLET BY MOUTH EVERYDAY AT BEDTIME      Not Delegated - Analgesics:  Muscle Relaxants Failed - 06/18/2020  8:53 AM      Failed - This refill cannot be delegated      Passed - Valid encounter within last 6 months    Recent Outpatient Visits           2 months ago Type 2 diabetes mellitus with hyperglycemia, without long-term current use of insulin [facility])   [facility] [place], [place], PA-C   3 months ago Type 2 diabetes mellitus with hyperglycemia, without long-term current use of insulin [facility])   [place], [place], [place]   4 months ago Type 2 diabetes mellitus with hyperglycemia, without long-term current use of insulin [facility])   [place], Clearnce Sorrel, [place]   7 months ago Annual physical exam   [facility] [name], [place]   9 months ago Irritable bowel syndrome with diarrhea   [facility] [place], Clearnce Sorrel, PA-C       Future Appointments             In 5 days Marlyn Corporal, Clearnce Sorrel, PA-C [employer], [facility]             Signed Prescriptions Disp Refills   glimepiride (AMARYL) 4 MG tablet 90 tablet 0    Sig: TAKE 1 TABLET (4 MG TOTAL) BY MOUTH DAILY BEFORE BREAKFAST.      Endocrinology:  Diabetes - Sulfonylureas Passed - 06/18/2020  8:53 AM      Passed - HBA1C is between 0 and 7.9 and within 180 days    Hemoglobin A1C  Date Value Ref Range Status  03/25/2020 7.7 (A) 4.0 - 5.6 % Final   Hgb A1c MFr Bld  Date Value Ref Range Status   11/06/2019 7.2 (H) 4.8 - 5.6 % Final    Comment:             Prediabetes: 5.7 - 6.4          Diabetes: >6.4          Glycemic control for adults with diabetes: <7.0           Passed - Valid encounter within last 6 months    Recent Outpatient Visits           2 months ago Type 2 diabetes mellitus with hyperglycemia, without long-term current use of insulin [facility])   [facility] [place], [place], PA-C   3 months ago Type 2 diabetes mellitus with hyperglycemia, without long-term current use of insulin [facility])   [place], [place]   4 months ago Type 2 diabetes mellitus with hyperglycemia, without long-term current use of insulin [facility])   [facility] [place], [place], [place]   7 months ago Annual physical exam   [place], [place]   9 months ago Irritable bowel syndrome with diarrhea   [facility], [place], [place]  Future Appointments             In 5 days Burnette, Clearnce Sorrel, PA-C [place]              pioglitazone (ACTOS) 45 MG tablet 90 tablet 0    Sig: TAKE 1 TABLET BY MOUTH EVERY DAY      Endocrinology:  Diabetes - Glitazones - pioglitazone Passed - 06/18/2020  8:53 AM      Passed - HBA1C is between 0 and 7.9 and within 180 days    Hemoglobin A1C  Date Value Ref Range Status  03/25/2020 7.7 (A) 4.0 - 5.6 % Final   Hgb A1c MFr Bld  Date Value Ref Range Status  11/06/2019 7.2 (H) 4.8 - 5.6 % Final    Comment:             Prediabetes: 5.7 - 6.4          Diabetes: >6.4          Glycemic control for adults with diabetes: <7.0           Passed - Valid encounter within last 6 months    Recent Outpatient Visits           2 months ago Type 2 diabetes mellitus with hyperglycemia, without long-term current use of insulin [facility])   [facility] [place], [place], PA-C   3 months ago Type 2  diabetes mellitus with hyperglycemia, without long-term current use of insulin [facility])   [place], [place]   4 months ago Type 2 diabetes mellitus with hyperglycemia, without long-term current use of insulin [facility])   [facility] [place], [place], [place]   7 months ago Annual physical exam   [place], [place]   9 months ago Irritable bowel syndrome with diarrhea   [facility] [place], Clearnce Sorrel, [place]       Future Appointments             In 5 days Burnette, Clearnce Sorrel, PA-C [employer], [facility]

## 2020-06-21 NOTE — Progress Notes (Signed)
Established patient visit   Patient: Ivan Kennedy   DOB: 10/12/1945   74 y.o. Male  MRN: [id number] Visit Date: 06/23/2020  Today's healthcare provider: Mar Daring, PA-C   Chief Complaint  Patient presents with  . Follow-up   Subjective    HPI  Diabetes Mellitus Type II, Follow-up  Lab Results  Component Value Date   HGBA1C 8.6 (A) 06/23/2020   HGBA1C 7.7 (A) 03/25/2020   HGBA1C 7.9 (A) 01/23/2020   Wt Readings from Last 3 Encounters:  06/23/20 238 lb 12.8 oz (108.3 kg)  03/25/20 227 lb 9.6 oz (103.2 kg)  02/26/20 227 lb (103 kg)   Last seen for diabetes 3 months ago.  Management since then includes Will continue Glimepiride 23m, and Januvia 1063m Will add pioglitazone as below due to cost of other medications . He reports excellent compliance with treatment. He is not having side effects.   Symptoms: No fatigue No foot ulcerations  No appetite changes No nausea  No paresthesia of the feet  No polydipsia  No polyuria No visual disturbances   No vomiting     Home blood sugar records: fasting range: 180-290  Episodes of hypoglycemia? No    Current insulin regiment: none Most Recent Eye Exam: UTD Current exercise: walking Current diet habits: regular  Pertinent Labs: Lab Results  Component Value Date   CHOL 103 11/06/2019   HDL 38 (L) 11/06/2019   LDLCALC 39 11/06/2019   TRIG 154 (H) 11/06/2019   CHOLHDL 2.7 11/06/2019   Lab Results  Component Value Date   NA 144 11/06/2019   K 2.9 (L) 11/06/2019   CREATININE 1.03 11/06/2019   GFRNONAA 73 11/06/2019   GFRAA 84 11/06/2019   GLUCOSE 133 (H) 11/06/2019     ---------------------------------------------------------------------------------------------------  Patient Active Problem List   Diagnosis Date Noted  . Hallux valgus 07/31/2018  . IBS (irritable bowel syndrome) 01/21/2015  . B12 deficiency 01/07/2015  . Hyperlipidemia 01/07/2015  . Cannot sleep 01/07/2015  . Fatty liver  disease, nonalcoholic 0546/27/0350. Osteoarthritis of knee 01/07/2015  . Acne erythematosa 01/07/2015  . Disorder of male genital organs 01/07/2015  . Atypical chest pain 10/09/2014  . Acid reflux 09/06/2009  . Diabetes mellitus, type 2 ([place]04/04/2009  . Colon, diverticulosis 11/20/2008  . BP (high blood pressure) 11/20/2008  . Apnea, sleep 11/20/2008  . History of artificial joint 11/20/2008   Past Medical History:  Diagnosis Date  . Colon polyp 2005  . Diabetes mellitus ([place]  . GERD (gastroesophageal reflux disease)   . Hyperlipidemia   . Hypertension   . Rosacea   . Sleep apnea        Medications: Outpatient Medications Prior to Visit  Medication Sig  . aspirin EC 81 MG tablet Take 81 mg by mouth daily.   . Blood Glucose Monitoring Suppl (ONE TOUCH ULTRA 2) w/Device KIT To check blood sugar once daily  . cyclobenzaprine (FLEXERIL) 5 MG tablet TAKE 1 TABLET BY MOUTH EVERYDAY AT BEDTIME  . doxycycline (VIBRAMYCIN) 50 MG capsule PLEASE SEE ATTACHED FOR DETAILED DIRECTIONS  . glimepiride (AMARYL) 4 MG tablet TAKE 1 TABLET (4 MG TOTAL) BY MOUTH DAILY BEFORE BREAKFAST.  . hydrochlorothiazide (HYDRODIURIL) 25 MG tablet Take 1 tablet (25 mg total) by mouth daily.  . Lancets (ACCU-CHEK SOFT TOUCH) lancets To check blood glucose daily  . loratadine-pseudoephedrine (CLARITIN-D 24-HOUR) 10-240 MG per 24 hr tablet Take 1 tablet by mouth daily as needed for allergies.  .Marland Kitchen  losartan (COZAAR) 100 MG tablet TAKE 1 TABLET (100 MG TOTAL) BY MOUTH DAILY.  . metoprolol succinate (TOPROL-XL) 25 MG 24 hr tablet 1 TABLET BY MOUTH EVERY DAY  . metroNIDAZOLE (METROGEL) 1 % gel APPLY TO ROSACEA ONCE DAILY.  Marland Kitchen omeprazole (PRILOSEC) 40 MG capsule TAKE 1 CAPSULE BY MOUTH EVERY DAY  . ONETOUCH ULTRA test strip CHECK BLOOD SUGAR ONCE DAILY  . pioglitazone (ACTOS) 45 MG tablet TAKE 1 TABLET BY MOUTH EVERY DAY  . simvastatin (ZOCOR) 20 MG tablet TAKE 1 TABLET BY MOUTH EVERY DAY IN THE EVENING  .  sitaGLIPtin (JANUVIA) 100 MG tablet Take 1 tablet (100 mg total) by mouth daily.  Marland Kitchen tretinoin (RETIN-A) 0.05 % cream Apply 1 application topically at bedtime.   . vitamin B-12 (CYANOCOBALAMIN) 1000 MCG tablet Take 1,000 mcg by mouth daily.  . potassium chloride (KLOR-CON M10) 10 MEQ tablet Take 1 tablet (10 mEq total) by mouth daily.   No facility-administered medications prior to visit.    Review of Systems  Constitutional: Negative.   Respiratory: Negative.   Cardiovascular: Negative.   Gastrointestinal: Negative for diarrhea (much improved being off metformin).  Endocrine: Negative for polydipsia, polyphagia and polyuria.  Neurological: Negative.     Last CBC Lab Results  Component Value Date   WBC 6.5 11/06/2019   HGB 13.5 11/06/2019   HCT 38.2 11/06/2019   MCV 86 11/06/2019   MCH 30.5 11/06/2019   RDW 13.9 11/06/2019   PLT 172 57/26/2035   Last metabolic panel Lab Results  Component Value Date   GLUCOSE 133 (H) 11/06/2019   NA 144 11/06/2019   K 2.9 (L) 11/06/2019   CL 100 11/06/2019   CO2 25 11/06/2019   BUN 17 11/06/2019   CREATININE 1.03 11/06/2019   GFRNONAA 73 11/06/2019   GFRAA 84 11/06/2019   CALCIUM 9.2 11/06/2019   PROT 6.5 11/06/2019   ALBUMIN 4.4 11/06/2019   LABGLOB 2.1 11/06/2019   AGRATIO 2.1 11/06/2019   BILITOT 0.9 11/06/2019   ALKPHOS 58 11/06/2019   AST 29 11/06/2019   ALT 38 11/06/2019      Objective    BP 119/78 (BP Location: Left Arm, Patient Position: Sitting, Cuff Size: Large)   Pulse 77   Temp 98.2 F (36.8 C) (Oral)   Resp 16   Wt 238 lb 12.8 oz (108.3 kg)   BMI 32.39 kg/m  BP Readings from Last 3 Encounters:  06/23/20 119/78  03/25/20 114/72  02/26/20 125/81   Wt Readings from Last 3 Encounters:  06/23/20 238 lb 12.8 oz (108.3 kg)  03/25/20 227 lb 9.6 oz (103.2 kg)  02/26/20 227 lb (103 kg)      Physical Exam Vitals reviewed.  Constitutional:      General: He is not in acute distress.    Appearance: Normal  appearance. He is well-developed. He is obese. He is not ill-appearing or diaphoretic.  HENT:     Head: Normocephalic and atraumatic.  Cardiovascular:     Rate and Rhythm: Normal rate and regular rhythm.     Pulses: Normal pulses.     Heart sounds: Normal heart sounds. No murmur heard.  No friction rub. No gallop.   Pulmonary:     Effort: Pulmonary effort is normal. No respiratory distress.     Breath sounds: Normal breath sounds. No wheezing or rales.  Musculoskeletal:     Cervical back: Normal range of motion and neck supple.  Neurological:     General: No focal deficit present.  Mental Status: He is alert. Mental status is at baseline.  Psychiatric:        Mood and Affect: Mood normal.     Results for orders placed or performed in visit on 06/23/20  POCT glycosylated hemoglobin (Hb A1C)  Result Value Ref Range   Hemoglobin A1C 8.6 (A) 4.0 - 5.6 %   Est. average glucose Bld gHb Est-mCnc 200     Assessment & Plan     1. Type 2 diabetes mellitus with hyperglycemia, without long-term current use of insulin (HCC) A1c increased again. Will increase Glimepiride to 53m BID. Continue Pioglitazone 438mand Januvia 1003mF/U in 3 months.  - glimepiride (AMARYL) 4 MG tablet; Take 1 tablet (4 mg total) by mouth in the morning and at bedtime.  Dispense: 180 tablet; Refill: 1  2. Need for influenza vaccination Flu vaccine given today without complication. Patient sat upright for 15 minutes to check for adverse reaction before being released. - Flu Vaccine QUAD High Dose(Fluad)   No follow-ups on file.      I, Reynolds BowlA-C, have reviewed all documentation for this visit. The documentation on 06/29/20 for the exam, diagnosis, procedures, and orders are all accurate and complete.   JenRubye BeachurGastrointestinal Center Of Hialeah [phone]) [phone])  [place]

## 2020-06-22 ENCOUNTER — Other Ambulatory Visit: Payer: Self-pay | Admitting: Physician Assistant

## 2020-06-22 DIAGNOSIS — E119 Type 2 diabetes mellitus without complications: Secondary | ICD-10-CM

## 2020-06-23 ENCOUNTER — Ambulatory Visit (INDEPENDENT_AMBULATORY_CARE_PROVIDER_SITE_OTHER): Payer: PPO | Admitting: Physician Assistant

## 2020-06-23 ENCOUNTER — Other Ambulatory Visit: Payer: Self-pay

## 2020-06-23 ENCOUNTER — Encounter: Payer: Self-pay | Admitting: Physician Assistant

## 2020-06-23 VITALS — BP 119/78 | HR 77 | Temp 98.2°F | Resp 16 | Wt 238.8 lb

## 2020-06-23 DIAGNOSIS — Z23 Encounter for immunization: Secondary | ICD-10-CM | POA: Diagnosis not present

## 2020-06-23 DIAGNOSIS — E1165 Type 2 diabetes mellitus with hyperglycemia: Secondary | ICD-10-CM

## 2020-06-23 LAB — POCT GLYCOSYLATED HEMOGLOBIN (HGB A1C)
Est. average glucose Bld gHb Est-mCnc: 200
Hemoglobin A1C: 8.6 % — AB (ref 4.0–5.6)

## 2020-06-23 MED ORDER — GLIMEPIRIDE 4 MG PO TABS: 4.0000 mg | ORAL_TABLET | Freq: Two times a day (BID) | ORAL | 1 refills | Status: DC

## 2020-06-23 NOTE — Patient Instructions (Signed)

## 2020-06-24 DIAGNOSIS — L718 Other rosacea: Secondary | ICD-10-CM | POA: Diagnosis not present

## 2020-06-24 DIAGNOSIS — D485 Neoplasm of uncertain behavior of skin: Secondary | ICD-10-CM | POA: Diagnosis not present

## 2020-06-24 DIAGNOSIS — L720 Epidermal cyst: Secondary | ICD-10-CM | POA: Diagnosis not present

## 2020-06-29 ENCOUNTER — Encounter: Payer: Self-pay | Admitting: Physician Assistant

## 2020-07-01 ENCOUNTER — Ambulatory Visit: Payer: Self-pay | Admitting: Physician Assistant

## 2020-07-04 ENCOUNTER — Other Ambulatory Visit: Payer: Self-pay | Admitting: Physician Assistant

## 2020-07-04 DIAGNOSIS — E119 Type 2 diabetes mellitus without complications: Secondary | ICD-10-CM

## 2020-07-04 DIAGNOSIS — E1165 Type 2 diabetes mellitus with hyperglycemia: Secondary | ICD-10-CM

## 2020-07-05 MED ORDER — GLIMEPIRIDE 4 MG PO TABS: 4.0000 mg | ORAL_TABLET | Freq: Two times a day (BID) | ORAL | 1 refills | Status: DC

## 2020-07-05 NOTE — Addendum Note (Signed)
Addended by: Mar Daring on: 07/05/2020 03:22 PM   Modules accepted: Orders

## 2020-07-05 NOTE — Telephone Encounter (Signed)
Glimepiride refilled

## 2020-07-05 NOTE — Telephone Encounter (Signed)
Pt called saying the pharmacy told him they could not fill the old rx and will need a new one stating 2 a day one in the am and pm.  [facility]

## 2020-07-15 ENCOUNTER — Telehealth: Payer: Self-pay

## 2020-07-15 NOTE — Telephone Encounter (Signed)
Copied from [place] [phone]. Topic: General - Other >> Jul 15, 2020  9:57 AM Celene Kras wrote: Reason for CRM: Pt called and is requesting to speak with PCP regarding his neck pain. Please advise.

## 2020-07-16 ENCOUNTER — Telehealth: Payer: Self-pay | Admitting: Physician Assistant

## 2020-07-16 DIAGNOSIS — M542 Cervicalgia: Secondary | ICD-10-CM

## 2020-07-16 NOTE — Telephone Encounter (Signed)
I don't remember him talking about neck pain at the last office visit.

## 2020-07-16 NOTE — Telephone Encounter (Signed)
Per patient you saw back in august for this an gave him Flexeril reports that it helped some but not any more and that provider told him if that didn't help that she will place a referral and that he would like to get refer.

## 2020-07-16 NOTE — Telephone Encounter (Signed)
PT call to f/up

## 2020-07-16 NOTE — Telephone Encounter (Signed)
Patient is calling to request a referral to be placed Dr. Earnestine Leys with [facility]. Please advise

## 2020-07-16 NOTE — Telephone Encounter (Signed)
We had discussed but only in passing I believe. If it is getting worse he needs appt

## 2020-07-18 ENCOUNTER — Other Ambulatory Visit: Payer: Self-pay | Admitting: Physician Assistant

## 2020-07-18 DIAGNOSIS — E1165 Type 2 diabetes mellitus with hyperglycemia: Secondary | ICD-10-CM

## 2020-07-19 NOTE — Telephone Encounter (Signed)
Referral placed.

## 2020-07-19 NOTE — Telephone Encounter (Signed)
Please review. Ok to order referral?

## 2020-07-19 NOTE — Telephone Encounter (Signed)
There was a pduplicate note created and referral placed on that encounter to Dr. Camille Bal at patient request.

## 2020-07-20 ENCOUNTER — Telehealth: Payer: Self-pay | Admitting: Physician Assistant

## 2020-07-20 DIAGNOSIS — E1165 Type 2 diabetes mellitus with hyperglycemia: Secondary | ICD-10-CM

## 2020-07-20 MED ORDER — GLIMEPIRIDE 4 MG PO TABS: 4.0000 mg | ORAL_TABLET | Freq: Two times a day (BID) | ORAL | 1 refills | Status: DC

## 2020-07-20 NOTE — Telephone Encounter (Signed)
[facility] 4 mg . [facility] [place] [address]. Pt now takes this medication twice a day

## 2020-07-20 NOTE — Telephone Encounter (Signed)
[facility] [place]   Thanks,   -Mickel Baas

## 2020-07-26 DIAGNOSIS — S46812A Strain of other muscles, fascia and tendons at shoulder and upper arm level, left arm, initial encounter: Secondary | ICD-10-CM | POA: Insufficient documentation

## 2020-07-26 DIAGNOSIS — M542 Cervicalgia: Secondary | ICD-10-CM | POA: Diagnosis not present

## 2020-07-26 DIAGNOSIS — S29012A Strain of muscle and tendon of back wall of thorax, initial encounter: Secondary | ICD-10-CM | POA: Diagnosis not present

## 2020-07-27 DIAGNOSIS — L570 Actinic keratosis: Secondary | ICD-10-CM | POA: Diagnosis not present

## 2020-08-04 DIAGNOSIS — G4733 Obstructive sleep apnea (adult) (pediatric): Secondary | ICD-10-CM | POA: Diagnosis not present

## 2020-08-16 DIAGNOSIS — S29012A Strain of muscle and tendon of back wall of thorax, initial encounter: Secondary | ICD-10-CM | POA: Diagnosis not present

## 2020-08-16 DIAGNOSIS — M25512 Pain in left shoulder: Secondary | ICD-10-CM | POA: Diagnosis not present

## 2020-09-04 DIAGNOSIS — G4733 Obstructive sleep apnea (adult) (pediatric): Secondary | ICD-10-CM | POA: Diagnosis not present

## 2020-09-05 ENCOUNTER — Other Ambulatory Visit: Payer: Self-pay | Admitting: Physician Assistant

## 2020-09-05 DIAGNOSIS — E1165 Type 2 diabetes mellitus with hyperglycemia: Secondary | ICD-10-CM

## 2020-09-22 DIAGNOSIS — G4733 Obstructive sleep apnea (adult) (pediatric): Secondary | ICD-10-CM | POA: Diagnosis not present

## 2020-09-23 ENCOUNTER — Other Ambulatory Visit: Payer: Self-pay | Admitting: Physician Assistant

## 2020-09-23 DIAGNOSIS — I10 Essential (primary) hypertension: Secondary | ICD-10-CM

## 2020-09-24 ENCOUNTER — Encounter: Payer: Self-pay | Admitting: Physician Assistant

## 2020-09-24 ENCOUNTER — Other Ambulatory Visit: Payer: Self-pay

## 2020-09-24 ENCOUNTER — Ambulatory Visit (INDEPENDENT_AMBULATORY_CARE_PROVIDER_SITE_OTHER): Payer: PPO | Admitting: Physician Assistant

## 2020-09-24 VITALS — BP 134/83 | HR 68 | Temp 97.8°F | Resp 16 | Wt 244.0 lb

## 2020-09-24 DIAGNOSIS — I10 Essential (primary) hypertension: Secondary | ICD-10-CM

## 2020-09-24 DIAGNOSIS — E1165 Type 2 diabetes mellitus with hyperglycemia: Secondary | ICD-10-CM

## 2020-09-24 DIAGNOSIS — E7800 Pure hypercholesterolemia, unspecified: Secondary | ICD-10-CM

## 2020-09-24 DIAGNOSIS — L719 Rosacea, unspecified: Secondary | ICD-10-CM

## 2020-09-24 LAB — POCT GLYCOSYLATED HEMOGLOBIN (HGB A1C)
Est. average glucose Bld gHb Est-mCnc: 192
Hemoglobin A1C: 8.3 % — AB (ref 4.0–5.6)

## 2020-09-24 MED ORDER — TRETINOIN 0.05 % EX CREA
1.0000 | TOPICAL_CREAM | Freq: Every day | CUTANEOUS | 5 refills | Status: DC
Start: 2020-09-24 — End: 2021-12-21

## 2020-09-24 MED ORDER — PIOGLITAZONE HCL 45 MG PO TABS
45.0000 mg | ORAL_TABLET | Freq: Every day | ORAL | 1 refills | Status: DC
Start: 2020-09-24 — End: 2021-05-23

## 2020-09-24 MED ORDER — LOSARTAN POTASSIUM 100 MG PO TABS
100.0000 mg | ORAL_TABLET | Freq: Every day | ORAL | 1 refills | Status: DC
Start: 2020-09-24 — End: 2021-04-21

## 2020-09-24 MED ORDER — RYBELSUS 7 MG PO TABS: 7.0000 mg | ORAL_TABLET | Freq: Every day | ORAL | 5 refills | Status: DC

## 2020-09-24 MED ORDER — METOPROLOL SUCCINATE ER 25 MG PO TB24: 25.0000 mg | ORAL_TABLET | Freq: Every day | ORAL | 1 refills | Status: DC

## 2020-09-24 NOTE — Progress Notes (Signed)
Established patient visit   Patient: Ivan Kennedy   DOB: [birth year]   75 y.o. Male  MRN: [id number] Visit Date: 09/24/2020  Today's healthcare provider: Mar Daring, PA-C   Chief Complaint  Patient presents with  . Diabetes  . Hypertension  . Hyperlipidemia   Subjective    HPI  Diabetes Mellitus Type II, Follow-up  Lab Results  Component Value Date   HGBA1C 8.3 (A) 09/24/2020   HGBA1C 8.6 (A) 06/23/2020   HGBA1C 7.7 (A) 03/25/2020   Wt Readings from Last 3 Encounters:  09/24/20 244 lb (110.7 kg)  06/23/20 238 lb 12.8 oz (108.3 kg)  03/25/20 227 lb 9.6 oz (103.2 kg)   Last seen for diabetes 3 months ago.  Management since then includes increase Glimepiride to 13m BID, continue Pioglitazone 488mand Januvia 1004mHe reports excellent compliance with treatment. He is not having side effects.   Symptoms: No fatigue No foot ulcerations  No appetite changes No nausea  No paresthesia of the feet  No polydipsia  No polyuria No visual disturbances   No vomiting     Home blood sugar records: fasting range: 150-200's  Episodes of hypoglycemia? No    Current insulin regiment: none Most Recent Eye Exam: UTD Current exercise: walking Current diet habits: in general, a "healthy" diet    Pertinent Labs: Lab Results  Component Value Date   CHOL 103 11/06/2019   HDL 38 (L) 11/06/2019   LDLCALC 39 11/06/2019   TRIG 154 (H) 11/06/2019   CHOLHDL 2.7 11/06/2019   Lab Results  Component Value Date   NA 144 11/06/2019   K 2.9 (L) 11/06/2019   CREATININE 1.03 11/06/2019   GFRNONAA 73 11/06/2019   GFRAA 84 11/06/2019   GLUCOSE 133 (H) 11/06/2019     --------------------------------------------------------------------------------------------------- Hypertension, follow-up  BP Readings from Last 3 Encounters:  09/24/20 134/83  06/23/20 119/78  03/25/20 114/72   Wt Readings from Last 3 Encounters:  09/24/20 244 lb (110.7 kg)  06/23/20 238 lb 12.8 oz  (108.3 kg)  03/25/20 227 lb 9.6 oz (103.2 kg)     He was last seen for hypertension 8 months ago.  BP at that visit was 125/81. Management since that visit includes no changes.  He reports excellent compliance with treatment. He is not having side effects.  He is following a Regular diet. He is exercising. He does not smoke.  Use of agents associated with hypertension: none.   Outside blood pressures are stable. Symptoms: No chest pain No chest pressure  No palpitations No syncope  No dyspnea No orthopnea  No paroxysmal nocturnal dyspnea No lower extremity edema   Pertinent labs: Lab Results  Component Value Date   CHOL 103 11/06/2019   HDL 38 (L) 11/06/2019   LDLCALC 39 11/06/2019   TRIG 154 (H) 11/06/2019   CHOLHDL 2.7 11/06/2019   Lab Results  Component Value Date   NA 144 11/06/2019   K 2.9 (L) 11/06/2019   CREATININE 1.03 11/06/2019   GFRNONAA 73 11/06/2019   GFRAA 84 11/06/2019   GLUCOSE 133 (H) 11/06/2019     The ASCVD Risk score (Goff DC Jr., et al., 2013) failed to calculate for the following reasons:   The valid total cholesterol range is 130 to 320 mg/dL   --------------------------------------------------------------------------------------------------- Lipid/Cholesterol, Follow-up  Last lipid panel Other pertinent labs  Lab Results  Component Value Date   CHOL 103 11/06/2019   HDL 38 (L) 11/06/2019   LDLCALC 39  11/06/2019   TRIG 154 (H) 11/06/2019   CHOLHDL 2.7 11/06/2019   Lab Results  Component Value Date   ALT 38 11/06/2019   AST 29 11/06/2019   PLT 172 11/06/2019   TSH 1.650 10/30/2018     He was last seen for this 10 months ago.  Management since that visit includes no changes.  He reports excellent compliance with treatment. He is not having side effects.   Symptoms: No chest pain No chest pressure/discomfort  No dyspnea No lower extremity edema  No numbness or tingling of extremity No orthopnea  No palpitations No paroxysmal  nocturnal dyspnea  No speech difficulty No syncope   Current diet: in general, a "healthy" diet   Current exercise: walking  The ASCVD Risk score (McClusky., et al., 2013) failed to calculate for the following reasons:   The valid total cholesterol range is 130 to 320 mg/dL  ---------------------------------------------------------------------------------------------------   Patient Active Problem List   Diagnosis Date Noted  . Hallux valgus 07/31/2018  . IBS (irritable bowel syndrome) 01/21/2015  . B12 deficiency 01/07/2015  . Hyperlipidemia 01/07/2015  . Cannot sleep 01/07/2015  . Fatty liver disease, nonalcoholic 80/99/8338  . Osteoarthritis of knee 01/07/2015  . Acne erythematosa 01/07/2015  . Disorder of male genital organs 01/07/2015  . Atypical chest pain 10/09/2014  . Acid reflux 09/06/2009  . Diabetes mellitus, type 2 ([place]) 11/20/2008  . Colon, diverticulosis 11/20/2008  . BP (high blood pressure) 11/20/2008  . Apnea, sleep 11/20/2008  . History of artificial joint 11/20/2008   Social History   Tobacco Use  . Smoking status: Never Smoker  . Smokeless tobacco: Never Used  Vaping Use  . Vaping Use: Never used  Substance Use Topics  . Alcohol use: Yes    Alcohol/week: 0.0 standard drinks    Comment: occasionally- beer 1-2/month  . Drug use: No   No Known Allergies     Medications: Outpatient Medications Prior to Visit  Medication Sig  . aspirin EC 81 MG tablet Take 81 mg by mouth daily.   . Blood Glucose Monitoring Suppl (ONE TOUCH ULTRA 2) w/Device KIT To check blood sugar once daily  . doxycycline (VIBRAMYCIN) 50 MG capsule PLEASE SEE ATTACHED FOR DETAILED DIRECTIONS  . glimepiride (AMARYL) 4 MG tablet Take 1 tablet (4 mg total) by mouth in the morning and at bedtime.  . hydrochlorothiazide (HYDRODIURIL) 25 MG tablet Take 1 tablet (25 mg total) by mouth daily.  . Lancets (ACCU-CHEK SOFT TOUCH) lancets To check blood glucose daily  .  loratadine-pseudoephedrine (CLARITIN-D 24-HOUR) 10-240 MG per 24 hr tablet Take 1 tablet by mouth daily as needed for allergies.  Marland Kitchen losartan (COZAAR) 100 MG tablet TAKE 1 TABLET (100 MG TOTAL) BY MOUTH DAILY.  . metoprolol succinate (TOPROL-XL) 25 MG 24 hr tablet 1 TABLET BY MOUTH EVERY DAY  . metroNIDAZOLE (METROGEL) 1 % gel APPLY TO ROSACEA ONCE DAILY.  Marland Kitchen omeprazole (PRILOSEC) 40 MG capsule TAKE 1 CAPSULE BY MOUTH EVERY DAY  . ONETOUCH ULTRA test strip CHECK BLOOD SUGAR ONCE DAILY  . pioglitazone (ACTOS) 45 MG tablet TAKE 1 TABLET BY MOUTH EVERY DAY  . simvastatin (ZOCOR) 20 MG tablet TAKE 1 TABLET BY MOUTH EVERY DAY IN THE EVENING  . sitaGLIPtin (JANUVIA) 100 MG tablet Take 1 tablet (100 mg total) by mouth daily.  Marland Kitchen tretinoin (RETIN-A) 0.05 % cream Apply 1 application topically at bedtime.   . vitamin B-12 (CYANOCOBALAMIN) 1000 MCG tablet Take 1,000 mcg by mouth daily.  Marland Kitchen  potassium chloride (KLOR-CON M10) 10 MEQ tablet Take 1 tablet (10 mEq total) by mouth daily.   No facility-administered medications prior to visit.    Review of Systems  Constitutional: Negative for activity change, appetite change and fatigue.  Eyes: Negative for visual disturbance.  Respiratory: Negative for chest tightness and shortness of breath.   Cardiovascular: Negative for chest pain and palpitations.  Gastrointestinal: Negative for abdominal pain, nausea and vomiting.    Last CBC Lab Results  Component Value Date   WBC 6.5 11/06/2019   HGB 13.5 11/06/2019   HCT 38.2 11/06/2019   MCV 86 11/06/2019   MCH 30.5 11/06/2019   RDW 13.9 11/06/2019   PLT 172 11/06/2019   Last thyroid functions Lab Results  Component Value Date   TSH 1.650 10/30/2018        Objective    BP 134/83 (BP Location: Left Arm, Patient Position: Sitting, Cuff Size: Large)   Pulse 68   Temp 97.8 F (36.6 C) (Oral)   Resp 16   Wt 244 lb (110.7 kg)   BMI 33.09 kg/m  BP Readings from Last 3 Encounters:  09/24/20 134/83   06/23/20 119/78  03/25/20 114/72   Wt Readings from Last 3 Encounters:  09/24/20 244 lb (110.7 kg)  06/23/20 238 lb 12.8 oz (108.3 kg)  03/25/20 227 lb 9.6 oz (103.2 kg)      Physical Exam Vitals reviewed.  Constitutional:      General: He is not in acute distress.    Appearance: Normal appearance. He is well-developed and well-nourished. He is obese. He is not ill-appearing or diaphoretic.  HENT:     Head: Normocephalic and atraumatic.  Eyes:     General: No scleral icterus.    Extraocular Movements: Extraocular movements intact.  Cardiovascular:     Rate and Rhythm: Normal rate and regular rhythm.     Pulses: Normal pulses.     Heart sounds: Normal heart sounds. No murmur heard. No friction rub. No gallop.   Pulmonary:     Effort: Pulmonary effort is normal. No respiratory distress.     Breath sounds: Normal breath sounds. No wheezing or rales.  Musculoskeletal:     Cervical back: Normal range of motion and neck supple.     Right lower leg: No edema.     Left lower leg: No edema.  Skin:    Capillary Refill: Capillary refill takes less than 2 seconds.  Neurological:     General: No focal deficit present.     Mental Status: He is alert and oriented to person, place, and time. Mental status is at baseline.  Psychiatric:        Mood and Affect: Mood normal.        Thought Content: Thought content normal.     Diabetic Foot Exam - Simple   Simple Foot Form Diabetic Foot exam was performed with the following findings: Yes 09/24/2020  8:55 AM  Visual Inspection See comments: Yes Sensation Testing Intact to touch and monofilament testing bilaterally: Yes Pulse Check Posterior Tibialis and Dorsalis pulse intact bilaterally: Yes Comments Bilateral bunion      Results for orders placed or performed in visit on 09/24/20  POCT glycosylated hemoglobin (Hb A1C)  Result Value Ref Range   Hemoglobin A1C 8.3 (A) 4.0 - 5.6 %   Est. average glucose Bld gHb Est-mCnc 192      Assessment & Plan     1. Type 2 diabetes mellitus with hyperglycemia, without long-term current  use of insulin (HCC) A1c improved from 8.6 to 8.3. Will continue Januvia 150m, Glimepiride 490mBID, and Pioglitazone 4559mWill add Rybelsus as below. At follow up in 3 months, if A1c is improving and he is tolerating the medication, he may be able to titrate down to once daily glimepiride. Patient is unable to tolerate metformin. Had been on for years but developed severe diarrhea. Diarrhea has been much better off metformin. F/U in 3 months for AWV/CPE.  - POCT glycosylated hemoglobin (Hb A1C) - Semaglutide (RYBELSUS) 7 MG TABS; Take 7 mg by mouth daily.  Dispense: 30 tablet; Refill: 5 - pioglitazone (ACTOS) 45 MG tablet; Take 1 tablet (45 mg total) by mouth daily.  Dispense: 90 tablet; Refill: 1  2. Essential hypertension Stable. Diagnosis pulled for medication refill. Continue current medical treatment plan. - losartan (COZAAR) 100 MG tablet; Take 1 tablet (100 mg total) by mouth daily.  Dispense: 90 tablet; Refill: 1 - metoprolol succinate (TOPROL-XL) 25 MG 24 hr tablet; Take 1 tablet (25 mg total) by mouth daily.  Dispense: 90 tablet; Refill: 1  3. Rosacea Stable. Diagnosis pulled for medication refill. Continue current medical treatment plan. - tretinoin (RETIN-A) 0.05 % cream; Apply 1 application topically at bedtime.  Dispense: 45 g; Refill: 5  4. Pure hypercholesterolemia Stable. Continue Simvastatin 50m55m  No follow-ups on file.      I, JReynolds Bowl-C, have reviewed all documentation for this visit. The documentation on 09/24/20 for the exam, diagnosis, procedures, and orders are all accurate and complete.   JennRubye BeachrlKahi Mohala-[phone]) [phone])  [place]

## 2020-09-27 ENCOUNTER — Ambulatory Visit: Payer: Self-pay | Admitting: *Deleted

## 2020-09-27 NOTE — Telephone Encounter (Signed)
FYI

## 2020-09-27 NOTE — Telephone Encounter (Signed)
Per initial encounter, "Patient unsure if should d/c sitaGLIPtin (JANUVIA) 100 MG tablet because he started Semaglutide (RYBELSUS) 7 MG TABS"; per note from [facility] 09/24/20, "Will continue Januvia 100mg , Glimepiride 4mg  BID, and Pioglitazone 45mg . Will add Rybelsus as below"; pt notified; he verbalized understanding; will route to office for notification of encounter.  Reason for Disposition . Caller has medicine question only, adult not sick, AND triager answers question  Answer Assessment - Initial Assessment Questions 1. NAME of MEDICATION: "What medicine are you calling about?"     Rybelsus 2. QUESTION: "What is your question?" (e.g., medication refill, side effect)    Should I take Rybelsus and Januvia ? 3. PRESCRIBING HCP: "Who prescribed it?" Reason: if prescribed by specialist, call should be referred to that group.     [name] 4. SYMPTOMS: "Do you have any symptoms?"   n/a 5. SEVERITY: If symptoms are present, ask "Are they mild, moderate or severe?"     n/a 6. PREGNANCY:  "Is there any chance that you are pregnant?" "When was your last menstrual period?" n/a  Protocols used: MEDICATION QUESTION CALL-A-AH

## 2020-10-05 DIAGNOSIS — G4733 Obstructive sleep apnea (adult) (pediatric): Secondary | ICD-10-CM | POA: Diagnosis not present

## 2020-10-19 ENCOUNTER — Telehealth: Payer: Self-pay | Admitting: Physician Assistant

## 2020-10-19 ENCOUNTER — Telehealth: Payer: Self-pay

## 2020-10-19 NOTE — Telephone Encounter (Signed)
Copied from [place] [phone]. Topic: Medicare AWV >> Oct 19, 2020 11:42 AM [name] wrote: Reason for CRM:  Left message for patient to call back and schedule Medicare Annual Wellness Visit (AWV) in office.   If not able to come in office, please offer to do virtually or by telephone.   Last AWV: 10/23/2019  Please schedule at anytime with [facility] Health Advisor.  If any questions, please contact me at [phone] >> Oct 19, 2020  5:04 PM [name], [name] wrote: Pt returned call to the office but declined to schedule appt

## 2020-10-19 NOTE — Telephone Encounter (Signed)
Copied from [place] [phone]. Topic: Medicare AWV >> Oct 19, 2020 11:42 AM [name] wrote: Reason for CRM:  Left message for patient to call back and schedule Medicare Annual Wellness Visit (AWV) in office.   If not able to come in office, please offer to do virtually or by telephone.   Last AWV: 10/23/2019  Please schedule at anytime with [facility] Health Advisor.  If any questions, please contact me at [phone] >> Oct 19, 2020  5:04 PM [name], [name] wrote: Pt returned call to the office but declined to schedule appt

## 2020-10-19 NOTE — Telephone Encounter (Signed)
Copied from [place] [phone]. Topic: Medicare AWV >> Oct 19, 2020 11:42 AM [name] wrote: Reason for CRM:  Left message for patient to call back and schedule Medicare Annual Wellness Visit (AWV) in office.   If not able to come in office, please offer to do virtually or by telephone.   Last AWV: 10/23/2019  Please schedule at anytime with [facility] Health Advisor.  If any questions, please contact me at [phone]

## 2020-10-20 NOTE — Telephone Encounter (Signed)
FYI

## 2020-10-20 NOTE — Telephone Encounter (Signed)
Copied from [place] [id number]. Topic: Medicare AWV >> Oct 19, 2020 11:42 AM [name] wrote: Reason for CRM:  Left message for patient to call back and schedule Medicare Annual Wellness Visit (AWV) in office.   If not able to come in office, please offer to do virtually or by telephone.   Last AWV: 10/23/2019  Please schedule at anytime with [facility] Health Advisor.  If any questions, please contact me at [phone] >> Oct 19, 2020  5:04 PM [name], [name] wrote: Pt returned call to the office but declined to schedule appt

## 2020-11-02 DIAGNOSIS — G4733 Obstructive sleep apnea (adult) (pediatric): Secondary | ICD-10-CM | POA: Diagnosis not present

## 2020-11-04 DIAGNOSIS — Z85828 Personal history of other malignant neoplasm of skin: Secondary | ICD-10-CM | POA: Diagnosis not present

## 2020-11-04 DIAGNOSIS — L578 Other skin changes due to chronic exposure to nonionizing radiation: Secondary | ICD-10-CM | POA: Diagnosis not present

## 2020-11-04 DIAGNOSIS — L570 Actinic keratosis: Secondary | ICD-10-CM | POA: Diagnosis not present

## 2020-11-04 DIAGNOSIS — Z859 Personal history of malignant neoplasm, unspecified: Secondary | ICD-10-CM | POA: Diagnosis not present

## 2020-11-04 DIAGNOSIS — Z872 Personal history of diseases of the skin and subcutaneous tissue: Secondary | ICD-10-CM | POA: Diagnosis not present

## 2020-12-03 DIAGNOSIS — G4733 Obstructive sleep apnea (adult) (pediatric): Secondary | ICD-10-CM | POA: Diagnosis not present

## 2020-12-23 ENCOUNTER — Ambulatory Visit (INDEPENDENT_AMBULATORY_CARE_PROVIDER_SITE_OTHER): Payer: PPO | Admitting: Family Medicine

## 2020-12-23 ENCOUNTER — Other Ambulatory Visit: Payer: Self-pay

## 2020-12-23 ENCOUNTER — Encounter: Payer: Self-pay | Admitting: Family Medicine

## 2020-12-23 VITALS — BP 135/75 | HR 62 | Temp 97.7°F | Resp 16 | Ht 72.5 in | Wt 242.0 lb

## 2020-12-23 DIAGNOSIS — E785 Hyperlipidemia, unspecified: Secondary | ICD-10-CM

## 2020-12-23 DIAGNOSIS — E1159 Type 2 diabetes mellitus with other circulatory complications: Secondary | ICD-10-CM | POA: Diagnosis not present

## 2020-12-23 DIAGNOSIS — I152 Hypertension secondary to endocrine disorders: Secondary | ICD-10-CM | POA: Diagnosis not present

## 2020-12-23 DIAGNOSIS — H8110 Benign paroxysmal vertigo, unspecified ear: Secondary | ICD-10-CM | POA: Insufficient documentation

## 2020-12-23 DIAGNOSIS — Z0000 Encounter for general adult medical examination without abnormal findings: Secondary | ICD-10-CM | POA: Diagnosis not present

## 2020-12-23 DIAGNOSIS — E1165 Type 2 diabetes mellitus with hyperglycemia: Secondary | ICD-10-CM | POA: Diagnosis not present

## 2020-12-23 DIAGNOSIS — E1169 Type 2 diabetes mellitus with other specified complication: Secondary | ICD-10-CM

## 2020-12-23 MED ORDER — METFORMIN HCL ER 500 MG PO TB24: 500.0000 mg | ORAL_TABLET | Freq: Every day | ORAL | 0 refills | Status: DC

## 2020-12-23 MED ORDER — MECLIZINE HCL 25 MG PO TABS: 25.0000 mg | ORAL_TABLET | Freq: Three times a day (TID) | ORAL | 0 refills | Status: DC | PRN

## 2020-12-23 NOTE — Assessment & Plan Note (Signed)
Well controlled Continue current medications Recheck metabolic panel F/u in 6 months  

## 2020-12-23 NOTE — Assessment & Plan Note (Signed)
New problem Symptoms c/w BPPV No orthostasis or neuro deficit Meclizine prn Epley maneuver at home

## 2020-12-23 NOTE — Patient Instructions (Addendum)
Discontinue januvia Continue other meds, but add Metformin XR once daily    The CDC recommends two doses of Shingrix (the shingles vaccine) separated by 2 to 6 months for adults age 73 years and older. I recommend checking with your insurance plan regarding coverage for this vaccine.      Health Maintenance After Age 47 After age 19, you are at a higher risk for certain long-term diseases and infections as well as injuries from falls. Falls are a major cause of broken bones and head injuries in people who are older than age 68. Getting regular preventive care can help to keep you healthy and well. Preventive care includes getting regular testing and making lifestyle changes as recommended by your health care provider. Talk with your health care provider about:  Which screenings and tests you should have. A screening is a test that checks for a disease when you have no symptoms.  A diet and exercise plan that is right for you. What should I know about screenings and tests to prevent falls? Screening and testing are the best ways to find a health problem early. Early diagnosis and treatment give you the best chance of managing medical conditions that are common after age 37. Certain conditions and lifestyle choices may make you more likely to have a fall. Your health care provider may recommend:  Regular vision checks. Poor vision and conditions such as cataracts can make you more likely to have a fall. If you wear glasses, make sure to get your prescription updated if your vision changes.  Medicine review. Work with your health care provider to regularly review all of the medicines you are taking, including over-the-counter medicines. Ask your health care provider about any side effects that may make you more likely to have a fall. Tell your health care provider if any medicines that you take make you feel dizzy or sleepy.  Osteoporosis screening. Osteoporosis is a condition that causes the  bones to get weaker. This can make the bones weak and cause them to break more easily.  Blood pressure screening. Blood pressure changes and medicines to control blood pressure can make you feel dizzy.  Strength and balance checks. Your health care provider may recommend certain tests to check your strength and balance while standing, walking, or changing positions.  Foot health exam. Foot pain and numbness, as well as not wearing proper footwear, can make you more likely to have a fall.  Depression screening. You may be more likely to have a fall if you have a fear of falling, feel emotionally low, or feel unable to do activities that you used to do.  Alcohol use screening. Using too much alcohol can affect your balance and may make you more likely to have a fall. What actions can I take to lower my risk of falls? General instructions  Talk with your health care provider about your risks for falling. Tell your health care provider if: ? You fall. Be sure to tell your health care provider about all falls, even ones that seem minor. ? You feel dizzy, sleepy, or off-balance.  Take over-the-counter and prescription medicines only as told by your health care provider. These include any supplements.  Eat a healthy diet and maintain a healthy weight. A healthy diet includes low-fat dairy products, low-fat (lean) meats, and fiber from whole grains, beans, and lots of fruits and vegetables. Home safety  Remove any tripping hazards, such as rugs, cords, and clutter.  Install safety equipment such  as grab bars in bathrooms and safety rails on stairs.  Keep rooms and walkways well-lit. Activity  Follow a regular exercise program to stay fit. This will help you maintain your balance. Ask your health care provider what types of exercise are appropriate for you.  If you need a cane or walker, use it as recommended by your health care provider.  Wear supportive shoes that have nonskid soles.    Lifestyle  Do not drink alcohol if your health care provider tells you not to drink.  If you drink alcohol, limit how much you have: ? 0-1 drink a day for women. ? 0-2 drinks a day for men.  Be aware of how much alcohol is in your drink. In the [place] one drink equals one typical bottle of beer (12 oz), one-half glass of wine (5 oz), or one shot of hard liquor (1 oz).  Do not use any products that contain nicotine or tobacco, such as cigarettes and e-cigarettes. If you need help quitting, ask your health care provider. Summary  Having a healthy lifestyle and getting preventive care can help to protect your health and wellness after age 20.  Screening and testing are the best way to find a health problem early and help you avoid having a fall. Early diagnosis and treatment give you the best chance for managing medical conditions that are more common for people who are older than age 74.  Falls are a major cause of broken bones and head injuries in people who are older than age 59. Take precautions to prevent a fall at home.  Work with your health care provider to learn what changes you can make to improve your health and wellness and to prevent falls. This information is not intended to replace advice given to you by your health care provider. Make sure you discuss any questions you have with your health care provider. Document Revised: 11/21/2018 Document Reviewed: 06/13/2017 Elsevier Patient Education  2021 [employer] [place].     How to Perform the Epley Maneuver The Epley maneuver is an exercise that relieves symptoms of vertigo. Vertigo is the feeling that you or your surroundings are moving when they are not. When you feel vertigo, you may feel like the room is spinning and may have trouble walking. The Epley maneuver is used for a type of vertigo caused by a calcium deposit in a part of the inner ear. The maneuver involves changing head positions to help the deposit move out of the  area. You can do this maneuver at home whenever you have symptoms of vertigo. You can repeat it in 24 hours if your vertigo has not gone away. Even though the Epley maneuver may relieve your vertigo for a few weeks, it is possible that your symptoms will return. This maneuver relieves vertigo, but it does not relieve dizziness. What are the risks? If it is done correctly, the Epley maneuver is considered safe. Sometimes it can lead to dizziness or nausea that goes away after a short time. If you develop other symptoms--such as changes in vision, weakness, or numbness--stop doing the maneuver and call your health care provider. Supplies needed:  A bed or table.  A pillow. How to do the Epley maneuver 1. Sit on the edge of a bed or table with your back straight and your legs extended or hanging over the edge of the bed or table. 2. Turn your head halfway toward the affected ear or side as told by your health care  provider. 3. Lie backward quickly with your head turned until you are lying flat on your back. You may want to position a pillow under your shoulders. 4. Hold this position for at least 30 seconds. If you feel dizzy or have symptoms of vertigo, continue to hold the position until the symptoms stop. 5. Turn your head to the opposite direction until your unaffected ear is facing the floor. 6. Hold this position for at least 30 seconds. If you feel dizzy or have symptoms of vertigo, continue to hold the position until the symptoms stop. 7. Turn your whole body to the same side as your head so that you are positioned on your side. Your head will now be nearly facedown. Hold for at least 30 seconds. If you feel dizzy or have symptoms of vertigo, continue to hold the position until the symptoms stop. 8. Sit back up. You can repeat the maneuver in 24 hours if your vertigo does not go away.      Follow these instructions at home: For 24 hours after doing the Epley maneuver:  Keep your head in  an upright position.  When lying down to sleep or rest, keep your head raised (elevated) with two or more pillows.  Avoid excessive neck movements. Activity  Do not drive or use machinery if you feel dizzy.  After doing the Epley maneuver, return to your normal activities as told by your health care provider. Ask your health care provider what activities are safe for you. General instructions  Drink enough fluid to keep your urine pale yellow.  Do not drink alcohol.  Take over-the-counter and prescription medicines only as told by your health care provider.  Keep all follow-up visits as told by your health care provider. This is important. Preventing vertigo symptoms Ask your health care provider if there is anything you should do at home to prevent vertigo. He or she may recommend that you:  Keep your head elevated with two or more pillows while you sleep.  Do not sleep on the side of your affected ear.  Get up slowly from bed.  Avoid sudden movements during the day.  Avoid extreme head positions or movement, such as looking up or bending over. Contact a health care provider if:  Your vertigo gets worse.  You have other symptoms, including: ? Nausea. ? Vomiting. ? Headache. Get help right away if you:  Have vision changes.  Have a headache or neck pain that is severe or getting worse.  Cannot stop vomiting.  Have new numbness or weakness in any part of your body. Summary  Vertigo is the feeling that you or your surroundings are moving when they are not.  The Epley maneuver is an exercise that relieves symptoms of vertigo.  If the Epley maneuver is done correctly, it is considered safe and relieves vertigo quickly. This information is not intended to replace advice given to you by your health care provider. Make sure you discuss any questions you have with your health care provider. Document Revised: 05/28/2019 Document Reviewed: 05/28/2019 Elsevier Patient  Education  2021 [employer].

## 2020-12-23 NOTE — Assessment & Plan Note (Signed)
Chronic and uncontrolled with hyperglycemia Continue glimepiride and Actos and Rybelsus at current dose Stop Januvia as should not be taking DPP 4 and GLP-1 together Reports he was previously well controlled with metformin, but got GI side effects, so we will try metformin XR in addition to his current medications Up-to-date on eye exam and foot exam On ACE/ARB and statin Up-to-date on vaccinations Recheck A1c Follow-up in 3 months

## 2020-12-23 NOTE — Assessment & Plan Note (Signed)
Previously well controlled Continue statin Repeat FLP and CMP Goal LDL < 70 

## 2020-12-23 NOTE — Progress Notes (Signed)
Complete physical exam   Patient: Ivan Kennedy   DOB: 01/14/1946   75 y.o. Male  MRN: [id number] Visit Date: 12/23/2020  Today's healthcare provider: Lavon Paganini, MD   Chief Complaint  Patient presents with  . Annual Exam   Subjective     HPI   Ivan Kennedy is a 75 y.o. male who presents today for a complete physical exam.  He reports consuming a general diet. Home exercise routine includes walking. He generally feels well.  Dizziness/Blurred Vision Jevan reports that he does go to [facility] for his annual check ups( last recorded eye exam 02/24/20). He has been experiencing intermittent dizziness, fatigue blurred vision. He's had these symptoms for 1 month. He repots these episodes occur when he is getting out of bed or when he is sitting then stands up. He feels like he is going to fall.  Diabetes He is currently taking Rybelsus and Januvia for his diabetes. He is concerned about taking 2 medications for his diabetes. He is interested in trying a new medication to decrease the cost. He used to take metformin but he stopped taking it because he was having stomach issues. He is having difficulties finding a medication that controls his A1C levels.   Vaccines He received both dosages of the COVID vaccines and had side affects after both dosages. He denies receiving the shingles vaccines. He reports having a colonoscopy   Arthritis  He has been diagnosed with arthritis in his neck and he does daily exercises that improve his symptoms.   Last Reported Colonoscopy 01/19/20 Past Medical History:  Diagnosis Date  . Colon polyp 2005  . Diabetes mellitus ([place])   . GERD (gastroesophageal reflux disease)   . Hyperlipidemia   . Hypertension   . Rosacea   . Sleep apnea    Past Surgical History:  Procedure Laterality Date  . ANAL FISSURE REPAIR  1998  . COLONOSCOPY  2005, 2010  . COLONOSCOPY WITH PROPOFOL N/A 01/19/2020   Procedure: COLONOSCOPY WITH PROPOFOL;   Surgeon: Jonathon Bellows, MD;  [facility] ENDOSCOPY;  Service: Gastroenterology;  Laterality: N/A;  . 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 Earnestine Leys  . shoulder spurs Right 2000   Social History   Socioeconomic History  . Marital status: Married    Spouse name: Not on file  . Number of children: 1  . Years of education: Not on file  . Highest education level: GED or equivalent  Occupational History  . Occupation: retired  Tobacco Use  . Smoking status: Never Smoker  . Smokeless tobacco: Never Used  Vaping Use  . Vaping Use: Never used  Substance and Sexual Activity  . Alcohol use: Yes    Alcohol/week: 0.0 standard drinks    Comment: occasionally- beer 1-2/month  . Drug use: No  . Sexual activity: Not on file  Other Topics Concern  . Not on file  Social History Narrative  . Not on file   Social Determinants of Health   Financial Resource Strain: Not on file  Food Insecurity: Not on file  Transportation Needs: Not on file  Physical Activity: Not on file  Stress: Not on file  Social Connections: Not on file  Intimate Partner Violence: Not on file   Family Status  Relation Name Status  . Mother  Deceased  . Father  Deceased  . Sister  Alive  . Brother  Alive   Family History  Problem Relation Age of Onset  .  Diabetes Mother    No Known Allergies  Patient Care Team: Margene Cherian, Dionne Bucy, MD as PCP - General (Family Medicine) Arelia Sneddon, OD as Consulting Physician (Optometry) Jannet Mantis, MD as Consulting Physician (Dermatology) Jonathon Bellows, MD as Consulting Physician (Gastroenterology)   Medications: Outpatient Medications Prior to Visit  Medication Sig  . aspirin EC 81 MG tablet Take 81 mg by mouth daily.   . Blood Glucose Monitoring Suppl (ONE TOUCH ULTRA 2) w/Device KIT To check blood sugar once daily  . glimepiride (AMARYL) 4 MG tablet Take 1 tablet (4 mg total) by mouth in the morning and at bedtime.  . hydrochlorothiazide  (HYDRODIURIL) 25 MG tablet Take 1 tablet (25 mg total) by mouth daily.  . Lancets (ACCU-CHEK SOFT TOUCH) lancets To check blood glucose daily  . loratadine-pseudoephedrine (CLARITIN-D 24-HOUR) 10-240 MG per 24 hr tablet Take 1 tablet by mouth daily as needed for allergies.  Marland Kitchen losartan (COZAAR) 100 MG tablet Take 1 tablet (100 mg total) by mouth daily.  . metoprolol succinate (TOPROL-XL) 25 MG 24 hr tablet Take 1 tablet (25 mg total) by mouth daily.  . metroNIDAZOLE (METROGEL) 1 % gel APPLY TO ROSACEA ONCE DAILY.  Marland Kitchen omeprazole (PRILOSEC) 40 MG capsule TAKE 1 CAPSULE BY MOUTH EVERY DAY  . ONETOUCH ULTRA test strip CHECK BLOOD SUGAR ONCE DAILY  . pioglitazone (ACTOS) 45 MG tablet Take 1 tablet (45 mg total) by mouth daily.  . Semaglutide (RYBELSUS) 7 MG TABS Take 7 mg by mouth daily.  . simvastatin (ZOCOR) 20 MG tablet TAKE 1 TABLET BY MOUTH EVERY DAY IN THE EVENING  . tretinoin (RETIN-A) 0.05 % cream Apply 1 application topically at bedtime.  . vitamin B-12 (CYANOCOBALAMIN) 1000 MCG tablet Take 1,000 mcg by mouth daily.  . [DISCONTINUED] sitaGLIPtin (JANUVIA) 100 MG tablet Take 1 tablet (100 mg total) by mouth daily.  . [DISCONTINUED] doxycycline (VIBRAMYCIN) 50 MG capsule PLEASE SEE ATTACHED FOR DETAILED DIRECTIONS (Patient not taking: Reported on 12/23/2020)   No facility-administered medications prior to visit.    Review of Systems  Constitutional: Positive for fatigue. Negative for chills and fever.  HENT: Negative for congestion, ear pain, nosebleeds, rhinorrhea, sinus pressure, sinus pain and sore throat.   Eyes: Positive for visual disturbance. Negative for pain.  Respiratory: Positive for shortness of breath. Negative for cough, chest tightness and wheezing.   Cardiovascular: Negative for chest pain, palpitations and leg swelling.  Gastrointestinal: Negative for abdominal pain, blood in stool, constipation, diarrhea, nausea and vomiting.  Genitourinary: Negative for dysuria, flank  pain, frequency and urgency.  Musculoskeletal: Positive for neck pain. Negative for arthralgias, back pain, gait problem and neck stiffness.  Neurological: Positive for dizziness and light-headedness. Negative for syncope, weakness, numbness and headaches.  All other systems reviewed and are negative.     Objective    BP 135/75   Pulse 62   Temp 97.7 F (36.5 C) (Oral)   Resp 16   Ht 6' 0.5" (1.842 m)   Wt 242 lb (109.8 kg)   SpO2 100%   BMI 32.37 kg/m     Orthostatic VS for the past 72 hrs (Last 3 readings):  Orthostatic BP Patient Position BP Location Cuff Size Orthostatic Pulse  12/23/20 0922 127/67 Supine Left Arm Large 59  12/23/20 0920 133/77 Standing Left Arm Large 70  12/23/20 0918 131/74 Sitting Left Arm Large 65   Physical Exam Vitals reviewed.  Constitutional:      General: He is not in acute distress.  Appearance: Normal appearance. He is well-developed. He is not diaphoretic.  HENT:     Head: Normocephalic and atraumatic.     Right Ear: Tympanic membrane, ear canal and external ear normal.     Left Ear: Tympanic membrane, ear canal and external ear normal.     Nose: Nose normal.     Mouth/Throat:     Mouth: Mucous membranes are moist.     Pharynx: Oropharynx is clear. No oropharyngeal exudate.  Eyes:     General: No scleral icterus.    Extraocular Movements:     Right eye: Nystagmus present.     Left eye: Nystagmus present.     Conjunctiva/sclera: Conjunctivae normal.     Pupils: Pupils are equal, round, and reactive to light.  Neck:     Thyroid: No thyromegaly.  Cardiovascular:     Rate and Rhythm: Normal rate and regular rhythm.     Pulses: Normal pulses.     Heart sounds: Normal heart sounds. No murmur heard.   Pulmonary:     Effort: Pulmonary effort is normal. No respiratory distress.     Breath sounds: Normal breath sounds. No wheezing or rales.  Abdominal:     General: There is no distension.     Palpations: Abdomen is soft.      Tenderness: There is no abdominal tenderness.  Musculoskeletal:        General: No deformity.     Cervical back: Neck supple.     Right lower leg: No edema.     Left lower leg: No edema.  Lymphadenopathy:     Cervical: No cervical adenopathy.  Skin:    General: Skin is warm and dry.     Findings: No rash.  Neurological:     Mental Status: He is alert and oriented to person, place, and time. Mental status is at baseline.     Sensory: No sensory deficit.     Motor: No weakness.     Gait: Gait normal.  Psychiatric:        Mood and Affect: Mood normal.        Behavior: Behavior normal.        Thought Content: Thought content normal.       Last depression screening scores PHQ 2/9 Scores 12/23/2020 10/23/2019 10/17/2018  PHQ - 2 Score 0 0 0  PHQ- 9 Score 1 - 1   Last fall risk screening Fall Risk  12/23/2020  Falls in the past year? 0  Number falls in past yr: 0  Injury with Fall? 0  Comment -   Last Audit-C alcohol use screening Alcohol Use Disorder Test (AUDIT) 12/23/2020  1. How often do you have a drink containing alcohol? 2  2. How many drinks containing alcohol do you have on a typical day when you are drinking? 0  3. How often do you have six or more drinks on one occasion? 0  AUDIT-C Score 2   A score of 3 or more in women, and 4 or more in men indicates increased risk for alcohol abuse, EXCEPT if all of the points are from question 1   No results found for any visits on 12/23/20.  Assessment & Plan     Problem List Items Addressed This Visit      Cardiovascular and Mediastinum   Hypertension associated with diabetes ([place])    Well controlled Continue current medications Recheck metabolic panel F/u in 6 months       Relevant Medications  metFORMIN (GLUCOPHAGE XR) 500 MG 24 hr tablet   Other Relevant Orders   Comprehensive metabolic panel     Endocrine   Diabetes mellitus, type 2 (H[facility])    Chronic and uncontrolled with hyperglycemia Continue glimepiride  and Actos and Rybelsus at current dose Stop Januvia as should not be taking DPP 4 and GLP-1 together Reports he was previously well controlled with metformin, but got GI side effects, so we will try metformin XR in addition to his current medications Up-to-date on eye exam and foot exam On ACE/ARB and statin Up-to-date on vaccinations Recheck A1c Follow-up in 3 months      Relevant Medications   metFORMIN (GLUCOPHAGE XR) 500 MG 24 hr tablet   Other Relevant Orders   Hemoglobin A1c   Hyperlipidemia associated with type 2 diabetes mellitus ([place])    Previously well controlled Continue statin Repeat FLP and CMP Goal LDL <70      Relevant Medications   metFORMIN (GLUCOPHAGE XR) 500 MG 24 hr tablet   Other Relevant Orders   Comprehensive metabolic panel   Lipid panel     Nervous and Auditory   Benign paroxysmal positional vertigo    New problem Symptoms c/w BPPV No orthostasis or neuro deficit Meclizine prn Epley maneuver at home       Other Visit Diagnoses    Encounter for annual wellness visit (AWV) in Medicare patient    -  Primary   Encounter for annual physical exam          Routine Health Maintenance and Physical Exam  Exercise Activities and Dietary recommendations Goals    . DIET - INCREASE WATER INTAKE     Recommend increasing water intake to 4-6 glasses a day.    Marland Kitchen DIET - REDUCE CARBOHYDRATES     Recommend to cut back on carbohydrates to 1 serving a day and to focus on eating more good carbohydrates.        Immunization History  Administered Date(s) Administered  . Fluad Quad(high Dose 65+) 05/08/2019, 06/23/2020  . Hepatitis B, adult 05/29/2012, 07/29/2012, 03/18/2013  . Influenza, High Dose Seasonal PF 07/06/2014, 05/06/2015, 07/11/2016, 04/26/2017, 04/24/2018  . PFIZER(Purple Top)SARS-COV-2 Vaccination 09/29/2019, 10/28/2019  . Pneumococcal Conjugate-13 07/06/2014  . Pneumococcal Polysaccharide-23 05/29/2012, 10/30/2018    Health Maintenance   Topic Date Due  . TETANUS/TDAP  Never done  . COVID-19 Vaccine (3 - Booster for Pfizer series) 03/29/2020  . OPHTHALMOLOGY EXAM  02/23/2021  . INFLUENZA VACCINE  03/14/2021  . HEMOGLOBIN A1C  03/24/2021  . FOOT EXAM  09/24/2021  . COLONOSCOPY (Pts 45-71yr Insurance coverage will need to be confirmed)  01/19/2023  . Hepatitis C Screening  Completed  . PNA vac Low Risk Adult  Completed  . HPV VACCINES  Aged Out    Discussed health benefits of physical activity, and encouraged him to engage in regular exercise appropriate for his age and condition.   Return in about 3 months (around 03/25/2021) for chronic disease f/u.     I,Essence Turner,acting as a sEducation administratorfor ALavon Paganini MD.,have documented all relevant documentation on the behalf of ALavon Paganini MD,as directed by  ALavon Paganini MD while in the presence of ALavon Paganini MD.  I, ALavon Paganini MD, have reviewed all documentation for this visit. The documentation on 12/23/20 for the exam, diagnosis, procedures, and orders are all accurate and complete.   Edgerrin Correia, ADionne Bucy MD, MPH [employer]

## 2020-12-24 ENCOUNTER — Other Ambulatory Visit: Payer: Self-pay | Admitting: Physician Assistant

## 2020-12-24 ENCOUNTER — Telehealth: Payer: Self-pay

## 2020-12-24 DIAGNOSIS — E1165 Type 2 diabetes mellitus with hyperglycemia: Secondary | ICD-10-CM

## 2020-12-24 DIAGNOSIS — E7800 Pure hypercholesterolemia, unspecified: Secondary | ICD-10-CM

## 2020-12-24 LAB — COMPREHENSIVE METABOLIC PANEL
ALT: 25 IU/L (ref 0–44)
AST: 23 IU/L (ref 0–40)
Albumin/Globulin Ratio: 2.2 (ref 1.2–2.2)
Albumin: 4.4 g/dL (ref 3.7–4.7)
Alkaline Phosphatase: 53 IU/L (ref 44–121)
BUN/Creatinine Ratio: 22 (ref 10–24)
BUN: 26 mg/dL (ref 8–27)
Bilirubin Total: 0.7 mg/dL (ref 0.0–1.2)
CO2: 25 mmol/L (ref 20–29)
Calcium: 9.9 mg/dL (ref 8.6–10.2)
Chloride: 101 mmol/L (ref 96–106)
Creatinine, Ser: 1.19 mg/dL (ref 0.76–1.27)
Globulin, Total: 2 g/dL (ref 1.5–4.5)
Glucose: 172 mg/dL — ABNORMAL HIGH (ref 65–99)
Potassium: 3.6 mmol/L (ref 3.5–5.2)
Sodium: 141 mmol/L (ref 134–144)
Total Protein: 6.4 g/dL (ref 6.0–8.5)
eGFR: 64 mL/min/{1.73_m2} (ref >59)

## 2020-12-24 LAB — LIPID PANEL
Chol/HDL Ratio: 2.9 ratio (ref 0.0–5.0)
Cholesterol, Total: 132 mg/dL (ref 100–199)
HDL: 45 mg/dL (ref >39)
LDL Chol Calc (NIH): 65 mg/dL (ref 0–99)
Triglycerides: 126 mg/dL (ref 0–149)
VLDL Cholesterol Cal: 22 mg/dL (ref 5–40)

## 2020-12-24 LAB — HEMOGLOBIN A1C
Est. average glucose Bld gHb Est-mCnc: 183 mg/dL
Hgb A1c MFr Bld: 8 % — ABNORMAL HIGH (ref 4.8–5.6)

## 2020-12-24 NOTE — Telephone Encounter (Signed)
Patient advised. He agreed to [facility] referral.

## 2020-12-24 NOTE — Telephone Encounter (Signed)
-----   Message from Virginia Crews, MD sent at 12/24/2020  8:08 AM EDT ----- Normal labs, except for high A1c. Continue with diabetes med changes. Offer CCM PharmD referral again.

## 2020-12-28 ENCOUNTER — Telehealth: Payer: Self-pay | Admitting: *Deleted

## 2020-12-28 NOTE — Chronic Care Management (AMB) (Signed)
  Chronic Care Management   Outreach Note  12/28/2020 Name: Ivan Kennedy MRN: [id number] DOB: 1946-03-17  TY BUNTROCK is a 75 y.o. year old male who is a primary care patient of Brita Romp, Dionne Bucy, MD. I reached out to Soyla Dryer by phone today in response to a referral sent by Mr. Marques Ericson Starks's PCP, Virginia Crews, MD.     An unsuccessful telephone outreach was attempted today. The patient was referred to the case management team for assistance with care management and care coordination.   Follow Up Plan: A HIPAA compliant phone message was left for the patient providing contact information and requesting a return call. The care management team will reach out to the patient again over the next 7 days.  If patient returns call to provider office, please advise to call [place] at [phone].  [place]

## 2020-12-29 NOTE — Chronic Care Management (AMB) (Signed)
  Chronic Care Management   Note  12/29/2020 Name: WILLIM TURNAGE MRN: [id number] DOB: 1946/03/03  LELAN CUSH is a 75 y.o. year old male who is a primary care patient of Virginia Crews, MD. I reached out to Soyla Dryer by phone today in response to a referral sent by Mr. Uriel Dowding Mosher's PCP, Virginia Crews, MD.     Mr. Stacks was given information about Chronic Care Management services today including:  1. CCM service includes personalized support from designated clinical staff supervised by his physician, including individualized plan of care and coordination with other care providers 2. 24/7 contact phone numbers for assistance for urgent and routine care needs. 3. Service will only be billed when office clinical staff spend 20 minutes or more in a month to coordinate care. 4. Only one practitioner may furnish and bill the service in a calendar month. 5. The patient may stop CCM services at any time (effective at the end of the month) by phone call to the office staff. 6. The patient will be responsible for cost sharing (co-pay) of up to 20% of the service fee (after annual deductible is met).  Patient agreed to services and verbal consent obtained.   Follow up plan: Telephone appointment with care management team member scheduled for:01/04/2021  [place] [facility]

## 2020-12-30 ENCOUNTER — Telehealth: Payer: Self-pay | Admitting: Family Medicine

## 2020-12-30 NOTE — Telephone Encounter (Signed)
Ok to stop metformin and then f/u with Pharmacist next week as scheduled

## 2020-12-30 NOTE — Telephone Encounter (Signed)
Please review last office note and advise. KW 

## 2020-12-30 NOTE — Telephone Encounter (Signed)
Pt is calling to let Dr. B know that the metFORMIN (GLUCOPHAGE XR) 500 MG 24 hr tablet [[id number]] is not working his stomach is bothering him with diarrhea. The diarrhea started Monday 11/27/20. Pt would like to know can he stop taking the medicine. Preferred Pharmacy- [facility], [place]

## 2020-12-31 ENCOUNTER — Telehealth: Payer: Self-pay

## 2020-12-31 NOTE — Progress Notes (Signed)
Chronic Care Management Pharmacy Assistant   Name: Ivan Kennedy  MRN: [id number] DOB: [birth year]  Chart Review for Clinical Pharmaisct  on 01/04/2021.   Conditions to be addressed/monitored: HTN, HLD, DMII and Acid reflux, IBS,Vertigo, Osteoarthritis of knee, B12 deficiency, Rosacea  Primary concerns for visit include: Diabetes medication   Recent office visits:  12/30/2020 Dr.Bacigalupo MD (PCP)-Stop Metformin XR 500 mg 12/23/2020 Dr.Bacigalupo MD (PCP)- Stop Januvia 100 mg, start Metformin XR 500 mg 1 tablet  daily with breakfast, start Meclizine 25 mg PRN 09/24/2020 [name] PA-C (PCP)-  Start Rybelsus Take 7 mg by mouth daily,Completed Course of Potassium Chloride. 07/20/2020 [name] PA-C (PCP)- Updated directions sent in for Glimepiride for  4 mg 1 tablet twice a day. 07/16/2020 [name] PA-C (PCP) Referral placed for Dr.Howard Sabra Heck at [facility].   Recent consult visits:  11/04/2020 Dr. Aubery Lapping MD (Dermatology) -No medication Change Noted 08/16/2020 Rivka Barbara Physical Therapist (Physical Therapy)- No medication change noted 08/16/2020 Darcey Nora Physical Therapist (Physical Therapy)- No medication change noted. 07/27/2020 Philis Nettle MD (Dermatology) - No medication Change Noted 07/26/2020 Carlynn Spry PA [facility]) -No medication change noted. 07/26/2020 Dr. Sabra Heck MD  (Ortopedic Surgery) - No medication changes noted.  Hospital visits:  None in previous 6 months  Have you seen any other providers since your last visit? no Any changes in your medications or health? no Any side effects from any medications? no Do you have an symptoms or problems not managed by your medications? no Any concerns about your health right now? Yes  Patient states he is concern about his diabetes.Patient states he was taking metformin in the past and was doing well with steady blood sugar readings.Patient reports he  started having issue with his stomach and was told by his Gastroenterologist to stop taking metformin.Patient states his symptoms improved.Patient reports his blood sugar were not  so good while beginning on different medication as in Januvia , so his PCP started him back on metformin.Patient states he started having stomach issue again and he stop taking the metformin.Patient is asking if there is something he can take or what he can do to help with his diabetes. Patient states he is unsure if his Rybelsus is working for him.  Has your provider asked that you check blood pressure, blood sugar, or follow special diet at home? Yes  Patient reports his blood sugar is ranging from 130-200.Patient denies his blood sugar dropping below 70.  Patient states he check his blood pressure once in a while. Do you get any type of exercise on a regular basis? No  Patient states he does not exercise, but he does a lot of manual labor.  Can you think of a goal you would like to reach for your health? None ID Do you have any problems getting your medications? no Is there anything that you would like to discuss during the appointment?   Diabetes medication  Please bring medications and supplements to appointment   Medications: Outpatient Encounter Medications as of 12/31/2020  Medication Sig Note  . aspirin EC 81 MG tablet Take 81 mg by mouth daily.  11/09/2015: Received from: [place]  . Blood Glucose Monitoring Suppl (ONE TOUCH ULTRA 2) w/Device KIT To check blood sugar once daily   . glimepiride (AMARYL) 4 MG tablet Take 1 tablet (4 mg total) by mouth in the morning and at bedtime.   . hydrochlorothiazide (HYDRODIURIL) 25 MG tablet Take 1 tablet (25 mg total)  by mouth daily.   . Lancets (ACCU-CHEK SOFT TOUCH) lancets To check blood glucose daily   . loratadine-pseudoephedrine (CLARITIN-D 24-HOUR) 10-240 MG per 24 hr tablet Take 1 tablet by mouth daily as needed for allergies.   Marland Kitchen losartan  (COZAAR) 100 MG tablet Take 1 tablet (100 mg total) by mouth daily.   . meclizine (ANTIVERT) 25 MG tablet Take 1 tablet (25 mg total) by mouth 3 (three) times daily as needed for dizziness.   . metFORMIN (GLUCOPHAGE XR) 500 MG 24 hr tablet Take 1 tablet (500 mg total) by mouth daily with breakfast.   . metoprolol succinate (TOPROL-XL) 25 MG 24 hr tablet Take 1 tablet (25 mg total) by mouth daily.   . metroNIDAZOLE (METROGEL) 1 % gel APPLY TO ROSACEA ONCE DAILY. 04/21/2015: Received from: External Pharmacy  . omeprazole (PRILOSEC) 40 MG capsule TAKE 1 CAPSULE BY MOUTH EVERY DAY   . ONETOUCH ULTRA test strip CHECK BLOOD SUGAR ONCE DAILY   . pioglitazone (ACTOS) 45 MG tablet Take 1 tablet (45 mg total) by mouth daily.   . Semaglutide (RYBELSUS) 7 MG TABS Take 7 mg by mouth daily.   . simvastatin (ZOCOR) 20 MG tablet TAKE 1 TABLET BY MOUTH EVERY DAY IN THE EVENING   . tretinoin (RETIN-A) 0.05 % cream Apply 1 application topically at bedtime.   . vitamin B-12 (CYANOCOBALAMIN) 1000 MCG tablet Take 1,000 mcg by mouth daily.    No facility-administered encounter medications on file as of 12/31/2020.    Star Rating Drugs: Losartan 100 mg [facility] Simvastatin 20 mg last filled on 12/24/2020 [facility]. Glimepiride 4 mg last filled on 12/06/2020 [facility] Metformin 500 mg last filled on 12/23/2020 for 30 day supply at [facility] (not taking). Pioglitazone 45 mg last filled on 11/12/2020 [facility]. Semaglutide 7 mg last filled on 12/12/2020 [facility].   [place] Pharmacist Assistant [phone]

## 2020-12-31 NOTE — Telephone Encounter (Signed)
Patient has advised. KW

## 2021-01-03 ENCOUNTER — Telehealth: Payer: Self-pay

## 2021-01-03 NOTE — Progress Notes (Signed)
Spoke to patient to confirmed patient telephone appointment on 01/04/2021 for CCM at 2:45 pm with [name] the Clinical pharmacist.   Patient Verbalized understanding.  Have you had any recent office visit or specialist visit outside of [facility]? No  Are there any concerns you would like to discuss during your office visit?     Patient states he is concern about his diabetes.Patient states he was taking metformin in the past and was doing well with steady blood sugar readings.Patient reports he started having issue with his stomach and was told by his Gastroenterologist to stop taking metformin.Patient states his symptoms improved.Patient reports his blood sugar were not  so good while beginning on different medication as in Januvia , so his PCP started him back on metformin.Patient states he started having stomach issue again and he stop taking the metformin.Patient is asking if there is something he can take or what he can do to help with his diabetes. Patient states he is unsure if his Rybelsus is working for him.   Star Rating Drugs: Losartan 100 mg last filled [facility]11/2020 [facility] Simvastatin 20 mg last filled on 12/24/2020 [facility]. Glimepiride 4 mg last filled on 12/06/2020 [facility] Metformin 500 mg last filled on 12/23/2020 for 30 day supply at [facility] (not taking). Pioglitazone 45 mg last filled on 11/12/2020 [facility]. Semaglutide 7 mg last filled on 12/12/2020 [facility].  [place] Pharmacist Assistant [phone]

## 2021-01-04 ENCOUNTER — Ambulatory Visit (INDEPENDENT_AMBULATORY_CARE_PROVIDER_SITE_OTHER): Payer: PPO

## 2021-01-04 DIAGNOSIS — E1165 Type 2 diabetes mellitus with hyperglycemia: Secondary | ICD-10-CM

## 2021-01-04 DIAGNOSIS — E1169 Type 2 diabetes mellitus with other specified complication: Secondary | ICD-10-CM | POA: Diagnosis not present

## 2021-01-04 DIAGNOSIS — E785 Hyperlipidemia, unspecified: Secondary | ICD-10-CM

## 2021-01-04 DIAGNOSIS — E1159 Type 2 diabetes mellitus with other circulatory complications: Secondary | ICD-10-CM | POA: Diagnosis not present

## 2021-01-04 DIAGNOSIS — R42 Dizziness and giddiness: Secondary | ICD-10-CM

## 2021-01-04 DIAGNOSIS — K219 Gastro-esophageal reflux disease without esophagitis: Secondary | ICD-10-CM

## 2021-01-04 DIAGNOSIS — I152 Hypertension secondary to endocrine disorders: Secondary | ICD-10-CM

## 2021-01-04 MED ORDER — RYBELSUS 14 MG PO TABS: 14.0000 mg | ORAL_TABLET | Freq: Every day | ORAL | 0 refills | Status: DC

## 2021-01-04 NOTE — Progress Notes (Signed)
Chronic Care Management Pharmacy Note  01/11/2021 Name:  Ivan Kennedy MRN:  [id number] DOB:  03-16-1946  Subjective: Ivan Kennedy is an 75 y.o. year old male who is a primary patient of Bacigalupo, Dionne Bucy, MD.  The CCM team was consulted for assistance with disease management and care coordination needs.    Engaged with patient by telephone for initial visit in response to provider referral for pharmacy case management and/or care coordination services.   Consent to Services:  The patient was given the following information about Chronic Care Management services today, agreed to services, and gave verbal consent: 1. CCM service includes personalized support from designated clinical staff supervised by the primary care provider, including individualized plan of care and coordination with other care providers 2. 24/7 contact phone numbers for assistance for urgent and routine care needs. 3. Service will only be billed when office clinical staff spend 20 minutes or more in a month to coordinate care. 4. Only one practitioner may furnish and bill the service in a calendar month. 5.The patient may stop CCM services at any time (effective at the end of the month) by phone call to the office staff. 6. The patient will be responsible for cost sharing (co-pay) of up to 20% of the service fee (after annual deductible is met). Patient agreed to services and consent obtained.  Patient Care Team: Virginia Crews, MD as PCP - General (Family Medicine) Arelia Sneddon, [place] as Consulting Physician (Optometry) Jannet Mantis, MD as Consulting Physician (Dermatology) Jonathon Bellows, MD as Consulting Physician (Gastroenterology) Germaine Pomfret, [facility] (Pharmacist)  Recent office visits: 12/30/2020 Dr.Bacigalupo MD (PCP)-Stop Metformin XR 500 mg 12/23/2020 Dr.Bacigalupo MD (PCP)- Stop Januvia 100 mg, start Metformin XR 500 mg 1 tablet  daily with breakfast, start Meclizine 25 mg PRN 09/24/2020  [name] PA-C (PCP)-  Start Rybelsus Take 7 mg by mouth daily,Completed Course of Potassium Chloride. 07/20/2020 [name] PA-C (PCP)- Updated directions sent in for Glimepiride for  4 mg 1 tablet twice a day. 07/16/2020 [name] PA-C (PCP) Referral placed for Dr.Howard Sabra Heck at [facility].  Recent consult visits: 11/04/2020 Dr. Aubery Lapping MD (Dermatology) -No medication Change Noted 08/16/2020 Rivka Barbara Physical Therapist (Physical Therapy)- No medication change noted 08/16/2020 Darcey Nora Physical Therapist (Physical Therapy)- No medication change noted. 07/27/2020 Philis Nettle MD (Dermatology) - No medication Change Noted 07/26/2020 Carlynn Spry PA [facility]) -No medication change noted. 07/26/2020 Dr. Sabra Heck MD  (Ortopedic Surgery) - No medication changes noted.  Hospital visits: None in previous 6 months  Objective:  Lab Results  Component Value Date   CREATININE 1.19 12/23/2020   BUN 26 12/23/2020   GFRNONAA 73 11/06/2019   GFRAA 84 11/06/2019   NA 141 12/23/2020   K 3.6 12/23/2020   CALCIUM 9.9 12/23/2020   CO2 25 12/23/2020   GLUCOSE 172 (H) 12/23/2020    Lab Results  Component Value Date/Time   HGBA1C 8.0 (H) 12/23/2020 09:32 AM   HGBA1C 8.3 (A) 09/24/2020 08:24 AM   HGBA1C 8.6 (A) 06/23/2020 07:46 AM   HGBA1C 7.2 (H) 11/06/2019 09:32 AM   MICROALBUR 20 04/24/2018 08:29 AM   MICROALBUR 50 11/02/2015 11:23 AM    Last diabetic Eye exam:  Lab Results  Component Value Date/Time   HMDIABEYEEXA No Retinopathy 02/24/2020 12:00 AM    Last diabetic Foot exam: No results found for: HMDIABFOOTEX   Lab Results  Component Value Date   CHOL 132 12/23/2020   HDL 45 12/23/2020  LDLCALC 65 12/23/2020   TRIG 126 12/23/2020   CHOLHDL 2.9 12/23/2020    Hepatic Function Latest Ref Rng & Units 12/23/2020 11/06/2019 10/30/2018  Total Protein 6.0 - 8.5 g/dL 6.4 6.5 5.9(L)  Albumin 3.7 - 4.7 g/dL 4.4 4.4 4.3  AST  0 - 40 IU/L 23 29 26   ALT 0 - 44 IU/L 25 38 33  Alk Phosphatase 44 - 121 IU/L 53 58 57  Total Bilirubin 0.0 - 1.2 mg/dL 0.7 0.9 0.9    Lab Results  Component Value Date/Time   TSH 1.650 10/30/2018 10:17 AM   TSH 1.460 10/30/2017 08:56 AM    CBC Latest Ref Rng & Units 11/06/2019 10/30/2018 10/30/2017  WBC 3.4 - 10.8 x10E3/uL 6.5 6.1 5.4  Hemoglobin 13.0 - 17.7 g/dL 13.5 13.8 14.2  Hematocrit 37.5 - 51.0 % 38.2 39.7 41.6  Platelets 150 - 450 x10E3/uL 172 150 145(L)    No results found for: VD25OH  Clinical ASCVD: No  The 10-year ASCVD risk score Mikey Bussing DC Jr., et al., 2013) is: 41.7%   Values used to calculate the score:     Age: 37 years     Sex: Male     Is Non-Hispanic African American: No     Diabetic: Yes     Tobacco smoker: No     Systolic Blood Pressure: 767 mmHg     Is BP treated: Yes     HDL Cholesterol: 45 mg/dL     Total Cholesterol: 132 mg/dL    Depression screen [facility] 2/9 12/23/2020 10/23/2019 10/17/2018  Decreased Interest 0 0 0  Down, Depressed, Hopeless 0 0 0  PHQ - 2 Score 0 0 0  Altered sleeping 0 - 0  Tired, decreased energy 1 - 1  Change in appetite 0 - 0  Feeling bad or failure about yourself  0 - 0  Trouble concentrating 0 - 0  Moving slowly or fidgety/restless 0 - 0  Suicidal thoughts 0 - 0  PHQ-9 Score 1 - 1  Difficult doing work/chores Not difficult at all - Not difficult at all    Social History   Tobacco Use  Smoking Status Never Smoker  Smokeless Tobacco Never Used   BP Readings from Last 3 Encounters:  12/23/20 135/75  09/24/20 134/83  06/23/20 119/78   Pulse Readings from Last 3 Encounters:  12/23/20 62  09/24/20 68  06/23/20 77   Wt Readings from Last 3 Encounters:  12/23/20 242 lb (109.8 kg)  09/24/20 244 lb (110.7 kg)  06/23/20 238 lb 12.8 oz (108.3 kg)   BMI Readings from Last 3 Encounters:  12/23/20 32.37 kg/m  09/24/20 33.09 kg/m  06/23/20 32.39 kg/m    Assessment/Interventions: Review of patient past medical  history, allergies, medications, health status, including review of consultants reports, laboratory and other test data, was performed as part of comprehensive evaluation and provision of chronic care management services.   SDOH:  (Social Determinants of Health) assessments and interventions performed: Yes SDOH Interventions   Flowsheet Row Most Recent Value  SDOH Interventions   Financial Strain Interventions Intervention Not Indicated     SDOH Screenings   Alcohol Screen: Low Risk   . Last Alcohol Screening Score (AUDIT): 2  Depression (PHQ2-9): Low Risk   . PHQ-2 Score: 1  Financial Resource Strain: Medium Risk  . Difficulty of Paying Living Expenses: Somewhat hard  Food Insecurity: Not on file  Housing: Not on file  Physical Activity: Not on file  Social Connections: Not on  file  Stress: Not on file  Tobacco Use: Low Risk   . Smoking Tobacco Use: Never Smoker  . Smokeless Tobacco Use: Never Used  Transportation Needs: Not on file    [place]  No Known Allergies  Medications Reviewed Today    Reviewed by Germaine Pomfret, [facility] (Pharmacist) on 01/04/21 at 45  Med List Status: <None>  Medication Order Taking? Sig Documenting Provider Last Dose Status Informant  aspirin EC 81 MG tablet [id number] Yes Take 81 mg by mouth daily.  [provider] Taking Active            Med Note Wilson Singer   Tue Nov 09, 2015 10:46 AM) Received from: [place]  Blood Glucose Monitoring Suppl (ONE TOUCH ULTRA 2) w/Device KIT [id number]  To check blood sugar once daily [name] M, [place]  Active   doxycycline (VIBRAMYCIN) 50 MG capsule [id number] Yes Take 50 mg by mouth 2 (two) times daily. [provider] Taking Active            Med Note Michaelle Birks, Cathe Mons A   Tue Jan 04, 2021  3:18 PM) Prescribed by Dr. Aubery Lapping  glimepiride (AMARYL) 4 MG tablet [id number] Yes Take 1 tablet (4 mg total) by mouth in the morning and at bedtime.  Mar Daring, PA-C Taking Active   hydrochlorothiazide (HYDRODIURIL) 25 MG tablet [id number] Yes Take 1 tablet (25 mg total) by mouth daily. Mar Daring, PA-C Taking Active   Lancets (ACCU-CHEK SOFT TOUCH) lancets [id number]  To check blood glucose daily [name] M, [place]  Active   loratadine-pseudoephedrine (CLARITIN-D 24-HOUR) 10-240 MG per 24 hr tablet [id number] Yes Take 1 tablet by mouth daily as needed for allergies. [provider] Taking Active   losartan (COZAAR) 100 MG tablet [id number] Yes Take 1 tablet (100 mg total) by mouth daily. Mar Daring, PA-C Taking Active   meclizine (ANTIVERT) 25 MG tablet [id number] No Take 1 tablet (25 mg total) by mouth 3 (three) times daily as needed for dizziness.  Patient not taking: Reported on 01/04/2021   Virginia Crews, MD Not Taking Active   metoprolol succinate (TOPROL-XL) 25 MG 24 hr tablet [id number] Yes Take 1 tablet (25 mg total) by mouth daily. [name] M, PA-C Taking Active   metroNIDAZOLE (METROGEL) 1 % gel [id number] Yes Apply 1 application topically every morning. [provider] Taking Active            Med Note Wilmon Arms Apr 21, 2015  2:29 PM) Received from: External Pharmacy  omeprazole (PRILOSEC) 40 MG capsule [id number] Yes TAKE 1 CAPSULE BY MOUTH EVERY DAY Mar Daring, PA-C Taking Active   [facility] ULTRA test strip [id number]  CHECK BLOOD SUGAR ONCE DAILY Mar Daring, PA-C  Active   pioglitazone (ACTOS) 45 MG tablet [id number] Yes Take 1 tablet (45 mg total) by mouth daily. [name] M, PA-C Taking Active   Semaglutide (RYBELSUS) 14 MG TABS [id number] Yes Take 14 mg by mouth daily before breakfast. Take on an empty stomach with a sip of water Virginia Crews, MD  Active   simvastatin (ZOCOR) 20 MG tablet [id number] Yes TAKE 1 TABLET BY MOUTH EVERY DAY IN THE EVENING Bacigalupo, Dionne Bucy, MD Taking Active   tretinoin (RETIN-A) 0.05 %  cream [id number] Yes Apply 1 application topically at bedtime. Mar Daring, PA-C Taking Active   vitamin B-12 (CYANOCOBALAMIN) 1000 MCG tablet [id number] Yes Take  1,000 mcg by mouth daily. [provider] Taking Active           Patient Active Problem List   Diagnosis Date Noted  . Benign paroxysmal positional vertigo 12/23/2020  . Hallux valgus 07/31/2018  . IBS (irritable bowel syndrome) 01/21/2015  . B12 deficiency 01/07/2015  . Hyperlipidemia associated with type 2 diabetes mellitus ([place]) 01/07/2015  . Cannot sleep 01/07/2015  . Fatty liver disease, nonalcoholic 59/93/5701  . Osteoarthritis of knee 01/07/2015  . Acne erythematosa 01/07/2015  . Disorder of male genital organs 01/07/2015  . Atypical chest pain 10/09/2014  . Acid reflux 09/06/2009  . Diabetes mellitus, type 2 ([place]) 11/20/2008  . Colon, diverticulosis 11/20/2008  . Hypertension associated with diabetes ([place]) 11/20/2008  . Apnea, sleep 11/20/2008  . History of artificial joint 11/20/2008    Immunization History  Administered Date(s) Administered  . Fluad Quad(high Dose 65+) 05/08/2019, 06/23/2020  . Hepatitis B, adult 05/29/2012, 07/29/2012, 03/18/2013  . Influenza, High Dose Seasonal PF 07/06/2014, 05/06/2015, 07/11/2016, 04/26/2017, 04/24/2018  . PFIZER(Purple Top)SARS-COV-2 Vaccination 09/29/2019, 10/28/2019  . Pneumococcal Conjugate-13 07/06/2014  . Pneumococcal Polysaccharide-23 05/29/2012, 10/30/2018    Conditions to be addressed/monitored:  Hypertension, Hyperlipidemia, Diabetes, GERD, Osteoarthritis and IBS  Care Plan : General Pharmacy (Adult)  Updates made by Germaine Pomfret, RPH since 01/11/2021 12:00 AM    Problem: Hypertension, Hyperlipidemia, Diabetes, GERD, Osteoarthritis and IBS   Priority: High    Long-Range Goal: Patient-Specific Goal   Start Date: 01/04/2021  Expected End Date: 07/07/2021  This Visit's Progress: On track  Priority: High  Note:   Current  Barriers:  . Unable to achieve control of diabetes   Pharmacist Clinical Goal(s):  Marland Kitchen Patient will achieve control of diabetes as evidenced by A1c less than 8% through collaboration with PharmD and provider.   Interventions: . 1:1 collaboration with Brita Romp Dionne Bucy, MD regarding development and update of comprehensive plan of care as evidenced by provider attestation and co-signature . Inter-disciplinary care team collaboration (see longitudinal plan of care) . Comprehensive medication review performed; medication list updated in electronic medical record  Hypertension (BP goal <140/90) -Controlled -Current treatment: . Hydrochlorothiazide 25 mg daily  . Losartan 100 mg daily  . Metoprolol XL 25 mg daily (skipped beats)  -Medications previously tried: NA  -Current home readings: NA -Denies hypotensive/hypertensive symptoms -Educated on Symptoms of hypotension and importance of maintaining adequate hydration; -Counseled to monitor BP at home weekly, document, and provide log at future appointments -Recommended to continue current medication  Hyperlipidemia: (LDL goal < 70) -Controlled -Current treatment: . Simvastatin 20 mg nightly  -Medications previously tried: NA  -Educated on Importance of limiting foods high in cholesterol; -Recommended to continue current medication  Diabetes (A1c goal <8%) -Uncontrolled -Current medications: . Glimepiride 4 mg twice daily   . Pioglitazone 45 mg daily  . Rybelsus 7 mg daily  -Medications previously tried: Metformin IR + ER (GI), Januvia (DDI with GLP-1), Invokana (dizziness), Trulicity, glipizide,    -Current home glucose readings . fasting glucose: 140, 180, 125 (typical range 150-220)  -Denies hypoglycemic/hyperglycemic symptoms -Current meal patterns: Avoids sweet drinks, desserts, . breakfast: Ham biscuit + water . lunch: Hot dog + Pack chips + water   . dinner: Country style steak + coleslaw + rice + green beans + biscuit +  water . Snacks: strawberries  . drinks: Primarily water. Zero Pepsi every other day,  -Current exercise: Active with farm work.  -Educated on A1c and blood sugar goals; -Counseled to check  feet daily and get yearly eye exams -Counseled on proper Rybelsus administration  -Recommended increasing Rybelsus to 14 mg daily   GERD (Goal: Prevent heartburn/reflux symptoms) -Controlled -Current treatment  . Omeprazole 40 mg daily  -Medications previously tried: NA  -Recommended to continue current medication  Allergic Rhinitis (Goal: Maintain symptom control) -Controlled -Current treatment   . Claritin-D 1 tablet daily  -Medications previously tried: NA -Counseled on risks of increased blood pressure with pseudoephedrine.  -Recommended switching to claritin daily   Patient Goals/Self-Care Activities . Patient will:  - check glucose daily, document, and provide at future appointments check blood pressure weekly, document, and provide at future appointments  Follow Up Plan: Telephone follow up appointment with care management team member scheduled for:  01/31/2021 at 3:00 PM      Medication Assistance: None required.  Patient affirms current coverage meets needs.  [facility]  [id number] [place] [id number]: [phone]: [phone] [place] [place]Phone: [phone]: [phone] [id number] [place], [id number]: [phone]: [phone] pill box? Yes Pt endorses 100% compliance  We discussed: Current pharmacy is preferred with insurance plan and patient is satisfied with pharmacy services Patient decided to: Continue current medication management strategy  Care Plan and Follow Up Patient Decision:   Patient agrees to Care Plan and Follow-up.  Plan: Telephone follow up appointment with care management team member scheduled for:  01/31/2021 at 3:00 PM  AlJunius ArgylePharmD, [phone]

## 2021-01-11 ENCOUNTER — Telehealth: Payer: Self-pay

## 2021-01-11 MED ORDER — MECLIZINE HCL 25 MG PO TABS: 25.0000 mg | ORAL_TABLET | Freq: Three times a day (TID) | ORAL | 0 refills | Status: DC | PRN

## 2021-01-11 NOTE — Progress Notes (Signed)
Patient states he is at the [facility] and he is having symptoms of dizziness.Patient reports he left his meclizine at his home.Patient is asking if the clinical pharmacist could send a prescription to one of the pharmacy he is hear, and if the pharmacist could give him a call to discuss his symptoms.Notfied clinical pharmacist.  Anderson Malta Clinical Pharmacist Assistant [phone]

## 2021-01-15 ENCOUNTER — Other Ambulatory Visit: Payer: Self-pay | Admitting: Family Medicine

## 2021-01-23 ENCOUNTER — Other Ambulatory Visit: Payer: Self-pay | Admitting: Physician Assistant

## 2021-01-23 DIAGNOSIS — I10 Essential (primary) hypertension: Secondary | ICD-10-CM

## 2021-01-24 ENCOUNTER — Telehealth: Payer: Self-pay

## 2021-01-24 NOTE — Progress Notes (Signed)
Per clinical Pharmacist , [facility] to check to see if patient has pick up new prescription of Rybelsus 14 mg, how many day supply and the date he pick it up.  [facility], Patient last filled for Rybelsus 14 mg was on 01/14/2021 for 30 day supply.Notified clinical Pharmacist.   Anderson Malta Clinical Pharmacist Assistant [phone]

## 2021-01-26 NOTE — Telephone Encounter (Signed)
Pt called back to report that he will run out completely by next week. He has five pills left, please advise.   [id number] [place] [place] - [address]  [address] [address]  Phone: [phone] Fax: [phone]

## 2021-01-28 ENCOUNTER — Telehealth: Payer: Self-pay

## 2021-01-28 NOTE — Progress Notes (Signed)
Spoke to patient to confirmed patient telephone appointment on 01/31/2021 for CCM at 3:00 pm with [name] the Clinical pharmacist.   Patient aware to have all medications, supplements, blood pressure and blood sugar logs to visit.  Questions: Have you had any recent office visit or specialist visit outside of [place]?No Are there any concerns you would like to discuss during your office visit?No   Star Rating Drugs: Losartan 100 mg last filled 06/072022 [facility] Simvastatin 20 mg last filled on 12/24/2020 for [facility]. Glimepiride 4 mg last filled on 12/06/2020 for [facility] day [facility] Metformin 500 mg last filled on 12/23/2020 for 30 day supply at [facility] (not taking). Pioglitazone 45 mg last filled on 11/12/2020 [facility]. Semaglutide 7 mg last filled on 01/12/2021 for [facility] day [facility] at [facility].   [place] Pharmacist Assistant [phone]

## 2021-01-31 ENCOUNTER — Ambulatory Visit (INDEPENDENT_AMBULATORY_CARE_PROVIDER_SITE_OTHER): Payer: PPO

## 2021-01-31 DIAGNOSIS — E1169 Type 2 diabetes mellitus with other specified complication: Secondary | ICD-10-CM | POA: Diagnosis not present

## 2021-01-31 DIAGNOSIS — E1159 Type 2 diabetes mellitus with other circulatory complications: Secondary | ICD-10-CM | POA: Diagnosis not present

## 2021-01-31 DIAGNOSIS — E1165 Type 2 diabetes mellitus with hyperglycemia: Secondary | ICD-10-CM

## 2021-01-31 DIAGNOSIS — I152 Hypertension secondary to endocrine disorders: Secondary | ICD-10-CM

## 2021-01-31 DIAGNOSIS — E785 Hyperlipidemia, unspecified: Secondary | ICD-10-CM

## 2021-01-31 NOTE — Progress Notes (Signed)
Chronic Care Management Pharmacy Note  01/31/2021 Name:  Ivan Kennedy MRN:  [id number] DOB:  1946/08/02  Subjective: Ivan Kennedy is an 75 y.o. year old male who is a primary patient of Bacigalupo, Dionne Bucy, MD.  The CCM team was consulted for assistance with disease management and care coordination needs.    Engaged with patient by telephone for follow up visit in response to provider referral for pharmacy case management and/or care coordination services.   Consent to Services:  The patient was given information about Chronic Care Management services, agreed to services, and gave verbal consent prior to initiation of services.  Please see initial visit note for detailed documentation.   Patient Care Team: Virginia Crews, MD as PCP - General (Family Medicine) Arelia Sneddon, [place] as Consulting Physician (Optometry) Jannet Mantis, MD as Consulting Physician (Dermatology) Jonathon Bellows, MD as Consulting Physician (Gastroenterology) Germaine Pomfret, [facility] (Pharmacist)  Recent office visits: 12/30/2020 Dr.Bacigalupo MD (PCP)-Stop Metformin XR 500 mg 12/23/2020 Dr.Bacigalupo MD (PCP)- Stop Januvia 100 mg, start Metformin XR 500 mg 1 tablet  daily with breakfast, start Meclizine 25 mg PRN 09/24/2020 [name] PA-C (PCP)-  Start Rybelsus Take 7 mg by mouth daily,Completed Course of Potassium Chloride. 07/20/2020 [name] PA-C (PCP)- Updated directions sent in for Glimepiride for  4 mg 1 tablet twice a day. 07/16/2020 [name] PA-C (PCP) Referral placed for Dr.Howard Sabra Heck at [facility]  Recent consult visits: 11/04/2020 Dr. Aubery Lapping MD (Dermatology) -No medication Change Noted 08/16/2020 Rivka Barbara Physical Therapist (Physical Therapy)- No medication change noted 08/16/2020 Darcey Nora Physical Therapist (Physical Therapy)- No medication change noted. 07/27/2020 Philis Nettle MD (Dermatology) - No medication  Change Noted 07/26/2020 Carlynn Spry PA [facility]) -No medication change noted. 07/26/2020 Dr. Sabra Heck MD  (Ortopedic Surgery) - No medication changes noted.  Hospital visits: None in previous 6 months  Objective:  Lab Results  Component Value Date   CREATININE 1.19 12/23/2020   BUN 26 12/23/2020   GFRNONAA 73 11/06/2019   GFRAA 84 11/06/2019   NA 141 12/23/2020   K 3.6 12/23/2020   CALCIUM 9.9 12/23/2020   CO2 25 12/23/2020   GLUCOSE 172 (H) 12/23/2020    Lab Results  Component Value Date/Time   HGBA1C 8.0 (H) 12/23/2020 09:32 AM   HGBA1C 8.3 (A) 09/24/2020 08:24 AM   HGBA1C 8.6 (A) 06/23/2020 07:46 AM   HGBA1C 7.2 (H) 11/06/2019 09:32 AM   MICROALBUR 20 04/24/2018 08:29 AM   MICROALBUR 50 11/02/2015 11:23 AM    Last diabetic Eye exam:  Lab Results  Component Value Date/Time   HMDIABEYEEXA No Retinopathy 02/24/2020 12:00 AM    Last diabetic Foot exam: No results found for: HMDIABFOOTEX   Lab Results  Component Value Date   CHOL 132 12/23/2020   HDL 45 12/23/2020   LDLCALC 65 12/23/2020   TRIG 126 12/23/2020   CHOLHDL 2.9 12/23/2020    Hepatic Function Latest Ref Rng & Units 12/23/2020 11/06/2019 10/30/2018  Total Protein 6.0 - 8.5 g/dL 6.4 6.5 5.9(L)  Albumin 3.7 - 4.7 g/dL 4.4 4.4 4.3  AST 0 - 40 IU/L 23 29 26   ALT 0 - 44 IU/L 25 38 33  Alk Phosphatase 44 - 121 IU/L 53 58 57  Total Bilirubin 0.0 - 1.2 mg/dL 0.7 0.9 0.9    Lab Results  Component Value Date/Time   TSH 1.650 10/30/2018 10:17 AM   TSH 1.460 10/30/2017 08:56 AM    CBC Latest Ref Rng &  Units 11/06/2019 10/30/2018 10/30/2017  WBC 3.4 - 10.8 x10E3/uL 6.5 6.1 5.4  Hemoglobin 13.0 - 17.7 g/dL 13.5 13.8 14.2  Hematocrit 37.5 - 51.0 % 38.2 39.7 41.6  Platelets 150 - 450 x10E3/uL 172 150 145(L)    No results found for: VD25OH  Clinical ASCVD: No  The 10-year ASCVD risk score Mikey Bussing DC Jr., et al., 2013) is: 41.7%   Values used to calculate the score:     Age: 44 years     Sex: Male     Is  Non-Hispanic African American: No     Diabetic: Yes     Tobacco smoker: No     Systolic Blood Pressure: 697 mmHg     Is BP treated: Yes     HDL Cholesterol: 45 mg/dL     Total Cholesterol: 132 mg/dL    Depression screen [facility] 2/9 12/23/2020 10/23/2019 10/17/2018  Decreased Interest 0 0 0  Down, Depressed, Hopeless 0 0 0  PHQ - 2 Score 0 0 0  Altered sleeping 0 - 0  Tired, decreased energy 1 - 1  Change in appetite 0 - 0  Feeling bad or failure about yourself  0 - 0  Trouble concentrating 0 - 0  Moving slowly or fidgety/restless 0 - 0  Suicidal thoughts 0 - 0  PHQ-9 Score 1 - 1  Difficult doing work/chores Not difficult at all - Not difficult at all    Social History   Tobacco Use  Smoking Status Never  Smokeless Tobacco Never   BP Readings from Last 3 Encounters:  12/23/20 135/75  09/24/20 134/83  06/23/20 119/78   Pulse Readings from Last 3 Encounters:  12/23/20 62  09/24/20 68  06/23/20 77   Wt Readings from Last 3 Encounters:  12/23/20 242 lb (109.8 kg)  09/24/20 244 lb (110.7 kg)  06/23/20 238 lb 12.8 oz (108.3 kg)   BMI Readings from Last 3 Encounters:  12/23/20 32.37 kg/m  09/24/20 33.09 kg/m  06/23/20 32.39 kg/m    Assessment/Interventions: Review of patient past medical history, allergies, medications, health status, including review of consultants reports, laboratory and other test data, was performed as part of comprehensive evaluation and provision of chronic care management services.   SDOH:  (Social Determinants of Health) assessments and interventions performed: Yes   SDOH Screenings   Alcohol Screen: Low Risk    Last Alcohol Screening Score (AUDIT): 2  Depression (PHQ2-9): Low Risk    PHQ-2 Score: 1  Financial Resource Strain: Medium Risk   Difficulty of Paying Living Expenses: Somewhat hard  Food Insecurity: Not on file  Housing: Not on file  Physical Activity: Not on file  Social Connections: Not on file  Stress: Not on file  Tobacco  Use: Low Risk    Smoking Tobacco Use: Never   Smokeless Tobacco Use: Never  Transportation Needs: Not on file    [place]  No Known Allergies  Medications Reviewed Today     Reviewed by Germaine Pomfret, [place] (Pharmacist) on 01/04/21 at [place] List Status: <None>   Medication Order Taking? Sig Documenting Provider Last Dose Status Informant  aspirin EC 81 MG tablet [id number] Yes Take 81 mg by mouth daily.  [provider] Taking Active            Med Note Wilson Singer   Tue Nov 09, 2015 10:46 AM) Received from: [facility]  Blood Glucose Monitoring Suppl (ONE TOUCH ULTRA 2) w/Device Drucie Opitz [id number]  To check blood sugar once daily [name] M, [place]  Active   doxycycline (VIBRAMYCIN) 50 MG capsule [id number] Yes Take 50 mg by mouth 2 (two) times daily. [provider] Taking Active            Med Note Michaelle Birks, Cathe Mons A   Tue Jan 04, 2021  3:18 PM) Prescribed by Dr. Aubery Lapping  glimepiride (AMARYL) 4 MG tablet [id number] Yes Take 1 tablet (4 mg total) by mouth in the morning and at bedtime. Mar Daring, PA-C Taking Active   hydrochlorothiazide (HYDRODIURIL) 25 MG tablet [id number] Yes Take 1 tablet (25 mg total) by mouth daily. Mar Daring, PA-C Taking Active   Lancets (ACCU-CHEK SOFT TOUCH) lancets [id number]  To check blood glucose daily [name] M, [place]  Active   loratadine-pseudoephedrine (CLARITIN-D 24-HOUR) 10-240 MG per 24 hr tablet [id number] Yes Take 1 tablet by mouth daily as needed for allergies. [provider] Taking Active   losartan (COZAAR) 100 MG tablet [id number] Yes Take 1 tablet (100 mg total) by mouth daily. Mar Daring, PA-C Taking Active   meclizine (ANTIVERT) 25 MG tablet [id number] No Take 1 tablet (25 mg total) by mouth 3 (three) times daily as needed for dizziness.  Patient not taking: Reported on 01/04/2021   Virginia Crews, MD Not Taking Active    metoprolol succinate (TOPROL-XL) 25 MG 24 hr tablet [id number] Yes Take 1 tablet (25 mg total) by mouth daily. [name] M, PA-C Taking Active   metroNIDAZOLE (METROGEL) 1 % gel [id number] Yes Apply 1 application topically every morning. [provider] Taking Active            Med Note Wilmon Arms Apr 21, 2015  2:29 PM) Received from: External Pharmacy  omeprazole (PRILOSEC) 40 MG capsule [id number] Yes TAKE 1 CAPSULE BY MOUTH EVERY DAY Mar Daring, PA-C Taking Active   [facility] ULTRA test strip [id number]  CHECK BLOOD SUGAR ONCE DAILY Mar Daring, PA-C  Active   pioglitazone (ACTOS) 45 MG tablet [id number] Yes Take 1 tablet (45 mg total) by mouth daily. [name] M, PA-C Taking Active   Semaglutide (RYBELSUS) 14 MG TABS [id number] Yes Take 14 mg by mouth daily before breakfast. Take on an empty stomach with a sip of water Virginia Crews, MD  Active   simvastatin (ZOCOR) 20 MG tablet [id number] Yes TAKE 1 TABLET BY MOUTH EVERY DAY IN THE EVENING Bacigalupo, Dionne Bucy, MD Taking Active   tretinoin (RETIN-A) 0.05 % cream [id number] Yes Apply 1 application topically at bedtime. Mar Daring, PA-C Taking Active   vitamin B-12 (CYANOCOBALAMIN) 1000 MCG tablet [id number] Yes Take 1,000 mcg by mouth daily. [provider] Taking Active             Patient Active Problem List   Diagnosis Date Noted   Benign paroxysmal positional vertigo 12/23/2020   Hallux valgus 07/31/2018   IBS (irritable bowel syndrome) 01/21/2015   B12 deficiency 01/07/2015   Hyperlipidemia associated with type 2 diabetes mellitus ([place]) 01/07/2015   Cannot sleep 01/07/2015   Fatty liver disease, nonalcoholic 79/89/2119   Osteoarthritis of knee 01/07/2015   Acne erythematosa 01/07/2015   Disorder of male genital organs 01/07/2015   Atypical chest pain 10/09/2014   Acid reflux 09/06/2009   Diabetes mellitus, type 2 ([place]) 11/20/2008   Colon,  diverticulosis 11/20/2008   Hypertension associated with diabetes ([place]) 11/20/2008   Apnea, sleep 11/20/2008   History of artificial  joint 11/20/2008    Immunization History  Administered Date(s) Administered   Fluad Quad(high Dose 65+) 05/08/2019, 06/23/2020   Hepatitis B, adult 05/29/2012, 07/29/2012, 03/18/2013   Influenza, High Dose Seasonal PF 07/06/2014, 05/06/2015, 07/11/2016, 04/26/2017, 04/24/2018   PFIZER(Purple Top)SARS-COV-2 Vaccination 09/29/2019, 10/28/2019   Pneumococcal Conjugate-13 07/06/2014   Pneumococcal Polysaccharide-23 05/29/2012, 10/30/2018    Conditions to be addressed/monitored:  Hypertension, Hyperlipidemia, Diabetes, GERD, Osteoarthritis and IBS  There are no care plans that you recently modified to display for this patient.    Medication Assistance: None required.  Patient affirms current coverage meets needs.  Patient's preferred pharmacy [facility]:  [id number] [address]Phone: [phone]: [phone]Phone: [phone]: [phone] [id number] [place], [place]: [phone]: [phone] pill box? Yes Pt endorses 100% compliance  We discussed: Current pharmacy is preferred with insurance plan and patient is satisfied with pharmacy services Patient decided to: Continue current medication management strategy  Care Plan and Follow Up Patient Decision:  Patient agrees to Care Plan and Follow-up.  Plan: Telephone follow up appointment with care management team member scheduled for:  04/25/2021 at 3:00 PM  AlJunius ArgylePharmD, CPHawaiian [phone]

## 2021-02-02 DIAGNOSIS — G4733 Obstructive sleep apnea (adult) (pediatric): Secondary | ICD-10-CM | POA: Diagnosis not present

## 2021-02-06 ENCOUNTER — Other Ambulatory Visit: Payer: Self-pay | Admitting: Family Medicine

## 2021-02-06 DIAGNOSIS — E1165 Type 2 diabetes mellitus with hyperglycemia: Secondary | ICD-10-CM

## 2021-02-07 NOTE — Telephone Encounter (Signed)
   Future visit scheduled: yes   Notes to clinic: medication not assigned to a protocol, review manually    Requested Prescriptions  Pending Prescriptions Disp Refills   RYBELSUS 14 MG TABS [Pharmacy Med Name: RYBELSUS 14 MG TABLET] 30 tablet 0    Sig: TAKE 14 MG (1 TABLET) BY MOUTH DAILY BEFORE BREAKFAST. TAKE ON AN EMPTY STOMACH WITH A SIP OF WATER      Off-Protocol Failed - 02/06/2021  8:31 PM      Failed - Medication not assigned to a protocol, review manually.      Passed - Valid encounter within last 12 months    Recent Outpatient Visits           1 month ago Encounter for annual wellness visit (AWV) in Medicare patient   [facility] Grubbs, Dionne Bucy, MD   4 months ago Type 2 diabetes mellitus with hyperglycemia, without long-term current use of insulin [facility])   Shafter, Kevin, PA-C   7 months ago Type 2 diabetes mellitus with hyperglycemia, without long-term current use of insulin [facility])   [facility], Anderson [place] M, [place]   10 months ago Type 2 diabetes mellitus with hyperglycemia, without long-term current use of insulin [facility])   [facility] [name] M, [place]   11 months ago Type 2 diabetes mellitus with hyperglycemia, without long-term current use of insulin [facility])   [facility], Clearnce Sorrel, [place]       Future Appointments             In 1 month Bacigalupo, Dionne Bucy, MD [facility], [facility]

## 2021-02-18 DIAGNOSIS — G4733 Obstructive sleep apnea (adult) (pediatric): Secondary | ICD-10-CM | POA: Diagnosis not present

## 2021-02-21 ENCOUNTER — Ambulatory Visit: Payer: Self-pay | Admitting: *Deleted

## 2021-02-21 ENCOUNTER — Telehealth: Payer: Self-pay

## 2021-02-21 NOTE — Telephone Encounter (Signed)
Reason for Disposition  [1] Dizziness caused by heat exposure, sudden standing, or poor fluid intake AND [2] no improvement after 2 hours of rest and fluids    Dizziness with low BP readings, short of breath and tired.  Answer Assessment - Initial Assessment Questions 1. DESCRIPTION: "Describe your dizziness."     I'm having dizziness for 2 wks now.   Vision Blurred.   Tired and give out.   Maybe the medicine I don't know.   I'm on diabetes medicine started 3 months ago.    I don't have any energy.   2. LIGHTHEADED: "Do you feel lightheaded?" (e.g., somewhat faint, woozy, weak upon standing)     I feel like I could pass out sometimes.   I give out of breath when it happens the dizziness.   It does the most when I'm playing golf.   It happens a lot when I bend over.   I have a headache all the time.   A throbbing headache all the time.    My BP and sugar are fine.   I notice when I get up from sitting I'm dizzy.     3. VERTIGO: "Do you feel like either you or the room is spinning or tilting?" (i.e. vertigo)     No 4. SEVERITY: "How bad is it?"  "Do you feel like you are going to faint?" "Can you stand and walk?"   - MILD: Feels slightly dizzy, but walking normally.   - MODERATE: Feels unsteady when walking, but not falling; interferes with normal activities (e.g., school, work).   - SEVERE: Unable to walk without falling, or requires assistance to walk without falling; feels like passing out now.      Yes mild   When I  bend over to get my golf ball I see 2 balls and 2 holes. 5. ONSET:  "When did the dizziness begin?"     3-4 weeks 6. AGGRAVATING FACTORS: "Does anything make it worse?" (e.g., standing, change in head position)     Bending over 7. HEART RATE: "Can you tell me your heart rate?" "How many beats in 15 seconds?"  (Note: not all patients can do this)       92/78 BP   It's been in the 90's on the top number.   When they check it at the pharmacy it's low. 8. CAUSE: "What do you think  is causing the dizziness?"     I don't know 9. RECURRENT SYMPTOM: "Have you had dizziness before?" If Yes, ask: "When was the last time?" "What happened that time?"     No 10. OTHER SYMPTOMS: "Do you have any other symptoms?" (e.g., fever, chest pain, vomiting, diarrhea, bleeding)       See above No changes in BP 11. PREGNANCY: "Is there any chance you are pregnant?" "When was your last menstrual period?"       N/A  Protocols used: Dizziness - Lightheadedness-A-AH

## 2021-02-21 NOTE — Telephone Encounter (Signed)
Patient has made an additional call regarding their blood pressure, requesting to speak with a member of clinical staff when possible  Please contact further

## 2021-02-21 NOTE — Telephone Encounter (Signed)
Disregard.   Did not need to talk with pt.

## 2021-02-21 NOTE — Telephone Encounter (Signed)
Pt calling c/o having dizziness for 2 weeks now.  He feels tired and give out.   Short of breath and his vision is blurred when he bends over to like pick up his golf ball he sees 2 balls and 2 holes.   He really notices the dizziness when he is playing golf.   Bending over makes him really dizzy.  He has a constant throbbing headache that's all the time.   When he told me his BP readings they are on the low side.   See triage notes.   He said hs glucose is fine.   No new medications for at least 3 months pt per.    He really would rather see one of his doctors than go to the ED.   I let him know I would send a note seeing if he could be worked in.  I instructed him to go to the ED or call 911 if he gets worse or passes out.   He verbalized understanding.  I have sent a high priority note to [facility] to see if they can work him in as there are no available appts. Within the 4 hour timeframe indicated on the protocol.  I let the pt know he may need to go to the ED anyway.

## 2021-02-21 NOTE — Telephone Encounter (Signed)
Please review and advise. If you can please send it [facility] or [facility] nurse box. Thanks.

## 2021-02-21 NOTE — Telephone Encounter (Signed)
Needs to go to ER or urgent care.

## 2021-02-21 NOTE — Telephone Encounter (Signed)
Pt advised he states his dizziness is "not that bad" He went to the pharmacy this afternoon and is BP was 80/62.  He thinks he just needs to change his BP medications.  He says he is going to wait for Dr. B to return.   Thanks,   -Mickel Baas

## 2021-02-21 NOTE — Telephone Encounter (Signed)
Please see Nurse Triage call.   Thanks,   -Mickel Baas

## 2021-02-21 NOTE — Telephone Encounter (Signed)
Copied from [place] [phone]. Topic: General - Other >> Feb 21, 2021  2:20 PM Celene Kras wrote: Reason for CRM: Pt called stating that he received a call from the nurse asking him to get his BP levels. Attempted to get pt over to Nurse Triage and was advised that it needed to be sent to office. Attempted to call office x3 with no response.  Pt states that his BP is 82/60. Please advise.

## 2021-02-22 NOTE — Telephone Encounter (Signed)
Noted  

## 2021-02-22 NOTE — Telephone Encounter (Signed)
He should hold his HCTZ and monitor home BP.  Hydrate well.  If he has any chest pain or stroke symptoms, needs ED.  See if someone has appt to see him for BP in 1-2 weeks please.

## 2021-02-22 NOTE — Telephone Encounter (Signed)
Lmtcb okay for [facility] to advise patient of provider message below. KW

## 2021-02-22 NOTE — Telephone Encounter (Signed)
Pt given message from Dr Brita Romp dated 02/22/21 0741. Discussed signs and symptoms of CVA: weakness or numbness to either side of face, arms or legs. Dizziness, lightheadedness. Uneven smile, blurred, double or loss of vision, difficulty walking , difficulty talking.  Pt given signs of MI: chest pain, pain that refers to left arm, neck, jaw or through to the back, SOB, nausea and sweating.  Pt stated he feels much better. Pt did not take HCTZ and Losartan today, but stated he will resume Losartan tomorrow. Pt read back to hold HCTZ. BP today 146/93. Pt stated he will monitor BP and will call 911 if having and stroke or chest pain.  Pt given appt 03/03/21 for f/u BP with Vernie Murders PA.

## 2021-02-23 NOTE — Patient Instructions (Signed)
Visit Information It was great speaking with you today!  Please let me know if you have any questions about our visit.   Goals Addressed             This Visit's Progress    DIET - INCREASE WATER INTAKE   On track    Recommend increasing water intake to 4-6 glasses a day.      DIET - REDUCE CARBOHYDRATES   Not on track    Recommend to cut back on carbohydrates to 1 serving a day and to focus on eating more good carbohydrates.       Monitor and Manage My Blood Sugar-Diabetes Type 2   On track    Timeframe:  Long-Range Goal Priority:  High Start Date:  01/04/2021                            Expected End Date: 07/07/2021                      Follow Up Date 04/25/2021   - check blood sugar at prescribed times - check blood sugar if I feel it is too high or too low - enter blood sugar readings and medication or insulin into daily log    Why is this important?   Checking your blood sugar at home helps to keep it from getting very high or very low.  Writing the results in a diary or log helps the doctor know how to care for you.  Your blood sugar log should have the time, date and the results.  Also, write down the amount of insulin or other medicine that you take.  Other information, like what you ate, exercise done and how you were feeling, will also be helpful.     Notes:          Patient Care Plan: General Pharmacy (Adult)     Problem Identified: Hypertension, Hyperlipidemia, Diabetes, GERD, Osteoarthritis and IBS   Priority: High     Long-Range Goal: Patient-Specific Goal   Start Date: 01/04/2021  Expected End Date: 07/07/2021  This Visit's Progress: On track  Recent Progress: On track  Priority: High  Note:   Current Barriers:  Unable to achieve control of diabetes   Pharmacist Clinical Goal(s):  Patient will achieve control of diabetes as evidenced by A1c less than 8% through collaboration with PharmD and provider.   Interventions: 1:1 collaboration with  Virginia Crews, MD regarding development and update of comprehensive plan of care as evidenced by provider attestation and co-signature Inter-disciplinary care team collaboration (see longitudinal plan of care) Comprehensive medication review performed; medication list updated in electronic medical record  Hypertension (BP goal <140/90) -Controlled -Current treatment: Hydrochlorothiazide 25 mg daily  Losartan 100 mg daily  Metoprolol XL 25 mg daily (history of skipped beats)  -Medications previously tried: NA  -Current home readings: 90/65  -Reports hypotensive symptoms: dizziness  -Educated on Symptoms of hypotension and importance of maintaining adequate hydration; -Counseled to monitor BP at home weekly, document, and provide log at future appointments -Recommended to continue current medication  Hyperlipidemia: (LDL goal < 70) -Controlled -Current treatment: Simvastatin 20 mg nightly  -Current antiplatelet treatment: Aspirin 81 mg daily  -Medications previously tried: NA  -Educated on Importance of limiting foods high in cholesterol; -Recommended to continue current medication  Diabetes (A1c goal <8%) -Uncontrolled -Current medications: Glimepiride 4 mg twice daily   Pioglitazone 45 mg daily  Rybelsus 14 mg daily  -Medications previously tried: Metformin IR + ER (GI), Januvia (DDI with GLP-1), Invokana (dizziness), Trulicity, glipizide,    -Current home glucose readings  Fasting Comments  20-Jun 170   19-Jun 185 Father's day  18-Jun 129   17-Jun 146   16-Jun 165   15-Jun 138   14-Jun 101   13-Jun 157   11-Jun 155   10-Jun 157   9-Jun 166   8-Jun 131   7-Jun 108   6-Jun 217   Average 152   -Denies hypoglycemic/hyperglycemic symptoms -Current meal patterns: Avoids sweet drinks, desserts, breakfast: Ham biscuit + water lunch: Hot dog + Pack chips + water   dinner: Country style steak + coleslaw + rice + green beans + biscuit + water Snacks: strawberries   drinks: Primarily water. Zero Pepsi every other day,  -Current exercise: Active with farm work.  -Educated on A1c and blood sugar goals; -Counseled to check feet daily and get yearly eye exams -Counseled on proper Rybelsus administration  -Continue current medications  GERD (Goal: Prevent heartburn/reflux symptoms) -Controlled -Current treatment  Omeprazole 40 mg daily  -Medications previously tried: NA  -Recommended to continue current medication  Allergic Rhinitis (Goal: Maintain symptom control) -Controlled -Current treatment   Claritin 1 tablet daily  -Medications previously tried: NA -Counseled on risks of increased blood pressure with pseudoephedrine.  -Recommended switching to claritin daily   Patient Goals/Self-Care Activities Patient will:  - check glucose daily, document, and provide at future appointments check blood pressure weekly, document, and provide at future appointments  Follow Up Plan: Telephone follow up appointment with care management team member scheduled for:  04/25/2021 at 3:00 PM      Patient agreed to services and verbal consent obtained.   The patient verbalized understanding of instructions, educational materials, and care plan provided today and declined offer to receive copy of patient instructions, educational materials, and care plan.   [name], PharmD, CPP Clinical Pharmacist [facility] at [facility]  [phone]

## 2021-02-24 DIAGNOSIS — H2513 Age-related nuclear cataract, bilateral: Secondary | ICD-10-CM | POA: Diagnosis not present

## 2021-02-24 LAB — HM DIABETES EYE EXAM

## 2021-03-01 ENCOUNTER — Encounter: Payer: Self-pay | Admitting: Family Medicine

## 2021-03-02 ENCOUNTER — Other Ambulatory Visit: Payer: Self-pay | Admitting: Physician Assistant

## 2021-03-02 DIAGNOSIS — E1165 Type 2 diabetes mellitus with hyperglycemia: Secondary | ICD-10-CM

## 2021-03-03 ENCOUNTER — Other Ambulatory Visit: Payer: Self-pay

## 2021-03-03 ENCOUNTER — Ambulatory Visit (INDEPENDENT_AMBULATORY_CARE_PROVIDER_SITE_OTHER): Payer: PPO | Admitting: Family Medicine

## 2021-03-03 ENCOUNTER — Ambulatory Visit: Payer: PPO | Admitting: Family Medicine

## 2021-03-03 ENCOUNTER — Encounter: Payer: Self-pay | Admitting: Family Medicine

## 2021-03-03 VITALS — BP 132/80 | HR 73 | Temp 97.5°F | Ht 73.0 in | Wt 243.3 lb

## 2021-03-03 DIAGNOSIS — Z6832 Body mass index (BMI) 32.0-32.9, adult: Secondary | ICD-10-CM | POA: Diagnosis not present

## 2021-03-03 DIAGNOSIS — E1165 Type 2 diabetes mellitus with hyperglycemia: Secondary | ICD-10-CM

## 2021-03-03 DIAGNOSIS — I152 Hypertension secondary to endocrine disorders: Secondary | ICD-10-CM | POA: Diagnosis not present

## 2021-03-03 DIAGNOSIS — E785 Hyperlipidemia, unspecified: Secondary | ICD-10-CM

## 2021-03-03 DIAGNOSIS — E1169 Type 2 diabetes mellitus with other specified complication: Secondary | ICD-10-CM | POA: Diagnosis not present

## 2021-03-03 DIAGNOSIS — E1159 Type 2 diabetes mellitus with other circulatory complications: Secondary | ICD-10-CM | POA: Diagnosis not present

## 2021-03-03 DIAGNOSIS — E669 Obesity, unspecified: Secondary | ICD-10-CM | POA: Diagnosis not present

## 2021-03-03 MED ORDER — RYBELSUS 14 MG PO TABS: ORAL_TABLET | ORAL | 5 refills | Status: DC

## 2021-03-03 NOTE — Progress Notes (Signed)
Established patient visit   Patient: Ivan Kennedy   DOB: Aug 03, 1946   75 y.o. Male  MRN: [id number] Visit Date: 03/03/2021  Today's healthcare provider: Lavon Paganini, MD   Chief Complaint  Patient presents with   Hypertension   Subjective    Hypertension - Experienced dizziness while playing golf on 02/21/21; BP measured 82/60 at that time - Stopped HCTZ on 7/11, BP has since been stable at 120-140s/80s on average - Pt. complains of bil LE edema since stopping HCTZ  Diabetes - Home BG readings 120-135 - Denies polyuria, polydipsia; denies episodes of hypoglycemia  Hyperlipidemia - Stable, lipid panel WNL in 5/22     Medications: Outpatient Medications Prior to Visit  Medication Sig Note   aspirin EC 81 MG tablet Take 81 mg by mouth daily.     Blood Glucose Monitoring Suppl (ONE TOUCH ULTRA 2) w/Device KIT To check blood sugar once daily    doxycycline (VIBRAMYCIN) 50 MG capsule Take 50 mg by mouth 2 (two) times daily.    glimepiride (AMARYL) 4 MG tablet Take 1 tablet (4 mg total) by mouth in the morning and at bedtime.    Lancets (ACCU-CHEK SOFT TOUCH) lancets To check blood glucose daily    loratadine (CLARITIN) 10 MG tablet Take 10 mg by mouth daily.    losartan (COZAAR) 100 MG tablet Take 1 tablet (100 mg total) by mouth daily.    metoprolol succinate (TOPROL-XL) 25 MG 24 hr tablet Take 1 tablet (25 mg total) by mouth daily.    metroNIDAZOLE (METROGEL) 1 % gel Apply 1 application topically every morning.    omeprazole (PRILOSEC) 40 MG capsule TAKE 1 CAPSULE BY MOUTH EVERY DAY    ONETOUCH ULTRA test strip CHECK BLOOD SUGAR ONCE DAILY    pioglitazone (ACTOS) 45 MG tablet Take 1 tablet (45 mg total) by mouth daily.    simvastatin (ZOCOR) 20 MG tablet TAKE 1 TABLET BY MOUTH EVERY DAY IN THE EVENING    tretinoin (RETIN-A) 0.05 % cream Apply 1 application topically at bedtime.    vitamin B-12 (CYANOCOBALAMIN) 1000 MCG tablet Take 1,000 mcg by mouth daily.     [DISCONTINUED] RYBELSUS 14 MG TABS TAKE 14 MG (1 TABLET) BY MOUTH DAILY BEFORE BREAKFAST. TAKE ON AN EMPTY STOMACH WITH A SIP OF WATER    meclizine (ANTIVERT) 25 MG tablet Take 1 tablet (25 mg total) by mouth 3 (three) times daily as needed for dizziness. (Patient not taking: Reported on 03/03/2021)    [DISCONTINUED] hydrochlorothiazide (HYDRODIURIL) 25 MG tablet TAKE 1 TABLET BY MOUTH EVERY DAY (Patient not taking: Reported on 03/03/2021) 03/03/2021: Hasnt taken since 7/11 due to being advised after significant BP drop    No facility-administered medications prior to visit.    Review of Systems  Constitutional:  Negative for diaphoresis, fatigue and fever.  HENT: Negative.    Eyes: Negative.  Negative for visual disturbance.  Respiratory: Negative.  Negative for shortness of breath.   Cardiovascular:  Positive for leg swelling. Negative for chest pain.  Gastrointestinal: Negative.   Endocrine: Negative.  Negative for polydipsia, polyphagia and polyuria.  Musculoskeletal: Negative.   Skin: Negative.   Neurological: Negative.       Objective    BP 132/80   Pulse 73   Temp (!) 97.5 F (36.4 C) (Oral)   Ht _0  (1.854 m)   Wt 243 lb 4.8 oz (110.4 kg)   BMI 32.10 kg/m     Physical Exam Constitutional:  General: He is not in acute distress.    Appearance: Normal appearance. He is obese.  HENT:     Head: Normocephalic and atraumatic.     Right Ear: External ear normal.     Left Ear: External ear normal.  Cardiovascular:     Rate and Rhythm: Normal rate and regular rhythm.     Pulses: Normal pulses.     Heart sounds: Normal heart sounds.  Pulmonary:     Effort: Pulmonary effort is normal.     Breath sounds: Normal breath sounds.  Musculoskeletal:     Right lower leg: Edema present.     Left lower leg: Edema present.  Skin:    General: Skin is warm and dry.  Neurological:     Mental Status: He is alert.  Psychiatric:        Behavior: Behavior normal.        Thought  Content: Thought content normal.     No results found for any visits on 03/03/21.  Assessment & Plan     Problem List Items Addressed This Visit       Cardiovascular and Mediastinum   Hypertension associated with diabetes ([place]) - Primary    BP stable, discontinue HCTZ; continue losartan & metoprolol       Relevant Medications   Semaglutide (RYBELSUS) 14 MG TABS     Endocrine   Diabetes mellitus, type 2 (HCC)    Stable, due for A1c at next visit; continue medications as prescribed       Relevant Medications   Semaglutide (RYBELSUS) 14 MG TABS   Hyperlipidemia associated with type 2 diabetes mellitus (HCC)    Stable, continue simvastatin       Relevant Medications   Semaglutide (RYBELSUS) 14 MG TABS     Other   Obesity   Relevant Medications   Semaglutide (RYBELSUS) 14 MG TABS     Return in about 4 weeks (around 03/31/2021) for chronic disease f/u, as scheduled.       Percell Locus, MS3  Patient seen along with MS3 student Percell Locus. I personally evaluated this patient along with the student, and verified all aspects of the history, physical exam, and medical decision making as documented by the student. I agree with the student's documentation and have made all necessary edits.  Tannis Burstein, Dionne Bucy, MD, MPH [facility]

## 2021-03-03 NOTE — Assessment & Plan Note (Addendum)
BP stable, discontinue HCTZ; continue losartan & metoprolol

## 2021-03-03 NOTE — Assessment & Plan Note (Signed)
Stable, due for A1c at next visit; continue medications as prescribed

## 2021-03-03 NOTE — Assessment & Plan Note (Signed)
Stable, continue simvastatin

## 2021-03-04 DIAGNOSIS — G4733 Obstructive sleep apnea (adult) (pediatric): Secondary | ICD-10-CM | POA: Diagnosis not present

## 2021-03-09 ENCOUNTER — Telehealth: Payer: Self-pay | Admitting: Family Medicine

## 2021-03-09 NOTE — Telephone Encounter (Signed)
Can see if he can get it from another pharmacy or if we have samples

## 2021-03-09 NOTE — Telephone Encounter (Signed)
Medication Refill - Medication: Semaglutide (RYBELSUS) 14 MG TABS   Pt called to report that [facility] is on [facility] order for this medication. Please advise   [facility] [id number], [place] [address]  [place] [address]  Phone: [phone] Fax: [phone]

## 2021-03-10 NOTE — Telephone Encounter (Signed)
We do not have samples in office, will reach out to patient to see if he has contacted another pharmacy. KW

## 2021-03-10 NOTE — Telephone Encounter (Signed)
Patient has called to share that the following pharmacy has their medication in [place], [address] AT [address]  [place] [address]  Phone: [phone] Fax: [phone]

## 2021-03-10 NOTE — Telephone Encounter (Signed)
Lmtcb if patient calls back please inquire if he has contacted other local pharmacy to see if available? KW

## 2021-03-11 ENCOUNTER — Other Ambulatory Visit: Payer: Self-pay

## 2021-03-11 DIAGNOSIS — E1165 Type 2 diabetes mellitus with hyperglycemia: Secondary | ICD-10-CM

## 2021-03-11 MED ORDER — RYBELSUS 14 MG PO TABS: ORAL_TABLET | ORAL | 5 refills | Status: DC

## 2021-03-24 ENCOUNTER — Ambulatory Visit (INDEPENDENT_AMBULATORY_CARE_PROVIDER_SITE_OTHER): Payer: PPO | Admitting: Family Medicine

## 2021-03-24 ENCOUNTER — Other Ambulatory Visit: Payer: Self-pay

## 2021-03-24 ENCOUNTER — Encounter: Payer: Self-pay | Admitting: Family Medicine

## 2021-03-24 VITALS — BP 148/84 | Temp 97.6°F | Resp 16 | Wt 240.7 lb

## 2021-03-24 DIAGNOSIS — E669 Obesity, unspecified: Secondary | ICD-10-CM

## 2021-03-24 DIAGNOSIS — Z6832 Body mass index (BMI) 32.0-32.9, adult: Secondary | ICD-10-CM | POA: Diagnosis not present

## 2021-03-24 DIAGNOSIS — E1165 Type 2 diabetes mellitus with hyperglycemia: Secondary | ICD-10-CM | POA: Diagnosis not present

## 2021-03-24 DIAGNOSIS — I152 Hypertension secondary to endocrine disorders: Secondary | ICD-10-CM

## 2021-03-24 DIAGNOSIS — E1159 Type 2 diabetes mellitus with other circulatory complications: Secondary | ICD-10-CM | POA: Diagnosis not present

## 2021-03-24 DIAGNOSIS — E1169 Type 2 diabetes mellitus with other specified complication: Secondary | ICD-10-CM | POA: Diagnosis not present

## 2021-03-24 DIAGNOSIS — E785 Hyperlipidemia, unspecified: Secondary | ICD-10-CM

## 2021-03-24 LAB — POCT GLYCOSYLATED HEMOGLOBIN (HGB A1C)
Est. average glucose Bld gHb Est-mCnc: 123
Hemoglobin A1C: 5.9 % — AB (ref 4.0–5.6)

## 2021-03-24 MED ORDER — GLIMEPIRIDE 4 MG PO TABS: 4.0000 mg | ORAL_TABLET | Freq: Every day | ORAL | 1 refills | Status: DC

## 2021-03-24 NOTE — Progress Notes (Signed)
Established patient visit   Patient: Ivan Kennedy   DOB: [birth year]   75 y.o. Male  MRN: [id number] Visit Date: 03/24/2021  Today's healthcare provider: Lavon Paganini, MD   Chief Complaint  Patient presents with   Diabetes   Hypertension   Hyperlipidemia   Subjective    HPI  Type 2 Diabetes - Currently on Glimepiride, Actos, & Semaglutide - Pt. has been taking Glimepiride BID - Pt. states that BG at home typically ranges 90-120s; he reports one episode of 68  Hypertension - Pt. still endorses occasional dizziness (~2x/week) & LE edema since stopping HCTZ - Currently on Losartan & Metoprolol  Hyperlipidemia - Currently on simvastatin; denies myalgias   Health Maintenance - Due for Shingles & Tdap vaccines    Medications: Outpatient Medications Prior to Visit  Medication Sig   aspirin EC 81 MG tablet Take 81 mg by mouth daily.    Blood Glucose Monitoring Suppl (ONE TOUCH ULTRA 2) w/Device KIT To check blood sugar once daily   doxycycline (VIBRAMYCIN) 50 MG capsule Take 50 mg by mouth 2 (two) times daily.   Lancets (ACCU-CHEK SOFT TOUCH) lancets To check blood glucose daily   loratadine (CLARITIN) 10 MG tablet Take 10 mg by mouth daily.   losartan (COZAAR) 100 MG tablet Take 1 tablet (100 mg total) by mouth daily.   metoprolol succinate (TOPROL-XL) 25 MG 24 hr tablet Take 1 tablet (25 mg total) by mouth daily.   metroNIDAZOLE (METROGEL) 1 % gel Apply 1 application topically every morning.   omeprazole (PRILOSEC) 40 MG capsule TAKE 1 CAPSULE BY MOUTH EVERY DAY   ONETOUCH ULTRA test strip CHECK BLOOD SUGAR ONCE DAILY   pioglitazone (ACTOS) 45 MG tablet Take 1 tablet (45 mg total) by mouth daily.   Semaglutide (RYBELSUS) 14 MG TABS TAKE 14 MG (1 TABLET) BY MOUTH DAILY BEFORE BREAKFAST. TAKE ON AN EMPTY STOMACH WITH A SIP OF WATER   simvastatin (ZOCOR) 20 MG tablet TAKE 1 TABLET BY MOUTH EVERY DAY IN THE EVENING   tretinoin (RETIN-A) 0.05 % cream Apply 1  application topically at bedtime.   vitamin B-12 (CYANOCOBALAMIN) 1000 MCG tablet Take 1,000 mcg by mouth daily.   [DISCONTINUED] glimepiride (AMARYL) 4 MG tablet TAKE 1 TABLET (4 MG TOTAL) BY MOUTH DAILY BEFORE BREAKFAST.   [DISCONTINUED] meclizine (ANTIVERT) 25 MG tablet Take 1 tablet (25 mg total) by mouth 3 (three) times daily as needed for dizziness. (Patient not taking: No sig reported)   No facility-administered medications prior to visit.    Review of Systems  Constitutional:  Negative for activity change, appetite change, diaphoresis, fatigue, fever and unexpected weight change.  HENT: Negative.    Eyes: Negative.  Negative for visual disturbance.  Respiratory:  Negative for chest tightness and shortness of breath.   Cardiovascular:  Positive for leg swelling. Negative for chest pain.  Gastrointestinal: Negative.   Endocrine: Negative for polydipsia, polyphagia and polyuria.  Genitourinary: Negative.   Musculoskeletal: Negative.   Skin: Negative.   Neurological:  Positive for dizziness.  Psychiatric/Behavioral: Negative.        Objective    BP (!) 148/84 (BP Location: Left Arm, Patient Position: Sitting, Cuff Size: Large)   Temp 97.6 F (36.4 C) (Oral)   Resp 16   Wt 240 lb 11.2 oz (109.2 kg)   BMI 31.76 kg/m     Physical Exam Constitutional:      General: He is not in acute distress.    Appearance:  Normal appearance. He is obese.  HENT:     Head: Normocephalic and atraumatic.     Right Ear: External ear normal.     Left Ear: External ear normal.  Eyes:     Conjunctiva/sclera: Conjunctivae normal.  Cardiovascular:     Rate and Rhythm: Normal rate and regular rhythm.     Pulses: Normal pulses.     Heart sounds: Normal heart sounds.  Pulmonary:     Effort: Pulmonary effort is normal.     Breath sounds: Normal breath sounds.  Abdominal:     General: Abdomen is flat. Bowel sounds are normal.     Palpations: Abdomen is soft.  Musculoskeletal:     Right lower  leg: Edema present.     Left lower leg: Edema present.  Skin:    General: Skin is warm and dry.  Neurological:     General: No focal deficit present.     Mental Status: He is alert.  Psychiatric:        Behavior: Behavior normal.        Thought Content: Thought content normal.      Results for orders placed or performed in visit on 03/24/21  POCT glycosylated hemoglobin (Hb A1C)  Result Value Ref Range   Hemoglobin A1C 5.9 (A) 4.0 - 5.6 %   Est. average glucose Bld gHb Est-mCnc 123     Assessment & Plan     Problem List Items Addressed This Visit       Cardiovascular and Mediastinum   Hypertension associated with diabetes ([place])    - Chronic, well-controlled - Continue Losartan & Metoprolol - Counseled pt. to elevate bil LEs when sedentary & wear compression socks prn for edema      Relevant Medications   glimepiride (AMARYL) 4 MG tablet     Endocrine   Diabetes mellitus, type 2 (HCC) - Primary    - Chronic, well-controlled - Large improvement in A1c (down to 5.9 today) with occasional episodes of dizziness - Instructed pt. to continue checking BG at home to monitor for hypoglycemic episodes - Counseled pt. to take Glimepiride 1x per day as prescribed  - Will continue to monitor       Relevant Medications   glimepiride (AMARYL) 4 MG tablet   Other Relevant Orders   POCT glycosylated hemoglobin (Hb A1C) (Completed)   Hyperlipidemia associated with type 2 diabetes mellitus (HCC)    - Chronic and stable - Continue simvastatin      Relevant Medications   glimepiride (AMARYL) 4 MG tablet     Other   Obesity    - Chronic, stable - Counseled pt. on the importance of healthy diet & regular exercise      Relevant Medications   glimepiride (AMARYL) 4 MG tablet     Return in about 3 months (around 06/24/2021) for chronic disease f/u.        Percell Locus, MS3   Patient seen along with MS3 student Percell Locus. I personally evaluated this patient along  with the student, and verified all aspects of the history, physical exam, and medical decision making as documented by the student. I agree with the student's documentation and have made all necessary edits.  Oluwatimileyin Vivier, Dionne Bucy, MD, MPH [facility]

## 2021-03-24 NOTE — Assessment & Plan Note (Signed)
-   Chronic, well-controlled - Continue Losartan & Metoprolol - Counseled pt. to elevate bil LEs when sedentary & wear compression socks prn for edema

## 2021-03-24 NOTE — Assessment & Plan Note (Signed)
-   Chronic, stable - Counseled pt. on the importance of healthy diet & regular exercise

## 2021-03-24 NOTE — Assessment & Plan Note (Signed)
-   Chronic and stable - Continue simvastatin

## 2021-03-24 NOTE — Assessment & Plan Note (Signed)
-   Chronic, well-controlled - Large improvement in A1c (down to 5.9 today) with occasional episodes of dizziness - Instructed pt. to continue checking BG at home to monitor for hypoglycemic episodes - Counseled pt. to take Glimepiride 1x per day as prescribed  - Will continue to monitor

## 2021-03-31 DIAGNOSIS — H2513 Age-related nuclear cataract, bilateral: Secondary | ICD-10-CM | POA: Diagnosis not present

## 2021-04-04 DIAGNOSIS — G4733 Obstructive sleep apnea (adult) (pediatric): Secondary | ICD-10-CM | POA: Diagnosis not present

## 2021-04-11 ENCOUNTER — Telehealth: Payer: Self-pay | Admitting: *Deleted

## 2021-04-11 NOTE — Chronic Care Management (AMB) (Signed)
  Care Management   Note  04/11/2021 Name: Ivan Kennedy MRN: [id number] DOB: 03/14/1946  Ivan Kennedy is a 75 y.o. year old male who is a primary care patient of Brita Romp, Dionne Bucy, MD and is actively engaged with the care management team. I reached out to Soyla Dryer by phone today to assist with re-scheduling a follow up visit with the Pharmacist  Follow up plan: A telephone outreach attempt made patient is out of town and is requesting a return call after 04/19/21.  The care management team will reach out to the patient again over the next 7 days. If patient returns call to provider office, please advise to call [place] at [phone].  [place] [facility]  Direct Dial: [phone]

## 2021-04-20 ENCOUNTER — Other Ambulatory Visit: Payer: Self-pay | Admitting: Physician Assistant

## 2021-04-20 DIAGNOSIS — I10 Essential (primary) hypertension: Secondary | ICD-10-CM

## 2021-04-20 NOTE — Chronic Care Management (AMB) (Signed)
  Care Management   Note  04/20/2021 Name: TYRIS SALSEDO MRN: [id number] DOB: April 20, 1946  JABRELL RACZKOWSKI is a 75 y.o. year old male who is a primary care patient of Brita Romp, Dionne Bucy, MD and is actively engaged with the care management team. I reached out to Soyla Dryer by phone today to assist with re-scheduling a follow up visit with the Pharmacist  Follow up plan: Unsuccessful telephone outreach attempt made. A HIPAA compliant phone message was left for the patient providing contact information and requesting a return call.  The care management team will reach out to the patient again over the next 7 days.  If patient returns call to provider office, please advise to call [place] at [phone].  [place] Management  Direct Dial: [phone]

## 2021-04-21 DIAGNOSIS — H2511 Age-related nuclear cataract, right eye: Secondary | ICD-10-CM | POA: Diagnosis not present

## 2021-04-22 NOTE — Chronic Care Management (AMB) (Signed)
  Care Management   Note  04/22/2021 Name: Ivan Kennedy MRN: [id number] DOB: 09-Jul-1946  Ivan Kennedy is a 75 y.o. year old male who is a primary care patient of Brita Romp, Dionne Bucy, MD and is actively engaged with the care management team. I reached out to Soyla Dryer by phone today to assist with re-scheduling a follow up visit with the Pharmacist  Follow up plan: Telephone appointment with care management team member scheduled for:04/26/21  [place] Management  Direct Dial: [phone]

## 2021-04-22 NOTE — Chronic Care Management (AMB) (Signed)
  Care Management   Note  04/22/2021 Name: RAHIL REH MRN: [id number] DOB: [birth year]  Ivan Kennedy is a 75 y.o. year old male who is a primary care patient of Brita Romp, Dionne Bucy, MD and is actively engaged with the care management team. I reached out to Soyla Dryer by phone today to assist with re-scheduling a follow up visit with the Pharmacist  Follow up plan: Unsuccessful telephone outreach attempt made. A HIPAA compliant phone message was left for the patient providing contact information and requesting a return call.  The care management team will reach out to the patient again over the next 7 days.  If patient returns call to provider office, please advise to call [place] at [place] [facility]  Direct Dial: [phone]

## 2021-04-23 ENCOUNTER — Other Ambulatory Visit: Payer: Self-pay | Admitting: Family Medicine

## 2021-04-23 DIAGNOSIS — I10 Essential (primary) hypertension: Secondary | ICD-10-CM

## 2021-04-23 NOTE — Telephone Encounter (Signed)
Requested medications are due for refill today.  Unknown  Requested medications are on the active medications list.  no  Last refill. 01/26/2021  Future visit scheduled.   yes  Notes to clinic.  Medication was discontinued 03/03/2021

## 2021-04-25 ENCOUNTER — Telehealth: Payer: Self-pay

## 2021-04-26 ENCOUNTER — Ambulatory Visit (INDEPENDENT_AMBULATORY_CARE_PROVIDER_SITE_OTHER): Payer: PPO

## 2021-04-26 DIAGNOSIS — E1165 Type 2 diabetes mellitus with hyperglycemia: Secondary | ICD-10-CM

## 2021-04-26 DIAGNOSIS — E1159 Type 2 diabetes mellitus with other circulatory complications: Secondary | ICD-10-CM

## 2021-04-26 NOTE — Progress Notes (Signed)
Chronic Care Management Pharmacy Note  04/26/2021 Name:  Ivan Kennedy MRN:  [id number] DOB:  1945-10-03  Summary: Patient presents for CCM follow-up. Blood sugars improved with decreased dose of glimepiride. Blood pressures have been elevated since stopping HCTZ, but patient did not have home readings with him at this time.   Recommendations/Changes made from today's visit: Continue current medications Increase blood pressure monitoring to twice weekly for one month   Plan: HC to assess BP readings in one month  CPP follow-up in 3 months  Subjective: Ivan Kennedy is an 75 y.o. year old male who is a primary patient of Bacigalupo, Dionne Bucy, MD.  The CCM team was consulted for assistance with disease management and care coordination needs.    Engaged with patient by telephone for follow up visit in response to provider referral for pharmacy case management and/or care coordination services.   Consent to Services:  The patient was given information about Chronic Care Management services, agreed to services, and gave verbal consent prior to initiation of services.  Please see initial visit note for detailed documentation.   Patient Care Team: Virginia Crews, MD as PCP - General (Family Medicine) Arelia Sneddon, [place] as Consulting Physician (Optometry) Jannet Mantis, MD as Consulting Physician (Dermatology) Ivan Bellows, MD as Consulting Physician (Gastroenterology) Germaine Pomfret, [facility] (Pharmacist)  Recent office visits: 03/24/21: Patient presented to Dr. Brita Romp for follow-up. A1c decreased to 5.9%. Glimepiride decreased from BID to daily.   03/03/21: Patient presented to Dr. Brita Romp for follow-up. HCTZ stopped. 12/30/2020 Dr.Bacigalupo MD (PCP)-Stop Metformin XR 500 mg 12/23/2020 Dr.Bacigalupo MD (PCP)- Stop Januvia 100 mg, start Metformin XR 500 mg 1 tablet  daily with breakfast, start Meclizine 25 mg PRN 09/24/2020 [name] PA-C (PCP)-  Start  Rybelsus Take 7 mg by mouth daily,Completed Course of Potassium Chloride. 07/20/2020 [name] PA-C (PCP)- Updated directions sent in for Glimepiride for  4 mg 1 tablet twice a day. 07/16/2020 [name] PA-C (PCP) Referral placed for Dr.Howard Sabra [facility] at [facility].  Recent consult visits: 11/04/2020 Dr. Aubery Lapping MD (Dermatology) -No medication Change Noted 08/16/2020 Rivka Barbara Physical Therapist (Physical Therapy)- No medication change noted 08/16/2020 Darcey Nora Physical Therapist (Physical Therapy)- No medication change noted. 07/27/2020 Philis Nettle MD (Dermatology) - No medication Change Noted 07/26/2020 Carlynn Spry PA [facility]) -No medication change noted. 07/26/2020 Dr. Sabra Heck MD  (Ortopedic Surgery) - No medication changes noted.  Hospital visits: None in previous 6 months  Objective:  Lab Results  Component Value Date   CREATININE 1.19 12/23/2020   BUN 26 12/23/2020   GFRNONAA 73 11/06/2019   GFRAA 84 11/06/2019   NA 141 12/23/2020   K 3.6 12/23/2020   CALCIUM 9.9 12/23/2020   CO2 25 12/23/2020   GLUCOSE 172 (H) 12/23/2020    Lab Results  Component Value Date/Time   HGBA1C 5.9 (A) 03/24/2021 08:22 AM   HGBA1C 8.0 (H) 12/23/2020 09:32 AM   HGBA1C 8.3 (A) 09/24/2020 08:24 AM   HGBA1C 7.2 (H) 11/06/2019 09:32 AM   MICROALBUR 20 04/24/2018 08:29 AM   MICROALBUR 50 11/02/2015 11:23 AM    Last diabetic Eye exam:  Lab Results  Component Value Date/Time   HMDIABEYEEXA No Retinopathy 02/24/2021 12:00 AM    Last diabetic Foot exam: No results found for: HMDIABFOOTEX   Lab Results  Component Value Date   CHOL 132 12/23/2020   HDL 45 12/23/2020   LDLCALC 65 12/23/2020   TRIG 126 12/23/2020   CHOLHDL 2.9  12/23/2020    Hepatic Function Latest Ref Rng & Units 12/23/2020 11/06/2019 10/30/2018  Total Protein 6.0 - 8.5 g/dL 6.4 6.5 5.9(L)  Albumin 3.7 - 4.7 g/dL 4.4 4.4 4.3  AST 0 - 40 IU/L 23 29 26   ALT 0 - 44  IU/L 25 38 33  Alk Phosphatase 44 - 121 IU/L 53 58 57  Total Bilirubin 0.0 - 1.2 mg/dL 0.7 0.9 0.9    Lab Results  Component Value Date/Time   TSH 1.650 10/30/2018 10:17 AM   TSH 1.460 10/30/2017 08:56 AM    CBC Latest Ref Rng & Units 11/06/2019 10/30/2018 10/30/2017  WBC 3.4 - 10.8 x10E3/uL 6.5 6.1 5.4  Hemoglobin 13.0 - 17.7 g/dL 13.5 13.8 14.2  Hematocrit 37.5 - 51.0 % 38.2 39.7 41.6  Platelets 150 - 450 x10E3/uL 172 150 145(L)    No results found for: VD25OH  Clinical ASCVD: No  The 10-year ASCVD risk score (Arnett DK, et al., 2019) is: 47.1%   Values used to calculate the score:     Age: 77 years     Sex: Male     Is Non-Hispanic African American: No     Diabetic: Yes     Tobacco smoker: No     Systolic Blood Pressure: 037 mmHg     Is BP treated: Yes     HDL Cholesterol: 45 mg/dL     Total Cholesterol: 132 mg/dL    Depression screen [facility] 2/9 03/24/2021 03/03/2021 12/23/2020  Decreased Interest 0 0 0  Down, Depressed, Hopeless 0 0 0  PHQ - 2 Score 0 0 0  Altered sleeping 0 0 0  Tired, decreased energy 1 1 1   Change in appetite 0 0 0  Feeling bad or failure about yourself  0 0 0  Trouble concentrating 0 0 0  Moving slowly or fidgety/restless 0 0 0  Suicidal thoughts 0 0 0  PHQ-9 Score 1 1 1   Difficult doing work/chores Not difficult at all Not difficult at all Not difficult at all    Social History   Tobacco Use  Smoking Status Never  Smokeless Tobacco Never   BP Readings from Last 3 Encounters:  03/24/21 (!) 148/84  03/03/21 132/80  12/23/20 135/75   Pulse Readings from Last 3 Encounters:  03/03/21 73  12/23/20 62  09/24/20 68   Wt Readings from Last 3 Encounters:  03/24/21 240 lb 11.2 oz (109.2 kg)  03/03/21 243 lb 4.8 oz (110.4 kg)  12/23/20 242 lb (109.8 kg)   BMI Readings from Last 3 Encounters:  03/24/21 31.76 kg/m  03/03/21 32.10 kg/m  12/23/20 32.37 kg/m    Assessment/Interventions: Review of patient past medical history, allergies,  medications, health status, including review of consultants reports, laboratory and other test data, was performed as part of comprehensive evaluation and provision of chronic care management services.   SDOH:  (Social Determinants of Health) assessments and interventions performed: Yes   SDOH Screenings   Alcohol Screen: Low Risk    Last Alcohol Screening Score (AUDIT): 2  Depression (PHQ2-9): Low Risk    PHQ-2 Score: 1  Financial Resource Strain: Low Risk    Difficulty of Paying Living Expenses: Not hard at all  Food Insecurity: Not on file  Housing: Not on file  Physical Activity: Not on file  Social Connections: Not on file  Stress: Not on file  Tobacco Use: Low Risk    Smoking Tobacco Use: Never   Smokeless Tobacco Use: Never  Transportation Needs: Not  on file    CCM Care Plan  No Known Allergies  Medications Reviewed Today     Reviewed by Virginia Crews, MD (Physician) on 03/24/21 at [phone]  Med List Status: <None>   Medication Order Taking? Sig Documenting Provider Last Dose Status Informant  aspirin EC 81 MG tablet [id number] Yes Take 81 mg by mouth daily.  [provider] Taking Active            Med Note Wilson Singer   Tue Nov 09, 2015 10:46 AM) Received from: [place]  Blood Glucose Monitoring Suppl (ONE TOUCH ULTRA 2) w/Device Drucie Opitz [id number] Yes To check blood sugar once daily Mar Daring, [place] Taking Active   doxycycline (VIBRAMYCIN) 50 MG capsule [id number] Yes Take 50 mg by mouth 2 (two) times daily. [provider] Taking Active            Med Note Michaelle Birks, Cathe Mons A   Tue Jan 04, 2021  3:18 PM) Prescribed by Dr. Aubery Lapping  glimepiride (AMARYL) 4 MG tablet [id number] Yes TAKE 1 TABLET (4 MG TOTAL) BY MOUTH DAILY BEFORE BREAKFAST. Virginia Crews, MD Taking Active   Lancets [facility] SOFT [facility]) lancets [id number] Yes To check blood glucose daily Mar Daring, [place] Taking Active    loratadine (CLARITIN) 10 MG tablet [id number] Yes Take 10 mg by mouth daily. [provider] Taking Active   losartan (COZAAR) 100 MG tablet [id number] Yes Take 1 tablet (100 mg total) by mouth daily. Mar Daring, [place] Taking Active   Patient not taking:  Discontinued 03/24/21 0837   metoprolol succinate (TOPROL-XL) 25 MG 24 hr tablet [id number] Yes Take 1 tablet (25 mg total) by mouth daily. [name] M, PA-C Taking Active   metroNIDAZOLE (METROGEL) 1 % gel [id number] Yes Apply 1 application topically every morning. [provider] Taking Active            Med Note Wilmon Arms Apr 21, 2015  2:29 PM) Received from: External Pharmacy  omeprazole (PRILOSEC) 40 MG capsule [id number] Yes TAKE 1 CAPSULE BY MOUTH EVERY DAY Mar Daring, PA-C Taking Active   [facility] ULTRA test strip [id number] Yes CHECK BLOOD SUGAR ONCE DAILY Mar Daring, PA-C Taking Active   pioglitazone (ACTOS) 45 MG tablet [id number] Yes Take 1 tablet (45 mg total) by mouth daily. [name] M, PA-C Taking Active   Semaglutide (RYBELSUS) 14 MG TABS [id number] Yes TAKE 14 MG (1 TABLET) BY MOUTH DAILY BEFORE BREAKFAST. TAKE ON AN EMPTY STOMACH WITH A SIP OF WATER Virginia Crews, MD Taking Active   simvastatin (ZOCOR) 20 MG tablet [id number] Yes TAKE 1 TABLET BY MOUTH EVERY DAY IN THE EVENING Bacigalupo, Dionne Bucy, MD Taking Active   tretinoin (RETIN-A) 0.05 % cream [id number] Yes Apply 1 application topically at bedtime. Mar Daring, PA-C Taking Active   vitamin B-12 (CYANOCOBALAMIN) 1000 MCG tablet [id number] Yes Take 1,000 mcg by mouth daily. [provider] Taking Active             Patient Active Problem List   Diagnosis Date Noted   Obesity 03/03/2021   Benign paroxysmal positional vertigo 12/23/2020   Hallux valgus 07/31/2018   IBS (irritable bowel syndrome) 01/21/2015   B12 deficiency 01/07/2015   Hyperlipidemia associated with type 2  diabetes mellitus ([place]) 01/07/2015   Cannot sleep 01/07/2015   Fatty liver disease, nonalcoholic 22/63/3354   Osteoarthritis of knee 01/07/2015   Acne erythematosa 01/07/2015  Disorder of male genital organs 01/07/2015   Atypical chest pain 10/09/2014   Acid reflux 09/06/2009   Diabetes mellitus, type 2 ([place]) 11/20/2008   Colon, diverticulosis 11/20/2008   Hypertension associated with diabetes ([place]) 11/20/2008   Apnea, sleep 11/20/2008   History of artificial joint 11/20/2008    Immunization History  Administered Date(s) Administered   Fluad Quad(high Dose 65+) 05/08/2019, 06/23/2020   Hepatitis B, adult 05/29/2012, 07/29/2012, 03/18/2013   Influenza, High Dose Seasonal PF 07/06/2014, 05/06/2015, 07/11/2016, 04/26/2017, 04/24/2018   PFIZER(Purple Top)SARS-COV-2 Vaccination 09/29/2019, 10/28/2019   Pneumococcal Conjugate-13 07/06/2014   Pneumococcal Polysaccharide-23 05/29/2012, 10/30/2018    Conditions to be addressed/monitored:  Hypertension, Hyperlipidemia, Diabetes, GERD, Osteoarthritis and IBS  There are no care plans that you recently modified to display for this patient.    Medication Assistance: None required.  Patient affirms current coverage meets needs.  [facility]  [id number] [id number]: [phone]: [phone] [place] [address]Phone: [phone]: [phone] [id number] [place], [place]one: [phone]: [phone] pill box? Yes Pt endorses 100% compliance  We discussed: Current pharmacy is preferred with insurance plan and patient is satisfied with pharmacy services Patient decided to: Continue current medication management strategy  Care Plan and Follow Up Patient  Decision:  Patient agrees to Care Plan and Follow-up.  Plan: Telephone follow up appointment with care management team member scheduled for:  07/26/2021 at 8:30 AM  AlJunius ArgylePharmD, [phone]

## 2021-04-26 NOTE — Patient Instructions (Signed)
Visit Information It was great speaking with you today!  Please let me know if you have any questions about our visit.   Goals Addressed             This Visit's Progress    Monitor and Manage My Blood Sugar-Diabetes Type 2   On track    Timeframe:  Long-Range Goal Priority:  High Start Date:  01/04/2021                            Expected End Date: 07/07/2022                      Follow Up within 90 days   - check blood sugar at prescribed times - check blood sugar if I feel it is too high or too low - enter blood sugar readings and medication or insulin into daily log    Why is this important?   Checking your blood sugar at home helps to keep it from getting very high or very low.  Writing the results in a diary or log helps the doctor know how to care for you.  Your blood sugar log should have the time, date and the results.  Also, write down the amount of insulin or other medicine that you take.  Other information, like what you ate, exercise done and how you were feeling, will also be helpful.     Notes:         Patient Care Plan: General Pharmacy (Adult)     Problem Identified: Hypertension, Hyperlipidemia, Diabetes, GERD, Osteoarthritis and IBS   Priority: High     Long-Range Goal: Patient-Specific Goal   Start Date: 01/04/2021  Expected End Date: 04/26/2022  This Visit's Progress: On track  Recent Progress: On track  Priority: High  Note:   Current Barriers:  Unable to achieve control of diabetes   Pharmacist Clinical Goal(s):  Patient will achieve control of diabetes as evidenced by A1c less than 8% through collaboration with PharmD and provider.   Interventions: 1:1 collaboration with Virginia Crews, MD regarding development and update of comprehensive plan of care as evidenced by provider attestation and co-signature Inter-disciplinary care team collaboration (see longitudinal plan of care) Comprehensive medication review performed; medication  list updated in electronic medical record  Hypertension (BP goal <140/90) -Not ideally Controlled -Current treatment: Losartan 100 mg daily  Metoprolol XL 25 mg daily (history of skipped beats)  -Medications previously tried: HCTZ (Hypotension)   -Current home readings: Did not have BP readings with him  -Denies hypotension symptoms. Improved with discontinuation of HCTZ.  -Educated on Symptoms of hypotension and importance of maintaining adequate hydration; -Counseled to monitor BP at home twice weekly, document, and provide log at future appointments -Recommended to continue current medication  Hyperlipidemia: (LDL goal < 70) -Controlled -Current treatment: Simvastatin 20 mg nightly  -Current antiplatelet treatment: Aspirin 81 mg daily  -Medications previously tried: NA  -Educated on Importance of limiting foods high in cholesterol; -Recommended to continue current medication  Diabetes (A1c goal <8%) -Controlled -Current medications: Glimepiride 4 mg daily   Pioglitazone 45 mg daily  Rybelsus 14 mg daily  -Medications previously tried: Metformin IR + ER (GI), Januvia (DDI with GLP-1), Invokana (dizziness), Trulicity, glipizide,    -Current home glucose readings  Fasting  13-Sep 118  12-Sep 107  11-Sep 125  10-Sep 128  9-Sep 117  8-Sep 138  7-Sep 124  6-Sep  114  5-Sep 119  4-Sep 119  3-Sep 123  2-Sep 122  1-Sep 102  31-Aug 137  Average 121  -Denies hypoglycemic/hyperglycemic symptoms -Current meal patterns: Avoids sweet drinks, desserts, breakfast: Ham biscuit + water lunch: Hot dog + Pack chips + water   dinner: Country style steak + coleslaw + rice + green beans + biscuit + water Snacks: strawberries  drinks: Primarily water. Zero Pepsi every other day,  -Current exercise: Active with farm work.  -Continue current medications  GERD (Goal: Prevent heartburn/reflux symptoms) -Controlled -Current treatment  Omeprazole 40 mg daily  -Medications previously  tried: NA  -Recommended to continue current medication  Allergic Rhinitis (Goal: Maintain symptom control) -Controlled -Current treatment   Claritin 1 tablet daily  -Medications previously tried: NA -Counseled on risks of increased blood pressure with pseudoephedrine.  -Recommended switching to claritin daily   Patient Goals/Self-Care Activities Patient will:  - check glucose daily, document, and provide at future appointments check blood pressure weekly, document, and provide at future appointments  Follow Up Plan: Telephone follow up appointment with care management team member scheduled for:  07/26/2021 at 8:30 AM      Patient agreed to services and verbal consent obtained.   Patient verbalizes understanding of instructions provided today and agrees to view in [place].   [name], PharmD, [place], [place] [phone]

## 2021-05-05 DIAGNOSIS — G4733 Obstructive sleep apnea (adult) (pediatric): Secondary | ICD-10-CM | POA: Diagnosis not present

## 2021-05-07 ENCOUNTER — Other Ambulatory Visit: Payer: Self-pay | Admitting: Physician Assistant

## 2021-05-07 DIAGNOSIS — I10 Essential (primary) hypertension: Secondary | ICD-10-CM

## 2021-05-11 ENCOUNTER — Encounter: Payer: Self-pay | Admitting: Ophthalmology

## 2021-05-13 DIAGNOSIS — I152 Hypertension secondary to endocrine disorders: Secondary | ICD-10-CM

## 2021-05-13 DIAGNOSIS — E1159 Type 2 diabetes mellitus with other circulatory complications: Secondary | ICD-10-CM | POA: Diagnosis not present

## 2021-05-13 DIAGNOSIS — E1165 Type 2 diabetes mellitus with hyperglycemia: Secondary | ICD-10-CM

## 2021-05-18 ENCOUNTER — Other Ambulatory Visit: Payer: Self-pay | Admitting: Physician Assistant

## 2021-05-18 ENCOUNTER — Telehealth: Payer: Self-pay

## 2021-05-18 DIAGNOSIS — K219 Gastro-esophageal reflux disease without esophagitis: Secondary | ICD-10-CM

## 2021-05-18 DIAGNOSIS — E1165 Type 2 diabetes mellitus with hyperglycemia: Secondary | ICD-10-CM

## 2021-05-18 NOTE — Progress Notes (Signed)
Chronic Care Management Pharmacy Assistant   Name: Ivan Kennedy  MRN: [id number] DOB: 1946-07-08  Reason for Encounter:Hypertension  Disease State Call.   Recent office visits:  No recent Office Visit  Recent consult visits:  No recent [facility] visits:  None in previous 6 months  Medications: Outpatient Encounter Medications as of 05/18/2021  Medication Sig Note   aspirin EC 81 MG tablet Take 81 mg by mouth daily.  11/09/2015: Received from: [place]   Blood Glucose Monitoring Suppl (ONE TOUCH ULTRA 2) w/Device KIT To check blood sugar once daily    doxycycline (VIBRAMYCIN) 50 MG capsule Take 50 mg by mouth 2 (two) times daily. 01/04/2021: Prescribed by Dr. Aubery Lapping   glimepiride (AMARYL) 4 MG tablet Take 1 tablet (4 mg total) by mouth daily with breakfast.    Lancets (ACCU-CHEK SOFT TOUCH) lancets To check blood glucose daily    loratadine (CLARITIN) 10 MG tablet Take 10 mg by mouth daily.    losartan (COZAAR) 100 MG tablet TAKE 1 TABLET BY MOUTH EVERY DAY    metoprolol succinate (TOPROL-XL) 25 MG 24 hr tablet TAKE 1 TABLET (25 MG TOTAL) BY MOUTH DAILY.    metroNIDAZOLE (METROGEL) 1 % gel Apply 1 application topically every morning. 04/21/2015: Received from: External Pharmacy   omeprazole (PRILOSEC) 40 MG capsule TAKE 1 CAPSULE BY MOUTH EVERY DAY    ONETOUCH ULTRA test strip CHECK BLOOD SUGAR ONCE DAILY    pioglitazone (ACTOS) 45 MG tablet Take 1 tablet (45 mg total) by mouth daily.    Semaglutide (RYBELSUS) 14 MG TABS TAKE 14 MG (1 TABLET) BY MOUTH DAILY BEFORE BREAKFAST. TAKE ON AN EMPTY STOMACH WITH A SIP OF WATER    simvastatin (ZOCOR) 20 MG tablet TAKE 1 TABLET BY MOUTH EVERY DAY IN THE EVENING    tretinoin (RETIN-A) 0.05 % cream Apply 1 application topically at bedtime.    vitamin B-12 (CYANOCOBALAMIN) 1000 MCG tablet Take 1,000 mcg by mouth daily.    No facility-administered encounter medications on file as of 05/18/2021.     Care Gaps: Tetanus Vaccine Shingrix Vaccine COVID-19 Vaccine (3-Booster for Pfizer series) Influenza Vaccine HTN- 148/84 on 03/24/2021  Star Rating Drugs: Losartan 100 mg last [facility]. Glimepiride 4 mg last filled 04/21/2021 [facility]. Simvastatin 20 mg last filled 03/20/2021 [facility]. Pioglitazone 45 mg last filled 02/13/2021 [facility]. Rybelsus 14 mg   last filled [facility]/02/2021 [facility]  Medication Fill Gaps: None  Reviewed chart prior to disease state call. Spoke with patient regarding BP  Recent Office Vitals: BP Readings from Last 3 Encounters:  03/24/21 (!) 148/84  03/03/21 132/80  12/23/20 135/75   Pulse Readings from Last 3 Encounters:  03/03/21 73  12/23/20 62  09/24/20 68    Wt Readings from Last 3 Encounters:  03/24/21 240 lb 11.2 oz (109.2 kg)  03/03/21 243 lb 4.8 oz (110.4 kg)  12/23/20 242 lb (109.8 kg)     Kidney Function Lab Results  Component Value Date/Time   CREATININE 1.19 12/23/2020 09:32 AM   CREATININE 1.03 11/06/2019 09:32 AM   CREATININE 1.00 04/26/2017 09:02 AM   GFRNONAA 73 11/06/2019 09:32 AM   GFRNONAA 76 04/26/2017 09:02 AM   GFRAA 84 11/06/2019 09:32 AM   GFRAA 89 04/26/2017 09:02 AM    BMP Latest Ref Rng & Units  12/23/2020 11/06/2019 10/30/2018  Glucose 65 - 99 mg/dL 172(H) 133(H) 146(H)  BUN 8 - 27 mg/dL 26 17 22   Creatinine 0.76 - 1.27 mg/dL 1.19 1.03 1.03  BUN/Creat Ratio 10 - 24 22 17 21   Sodium 134 - 144 mmol/L 141 144 142  Potassium 3.5 - 5.2 mmol/L 3.6 2.9(L) 3.4(L)  Chloride 96 - 106 mmol/L 101 100 101  CO2 20 - 29 mmol/L 25 25 24   Calcium 8.6 - 10.2 mg/dL 9.9 9.2 9.3    Current antihypertensive regimen:  Losartan 100 mg daily  Metoprolol XL 25 mg daily (history of skipped beats)   Patient denies headaches,dizziness, or lightheadedness.  How often are you checking your  Blood Pressure? daily Current home BP readings:  Patient states he is unable to fine his blood pressure log, and does not remember what his blood pressure was. What recent interventions/DTPs have been made by any provider to improve Blood Pressure control since last CPP Visit: None ID Any recent hospitalizations or ED visits since last visit with CPP? No What diet changes have been made to improve Blood Pressure Control?  Patient denies any changes in his diet at this time. What exercise is being done to improve your Blood Pressure Control?  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  Blood sugars readings (Fasting):  On 05/19/2021 it was 127. On 05/15/2021 it was 150. (Patient states he is not sure why his sugar reading was that elevated) On 05/14/2021 it was 96. On 05/13/2021 it was 130. On 05/12/2021 it was 106. On 05/11/2021 it was 118. On 05/10/2021 it was 130. On 05/09/2021 it was 126. On 05/07/2021 it was 108. On 05/06/2021 it was 142. On 05/05/2021 it was 134. On 05/04/2021 it was 132. On 05/03/2021 it was 97. On 05/02/2021 it was 143. On 04/28/2021 it was 142. On 04/27/2021 it was 145.  Adherence Review: Is the patient currently on ACE/ARB medication? Yes Does the patient have >5 day gap between last estimated fill dates? No  Patient is aware he can return my call at [phone] if he has any question or concerns.  [place] Pharmacist Assistant [phone]

## 2021-05-23 NOTE — Discharge Instructions (Signed)

## 2021-05-25 ENCOUNTER — Encounter: Payer: Self-pay | Admitting: Ophthalmology

## 2021-05-25 ENCOUNTER — Encounter
Admission: RE | Disposition: A | Discharge status: Home / Self Care | Payer: Self-pay | Source: Home / Self Care | Attending: Ophthalmology

## 2021-05-25 ENCOUNTER — Ambulatory Visit
Admission: RE | Admit: 2021-05-25 | Discharge: 2021-05-25 | Disposition: A | Discharge status: Home / Self Care | Payer: PPO | Attending: Ophthalmology | Admitting: Ophthalmology

## 2021-05-25 ENCOUNTER — Ambulatory Visit: Payer: PPO | Admitting: Anesthesiology

## 2021-05-25 ENCOUNTER — Other Ambulatory Visit: Payer: Self-pay

## 2021-05-25 DIAGNOSIS — Z7984 Long term (current) use of oral hypoglycemic drugs: Secondary | ICD-10-CM | POA: Insufficient documentation

## 2021-05-25 DIAGNOSIS — Z833 Family history of diabetes mellitus: Secondary | ICD-10-CM | POA: Diagnosis not present

## 2021-05-25 DIAGNOSIS — Z79899 Other long term (current) drug therapy: Secondary | ICD-10-CM | POA: Insufficient documentation

## 2021-05-25 DIAGNOSIS — H25811 Combined forms of age-related cataract, right eye: Secondary | ICD-10-CM | POA: Diagnosis not present

## 2021-05-25 DIAGNOSIS — H2511 Age-related nuclear cataract, right eye: Secondary | ICD-10-CM | POA: Diagnosis not present

## 2021-05-25 DIAGNOSIS — Z7982 Long term (current) use of aspirin: Secondary | ICD-10-CM | POA: Diagnosis not present

## 2021-05-25 DIAGNOSIS — E1136 Type 2 diabetes mellitus with diabetic cataract: Secondary | ICD-10-CM | POA: Insufficient documentation

## 2021-05-25 HISTORY — PX: CATARACT EXTRACTION W/PHACO: SHX586

## 2021-05-25 LAB — GLUCOSE, CAPILLARY
Glucose-Capillary: 111 mg/dL — ABNORMAL HIGH (ref 70–99)
Glucose-Capillary: 96 mg/dL (ref 70–99)

## 2021-05-25 SURGERY — PHACOEMULSIFICATION, CATARACT, WITH IOL INSERTION
Anesthesia: Monitor Anesthesia Care | Site: Eye | Laterality: Right

## 2021-05-25 MED ORDER — FENTANYL CITRATE (PF) 100 MCG/2ML IJ SOLN
INTRAMUSCULAR | Status: DC | PRN
  Administered 2021-05-25: 50 ug via INTRAVENOUS

## 2021-05-25 MED ORDER — MIDAZOLAM HCL 2 MG/2ML IJ SOLN
INTRAMUSCULAR | Status: DC | PRN
  Administered 2021-05-25: 2 mg via INTRAVENOUS

## 2021-05-25 MED ORDER — BRIMONIDINE TARTRATE-TIMOLOL 0.2-0.5 % OP SOLN
OPHTHALMIC | Status: DC | PRN
  Administered 2021-05-25: 1 [drp] via OPHTHALMIC

## 2021-05-25 MED ORDER — LACTATED RINGERS IV SOLN: INTRAVENOUS | Status: DC

## 2021-05-25 MED ORDER — SIGHTPATH DOSE#1 BSS IO SOLN
INTRAOCULAR | Status: DC | PRN
  Administered 2021-05-25: 1 mL

## 2021-05-25 MED ORDER — TETRACAINE HCL 0.5 % OP SOLN
1.0000 [drp] | OPHTHALMIC | Status: DC | PRN
  Administered 2021-05-25 (×3): 1 [drp] via OPHTHALMIC

## 2021-05-25 MED ORDER — SIGHTPATH DOSE#1 BSS IO SOLN
INTRAOCULAR | Status: DC | PRN
  Administered 2021-05-25: 15 mL

## 2021-05-25 MED ORDER — SIGHTPATH DOSE#1 NA HYALUR & NA CHOND-NA HYALUR IO KIT
PACK | INTRAOCULAR | Status: DC | PRN
  Administered 2021-05-25: 1 via OPHTHALMIC

## 2021-05-25 MED ORDER — SIGHTPATH DOSE#1 BSS IO SOLN
INTRAOCULAR | Status: DC | PRN
  Administered 2021-05-25: 51 mL via OPHTHALMIC

## 2021-05-25 MED ORDER — ARMC OPHTHALMIC DILATING DROPS
1.0000 "application " | OPHTHALMIC | Status: DC | PRN
  Administered 2021-05-25 (×3): 1 via OPHTHALMIC

## 2021-05-25 MED ORDER — CEFUROXIME OPHTHALMIC INJECTION 1 MG/0.1 ML
INJECTION | OPHTHALMIC | Status: DC | PRN
  Administered 2021-05-25: 0.1 mL via INTRACAMERAL

## 2021-05-25 SURGICAL SUPPLY — 15 items
CANNULA ANT/CHMB 27GA (MISCELLANEOUS) IMPLANT
GLOVE SRG 8 PF TXTR STRL LF DI (GLOVE) ×1 IMPLANT
GLOVE SURG ENC TEXT LTX SZ7.5 (GLOVE) ×2 IMPLANT
GLOVE SURG UNDER POLY LF SZ8 (GLOVE) ×2
GOWN STRL REUS W/ TWL LRG LVL3 (GOWN DISPOSABLE) ×2 IMPLANT
GOWN STRL REUS W/TWL LRG LVL3 (GOWN DISPOSABLE) ×4
LENS IOL TECNIS EYHANCE 15.5 (Intraocular Lens) ×2 IMPLANT
MARKER SKIN DUAL TIP RULER LAB (MISCELLANEOUS) ×2 IMPLANT
NEEDLE FILTER BLUNT 18X 1/2SAF (NEEDLE) ×2
NEEDLE FILTER BLUNT 18X1 1/2 (NEEDLE) ×2 IMPLANT
PACK EYE AFTER SURG (MISCELLANEOUS) IMPLANT
SYR 3ML LL SCALE MARK (SYRINGE) ×4 IMPLANT
SYR TB 1ML LUER SLIP (SYRINGE) ×2 IMPLANT
WATER STERILE IRR 250ML POUR (IV SOLUTION) ×2 IMPLANT
WIPE NON LINTING 3.25X3.25 (MISCELLANEOUS) ×2 IMPLANT

## 2021-05-25 NOTE — Transfer of Care (Signed)
Immediate Anesthesia Transfer of Care Note  Patient: Ivan Kennedy  Procedure(s) Performed: CATARACT EXTRACTION PHACO AND INTRAOCULAR LENS PLACEMENT (IOC) RIGHT DIABETIC 6.64 00:54.7 (Right: Eye)  Patient Location: PACU  Anesthesia Type: MAC  Level of Consciousness: awake, alert  and patient cooperative  Airway and Oxygen Therapy: Patient Spontanous Breathing and Patient connected to supplemental oxygen  Post-op Assessment: Post-op Vital signs reviewed, Patient's Cardiovascular Status Stable, Respiratory Function Stable, Patent Airway and No signs of Nausea or vomiting  Post-op Vital Signs: Reviewed and stable  Complications: No notable events documented.

## 2021-05-25 NOTE — Op Note (Signed)
  LOCATION:  [place]   PREOPERATIVE DIAGNOSIS:    Nuclear sclerotic cataract right eye. H25.11   POSTOPERATIVE DIAGNOSIS:  Nuclear sclerotic cataract right eye.     PROCEDURE:  Phacoemusification with posterior chamber intraocular lens placement of the right eye   ULTRASOUND TIME: Procedure(s) with comments: CATARACT EXTRACTION PHACO AND INTRAOCULAR LENS PLACEMENT (IOC) RIGHT DIABETIC 6.64 00:54.7 (Right) - Diabetic, sleep apnea  LENS:   Implant Name Type Inv. Item Serial No. Manufacturer Lot No. LRB No. Used Action  LENS IOL TECNIS EYHANCE 15.5 - [id number] Intraocular Lens LENS IOL TECNIS EYHANCE 15.5 [id number] JOHNSON   Right 1 Implanted         SURGEON:  Wyonia Hough, MD   ANESTHESIA:  Topical with tetracaine drops and 2% Xylocaine jelly, augmented with 1% preservative-free intracameral lidocaine.    COMPLICATIONS:  None.   DESCRIPTION OF PROCEDURE:  The patient was identified in the holding room and transported to the operating room and placed in the supine position under the operating microscope.  The right eye was identified as the operative eye and it was prepped and draped in the usual sterile ophthalmic fashion.   A 1 millimeter clear-corneal paracentesis was made at the 12:00 position.  0.5 ml of preservative-free 1% lidocaine was injected into the anterior chamber. The anterior chamber was filled with Viscoat viscoelastic.  A 2.4 millimeter keratome was used to make a near-clear corneal incision at the 9:00 position.  A curvilinear capsulorrhexis was made with a cystotome and capsulorrhexis forceps.  Balanced salt solution was used to hydrodissect and hydrodelineate the nucleus.   Phacoemulsification was then used in stop and chop fashion to remove the lens nucleus and epinucleus.  The remaining cortex was then removed using the irrigation and aspiration handpiece. Provisc was then placed into the capsular bag to distend it for lens placement.  A  lens was then injected into the capsular bag.  The remaining viscoelastic was aspirated.   Wounds were hydrated with balanced salt solution.  The anterior chamber was inflated to a physiologic pressure with balanced salt solution.  No wound leaks were noted. Cefuroxime 0.1 ml of a 10mg /ml solution was injected into the anterior chamber for a dose of 1 mg of intracameral antibiotic at the completion of the case.   Timolol and Brimonidine drops were applied to the eye.  The patient was taken to the recovery room in stable condition without complications of anesthesia or surgery.   Huntley Knoop 05/25/2021, 10:30 AM

## 2021-05-25 NOTE — Anesthesia Postprocedure Evaluation (Signed)
Anesthesia Post Note  Patient: Ivan Kennedy  Procedure(s) Performed: CATARACT EXTRACTION PHACO AND INTRAOCULAR LENS PLACEMENT (IOC) RIGHT DIABETIC 6.64 00:54.7 (Right: Eye)     Patient location during evaluation: PACU Anesthesia Type: MAC Level of consciousness: awake and alert Pain management: pain level controlled Vital Signs Assessment: post-procedure vital signs reviewed and stable Respiratory status: spontaneous breathing, nonlabored ventilation, respiratory function stable and patient connected to nasal cannula oxygen Cardiovascular status: stable and blood pressure returned to baseline Postop Assessment: no apparent nausea or vomiting Anesthetic complications: no   No notable events documented.  Sinda Du

## 2021-05-25 NOTE — H&P (Signed)
[facility]   Primary Care Physician:  Virginia Crews, MD Ophthalmologist: Dr. Leandrew Koyanagi  Pre-Procedure History & Physical: HPI:  Ivan Kennedy is a 75 y.o. male here for ophthalmic surgery.   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 Earnestine Leys   shoulder spurs Right 2000    Prior to Admission medications   Medication Sig Start Date End Date Taking? Authorizing Provider  aspirin EC 81 MG tablet Take 81 mg by mouth daily.    Yes [provider]  doxycycline (VIBRAMYCIN) 50 MG capsule Take 50 mg by mouth 2 (two) times daily.   Yes [provider]  glimepiride (AMARYL) 4 MG tablet Take 1 tablet (4 mg total) by mouth daily with breakfast. 03/24/21  Yes Bacigalupo, Dionne Bucy, MD  loratadine (CLARITIN) 10 MG tablet Take 10 mg by mouth daily.   Yes [provider]  losartan (COZAAR) 100 MG tablet TAKE 1 TABLET BY MOUTH EVERY DAY 04/21/21  Yes Bacigalupo, Dionne Bucy, MD  metoprolol succinate (TOPROL-XL) 25 MG 24 hr tablet TAKE 1 TABLET (25 MG TOTAL) BY MOUTH DAILY. 05/10/21  Yes Bacigalupo, Dionne Bucy, MD  metroNIDAZOLE (METROGEL) 1 % gel Apply 1 application topically every morning. 03/30/15  Yes [provider]  omeprazole (PRILOSEC) 40 MG capsule TAKE 1 CAPSULE BY MOUTH EVERY DAY 05/23/21  Yes Bacigalupo, Dionne Bucy, MD  pioglitazone (ACTOS) 45 MG tablet TAKE 1 TABLET BY MOUTH EVERY DAY 05/23/21  Yes Bacigalupo, Dionne Bucy, MD  Semaglutide (RYBELSUS) 14  MG TABS TAKE 14 MG (1 TABLET) BY MOUTH DAILY BEFORE BREAKFAST. TAKE ON AN EMPTY STOMACH WITH A SIP OF WATER 03/11/21  Yes Bacigalupo, Dionne Bucy, MD  simvastatin (ZOCOR) 20 MG tablet TAKE 1 TABLET BY MOUTH EVERY DAY IN THE EVENING 12/24/20  Yes Bacigalupo, Dionne Bucy, MD  tretinoin (RETIN-A) 0.05 % cream Apply 1 application topically at bedtime. 09/24/20  Yes Mar Daring, PA-C  vitamin B-12 (CYANOCOBALAMIN) 1000 MCG tablet Take 1,000 mcg by mouth daily.   Yes [provider]  Blood Glucose Monitoring Suppl (ONE TOUCH ULTRA 2) w/Device KIT To check blood sugar once daily 03/25/20   Mar Daring, PA-C  Lancets (ACCU-CHEK SOFT TOUCH) lancets To check blood glucose daily 08/22/17   Mar Daring, PA-C  ONETOUCH ULTRA test strip CHECK BLOOD SUGAR ONCE DAILY 06/22/20   Mar Daring, PA-C    Allergies as of 04/06/2021   (No Known Allergies)    Family History  Problem Relation Age of Onset   Diabetes Mother     Social History   Socioeconomic History   Marital status: Married    Spouse name: Not on file   Number of children: 1   Years of education: Not on file   Highest education level: GED or equivalent  Occupational History   Occupation: retired  Tobacco Use   Smoking status: Never   Smokeless tobacco: Never  Vaping Use   Vaping Use: Never used  Substance and Sexual Activity   Alcohol use: Yes    Alcohol/week: 0.0 standard drinks    Comment: occasionally- beer 1-2/month   Drug use: No   Sexual activity: Not on file  Other Topics Concern   Not on file  Social History Narrative   Not on file   Social Determinants of Health   Financial Resource Strain: Low Risk    Difficulty of Paying Living Expenses: Not hard at all  Food Insecurity: Not on file  Transportation Needs: Not on file  Physical Activity: Not on file  Stress: Not on file  Social Connections: Not on file  Intimate Partner Violence: Not on file    Review of Systems: See HPI,  otherwise negative ROS  Physical Exam: BP (!) 173/99   Pulse 68   Temp 97.9 F (36.6 C) (Temporal)   Ht _0  (1.854 m)   Wt 110.4 kg   SpO2 100%   BMI 32.10 kg/m  General:   Alert,  pleasant and cooperative in NAD Head:  Normocephalic and atraumatic. Lungs:  Clear to auscultation.    Heart:  Regular rate and rhythm.   Impression/Plan: Ivan Kennedy is here for ophthalmic surgery.  Risks, benefits, limitations, and alternatives regarding ophthalmic surgery have been reviewed with the patient.  Questions have been answered.  All parties agreeable.   Leandrew Koyanagi, MD  05/25/2021, 9:09 AM

## 2021-05-25 NOTE — Anesthesia Preprocedure Evaluation (Signed)
Anesthesia Evaluation  Patient identified by MRN, date of birth, ID band Patient awake    Reviewed: Allergy & Precautions, H&P , NPO status , Patient's Chart, lab work & pertinent test results, reviewed documented beta blocker date and time   Airway Mallampati: II   Neck ROM: full    Dental  (+) Poor Dentition   Pulmonary sleep apnea ,    Pulmonary exam normal breath sounds clear to auscultation       Cardiovascular Exercise Tolerance: Good hypertension, On Medications negative cardio ROS Normal cardiovascular exam Rhythm:regular Rate:Normal     Neuro/Psych negative neurological ROS  negative psych ROS   GI/Hepatic Neg liver ROS, GERD  Medicated,  Endo/Other  negative endocrine ROSdiabetes, Well Controlled, Type 2, Oral Hypoglycemic Agents  Renal/GU negative Renal ROS  negative genitourinary   Musculoskeletal  (+) Arthritis ,   Abdominal   Peds  Hematology negative hematology ROS (+)   Anesthesia Other Findings Past Medical History: 2005: Colon polyp No date: Diabetes mellitus ([place]) No date: GERD (gastroesophageal reflux disease) No date: Hyperlipidemia No date: Hypertension No date: Rosacea No date: Sleep apnea Past Surgical History: 1998: ANAL FISSURE REPAIR 2005, 2010: COLONOSCOPY 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 Earnestine Leys 2000: shoulder spurs; Right BMI    Body Mass Index: 30.11 kg/m     Reproductive/Obstetrics negative OB ROS                             Anesthesia Physical  Anesthesia Plan  ASA: III  Anesthesia Plan: MAC   Post-op Pain Management:    Induction:   PONV Risk Score and Plan: 1 and Treatment may vary due to age or medical condition and Midazolam  Airway Management Planned: Nasal Cannula and Natural Airway  Additional Equipment:   Intra-op Plan:   Post-operative Plan:   Informed Consent: I have reviewed the  patients History and Physical, chart, labs and discussed the procedure including the risks, benefits and alternatives for the proposed anesthesia with the patient or authorized representative who has indicated his/her understanding and acceptance.     Dental Advisory Given  Plan Discussed with: CRNA  Anesthesia Plan Comments:         Anesthesia Quick Evaluation  Patient Active Problem List   Diagnosis Date Noted  . Obesity 03/03/2021  . Benign paroxysmal positional vertigo 12/23/2020  . Hallux valgus 07/31/2018  . IBS (irritable bowel syndrome) 01/21/2015  . B12 deficiency 01/07/2015  . Hyperlipidemia associated with type 2 diabetes mellitus ([place]) 01/07/2015  . Cannot sleep 01/07/2015  . Fatty liver disease, nonalcoholic 06/03/1172  . Osteoarthritis of knee 01/07/2015  . Acne erythematosa 01/07/2015  . Disorder of male genital organs 01/07/2015  . Atypical chest pain 10/09/2014  . Acid reflux 09/06/2009  . Diabetes mellitus, type 2 ([place]) 11/20/2008  . Colon, diverticulosis 11/20/2008  . Hypertension associated with diabetes ([place]) 11/20/2008  . Apnea, sleep 11/20/2008  . History of artificial joint 11/20/2008    CBC Latest Ref Rng & Units 11/06/2019 10/30/2018 10/30/2017  WBC 3.4 - 10.8 x10E3/uL 6.5 6.1 5.4  Hemoglobin 13.0 - 17.7 g/dL 13.5 13.8 14.2  Hematocrit 37.5 - 51.0 % 38.2 39.7 41.6  Platelets 150 - 450 x10E3/uL 172 150 145(L)   BMP Latest Ref Rng & Units 12/23/2020 11/06/2019 10/30/2018  Glucose 65 - 99 mg/dL 172(H) 133(H) 146(H)  BUN 8 - 27 mg/dL _0 Creatinine 0.76 -  1.27 mg/dL 1.19 1.03 1.03  BUN/Creat Ratio 10 - _0 Sodium 134 - 144 mmol/L 141 144 142  Potassium 3.5 - 5.2 mmol/L 3.6 2.9(L) 3.4(L)  Chloride 96 - 106 mmol/L 101 100 101  CO2 20 - 29 mmol/L _1 Calcium 8.6 - 10.2 mg/dL 9.9 9.2 9.3    Risks and benefits of anesthesia discussed at length, patient or surrogate demonstrates understanding. Appropriately NPO. Plan to proceed  with anesthesia.  Champ Mungo, MD 05/25/21

## 2021-05-26 ENCOUNTER — Encounter: Payer: Self-pay | Admitting: Ophthalmology

## 2021-05-30 DIAGNOSIS — H2512 Age-related nuclear cataract, left eye: Secondary | ICD-10-CM | POA: Diagnosis not present

## 2021-06-04 DIAGNOSIS — G4733 Obstructive sleep apnea (adult) (pediatric): Secondary | ICD-10-CM | POA: Diagnosis not present

## 2021-06-07 NOTE — Discharge Instructions (Signed)

## 2021-06-08 ENCOUNTER — Encounter: Payer: Self-pay | Admitting: Ophthalmology

## 2021-06-08 ENCOUNTER — Ambulatory Visit: Payer: PPO | Admitting: Anesthesiology

## 2021-06-08 ENCOUNTER — Other Ambulatory Visit: Payer: Self-pay

## 2021-06-08 ENCOUNTER — Ambulatory Visit
Admission: RE | Admit: 2021-06-08 | Discharge: 2021-06-08 | Disposition: A | Discharge status: Home / Self Care | Payer: PPO | Attending: Ophthalmology | Admitting: Ophthalmology

## 2021-06-08 ENCOUNTER — Encounter
Admission: RE | Disposition: A | Discharge status: Home / Self Care | Payer: Self-pay | Source: Home / Self Care | Attending: Ophthalmology

## 2021-06-08 DIAGNOSIS — H2512 Age-related nuclear cataract, left eye: Secondary | ICD-10-CM | POA: Insufficient documentation

## 2021-06-08 DIAGNOSIS — E785 Hyperlipidemia, unspecified: Secondary | ICD-10-CM | POA: Diagnosis not present

## 2021-06-08 DIAGNOSIS — Z7984 Long term (current) use of oral hypoglycemic drugs: Secondary | ICD-10-CM | POA: Diagnosis not present

## 2021-06-08 DIAGNOSIS — I10 Essential (primary) hypertension: Secondary | ICD-10-CM | POA: Diagnosis not present

## 2021-06-08 DIAGNOSIS — G4730 Sleep apnea, unspecified: Secondary | ICD-10-CM | POA: Insufficient documentation

## 2021-06-08 DIAGNOSIS — Z7982 Long term (current) use of aspirin: Secondary | ICD-10-CM | POA: Insufficient documentation

## 2021-06-08 DIAGNOSIS — K219 Gastro-esophageal reflux disease without esophagitis: Secondary | ICD-10-CM | POA: Diagnosis not present

## 2021-06-08 DIAGNOSIS — E1136 Type 2 diabetes mellitus with diabetic cataract: Secondary | ICD-10-CM | POA: Insufficient documentation

## 2021-06-08 DIAGNOSIS — H25812 Combined forms of age-related cataract, left eye: Secondary | ICD-10-CM | POA: Diagnosis not present

## 2021-06-08 HISTORY — PX: CATARACT EXTRACTION W/PHACO: SHX586

## 2021-06-08 LAB — GLUCOSE, CAPILLARY
Glucose-Capillary: 123 mg/dL — ABNORMAL HIGH (ref 70–99)
Glucose-Capillary: 109 mg/dL — ABNORMAL HIGH (ref 70–99)

## 2021-06-08 SURGERY — PHACOEMULSIFICATION, CATARACT, WITH IOL INSERTION
Anesthesia: Monitor Anesthesia Care | Site: Eye | Laterality: Left

## 2021-06-08 MED ORDER — ARMC OPHTHALMIC DILATING DROPS
1.0000 "application " | OPHTHALMIC | Status: DC | PRN
  Administered 2021-06-08 (×3): 1 via OPHTHALMIC

## 2021-06-08 MED ORDER — CEFUROXIME OPHTHALMIC INJECTION 1 MG/0.1 ML
INJECTION | OPHTHALMIC | Status: DC | PRN
  Administered 2021-06-08: 0.1 mL via OPHTHALMIC

## 2021-06-08 MED ORDER — FENTANYL CITRATE (PF) 100 MCG/2ML IJ SOLN
INTRAMUSCULAR | Status: DC | PRN
  Administered 2021-06-08: 50 ug via INTRAVENOUS

## 2021-06-08 MED ORDER — SIGHTPATH DOSE#1 NA HYALUR & NA CHOND-NA HYALUR IO KIT
PACK | INTRAOCULAR | Status: DC | PRN
  Administered 2021-06-08: 1 via OPHTHALMIC

## 2021-06-08 MED ORDER — LACTATED RINGERS IV SOLN: INTRAVENOUS | Status: DC

## 2021-06-08 MED ORDER — TETRACAINE HCL 0.5 % OP SOLN
1.0000 [drp] | OPHTHALMIC | Status: DC | PRN
  Administered 2021-06-08 (×3): 1 [drp] via OPHTHALMIC

## 2021-06-08 MED ORDER — MIDAZOLAM HCL 2 MG/2ML IJ SOLN
INTRAMUSCULAR | Status: DC | PRN
  Administered 2021-06-08 (×2): 1 mg via INTRAVENOUS

## 2021-06-08 MED ORDER — SIGHTPATH DOSE#1 BSS IO SOLN
INTRAOCULAR | Status: DC | PRN
  Administered 2021-06-08: 15 mL

## 2021-06-08 MED ORDER — SIGHTPATH DOSE#1 BSS IO SOLN
INTRAOCULAR | Status: DC | PRN
  Administered 2021-06-08: 61 mL via OPHTHALMIC

## 2021-06-08 MED ORDER — SIGHTPATH DOSE#1 BSS IO SOLN
INTRAOCULAR | Status: DC | PRN
  Administered 2021-06-08: 1 mL via INTRAMUSCULAR

## 2021-06-08 MED ORDER — BRIMONIDINE TARTRATE-TIMOLOL 0.2-0.5 % OP SOLN
OPHTHALMIC | Status: DC | PRN
  Administered 2021-06-08: 1 [drp] via OPHTHALMIC

## 2021-06-08 SURGICAL SUPPLY — 15 items
CANNULA ANT/CHMB 27GA (MISCELLANEOUS) ×2 IMPLANT
GLOVE SRG 8 PF TXTR STRL LF DI (GLOVE) ×1 IMPLANT
GLOVE SURG ENC TEXT LTX SZ7.5 (GLOVE) ×2 IMPLANT
GLOVE SURG GAMMEX PI TX LF 7.5 (GLOVE) ×2 IMPLANT
GLOVE SURG UNDER POLY LF SZ8 (GLOVE) ×2
GOWN STRL REUS W/ TWL LRG LVL3 (GOWN DISPOSABLE) ×2 IMPLANT
GOWN STRL REUS W/TWL LRG LVL3 (GOWN DISPOSABLE) ×4
LENS IOL TECNIS EYHANCE 16.0 (Intraocular Lens) ×2 IMPLANT
MARKER SKIN DUAL TIP RULER LAB (MISCELLANEOUS) ×2 IMPLANT
NEEDLE FILTER BLUNT 18X 1/2SAF (NEEDLE) ×2
NEEDLE FILTER BLUNT 18X1 1/2 (NEEDLE) ×2 IMPLANT
SYR 3ML LL SCALE MARK (SYRINGE) ×4 IMPLANT
SYR TB 1ML LUER SLIP (SYRINGE) ×2 IMPLANT
WATER STERILE IRR 250ML POUR (IV SOLUTION) ×2 IMPLANT
WIPE NON LINTING 3.25X3.25 (MISCELLANEOUS) ×2 IMPLANT

## 2021-06-08 NOTE — Anesthesia Postprocedure Evaluation (Signed)
Anesthesia Post Note  Patient: Ivan Kennedy  Procedure(s) Performed: CATARACT EXTRACTION PHACO AND INTRAOCULAR LENS PLACEMENT (IOC) LEFT DIABETIC (Left: Eye)     Patient location during evaluation: PACU Anesthesia Type: MAC Level of consciousness: awake and alert Pain management: pain level controlled Vital Signs Assessment: post-procedure vital signs reviewed and stable Respiratory status: spontaneous breathing Cardiovascular status: stable Anesthetic complications: no   No notable events documented.  Gillian Scarce

## 2021-06-08 NOTE — Transfer of Care (Signed)
Immediate Anesthesia Transfer of Care Note  Patient: Ivan Kennedy  Procedure(s) Performed: CATARACT EXTRACTION PHACO AND INTRAOCULAR LENS PLACEMENT (IOC) LEFT DIABETIC (Left: Eye)  Patient Location: PACU  Anesthesia Type: MAC  Level of Consciousness: awake, alert  and patient cooperative  Airway and Oxygen Therapy: Patient Spontanous Breathing and Patient connected to supplemental oxygen  Post-op Assessment: Post-op Vital signs reviewed, Patient's Cardiovascular Status Stable, Respiratory Function Stable, Patent Airway and No signs of Nausea or vomiting  Post-op Vital Signs: Reviewed and stable  Complications: No notable events documented.

## 2021-06-08 NOTE — Anesthesia Procedure Notes (Signed)
Procedure Name: MAC Date/Time: 06/08/2021 9:43 AM Performed by: Dionne Bucy, CRNA Pre-anesthesia Checklist: Patient identified, Emergency Drugs available, Suction available, Patient being monitored and Timeout performed Patient Re-evaluated:Patient Re-evaluated prior to induction Oxygen Delivery Method: Nasal cannula Placement Confirmation: positive ETCO2

## 2021-06-08 NOTE — Op Note (Signed)
OPERATIVE NOTE  Ivan Kennedy [id number] 06/08/2021   PREOPERATIVE DIAGNOSIS:  Nuclear sclerotic cataract left eye. H25.12   POSTOPERATIVE DIAGNOSIS:    Nuclear sclerotic cataract left eye.     PROCEDURE:  Phacoemusification with posterior chamber intraocular lens placement of the left eye  Ultrasound time: Procedure(s) with comments: CATARACT EXTRACTION PHACO AND INTRAOCULAR LENS PLACEMENT (IOC) LEFT DIABETIC (Left) - Diabetic, sleep apnea 7.57 01:02.2  LENS:   Implant Name Type Inv. Item Serial No. Manufacturer Lot No. LRB No. Used Action  LENS IOL TECNIS EYHANCE 16.0 - [id number] Intraocular Lens LENS IOL TECNIS EYHANCE 16.0 [id number]   Left 1 Implanted      SURGEON:  Wyonia Hough, MD   ANESTHESIA:  Topical with tetracaine drops and 2% Xylocaine jelly, augmented with 1% preservative-free intracameral lidocaine.    COMPLICATIONS:  None.   DESCRIPTION OF PROCEDURE:  The patient was identified in the holding room and transported to the operating room and placed in the supine position under the operating microscope.  The left eye was identified as the operative eye and it was prepped and draped in the usual sterile ophthalmic fashion.   A 1 millimeter clear-corneal paracentesis was made at the 1:30 position.  0.5 ml of preservative-free 1% lidocaine was injected into the anterior chamber.  The anterior chamber was filled with Viscoat viscoelastic.  A 2.4 millimeter keratome was used to make a near-clear corneal incision at the 10:30 position.  .  A curvilinear capsulorrhexis was made with a cystotome and capsulorrhexis forceps.  Balanced salt solution was used to hydrodissect and hydrodelineate the nucleus.   Phacoemulsification was then used in stop and chop fashion to remove the lens nucleus and epinucleus.  The remaining cortex was then removed using the irrigation and aspiration handpiece. Provisc was then placed into the capsular bag to distend it for lens  placement.  A lens was then injected into the capsular bag.  The remaining viscoelastic was aspirated.   Wounds were hydrated with balanced salt solution.  The anterior chamber was inflated to a physiologic pressure with balanced salt solution.  No wound leaks were noted. Cefuroxime 0.1 ml of a 10mg /ml solution was injected into the anterior chamber for a dose of 1 mg of intracameral antibiotic at the completion of the case.   Timolol and Brimonidine drops were applied to the eye.  The patient was taken to the recovery room in stable condition without complications of anesthesia or surgery.  Heide Brossart 06/08/2021, 10:05 AM

## 2021-06-08 NOTE — Anesthesia Preprocedure Evaluation (Signed)
Anesthesia Evaluation  Patient identified by MRN, date of birth, ID band Patient awake    Reviewed: Allergy & Precautions, H&P , NPO status , Patient's Chart, lab work & pertinent test results  Airway Mallampati: II  TM Distance: >3 FB Neck ROM: full    Dental no notable dental hx.    Pulmonary sleep apnea ,    Pulmonary exam normal        Cardiovascular hypertension, On Medications Normal cardiovascular exam Rhythm:regular Rate:Normal     Neuro/Psych negative neurological ROS     GI/Hepatic Neg liver ROS, Medicated,  Endo/Other  diabetes, Well Controlled  Renal/GU      Musculoskeletal   Abdominal   Peds  Hematology negative hematology ROS (+)   Anesthesia Other Findings   Reproductive/Obstetrics negative OB ROS                             Anesthesia Physical Anesthesia Plan  ASA: 2  Anesthesia Plan: MAC   Post-op Pain Management:    Induction:   PONV Risk Score and Plan: 1 and Midazolam, TIVA and Treatment may vary due to age or medical condition  Airway Management Planned:   Additional Equipment:   Intra-op Plan:   Post-operative Plan:   Informed Consent: I have reviewed the patients History and Physical, chart, labs and discussed the procedure including the risks, benefits and alternatives for the proposed anesthesia with the patient or authorized representative who has indicated his/her understanding and acceptance.       Plan Discussed with:   Anesthesia Plan Comments:         Anesthesia Quick Evaluation

## 2021-06-08 NOTE — H&P (Signed)
[facility]   Primary Care Physician:  Virginia Crews, MD Ophthalmologist: Dr. Leandrew Koyanagi  Pre-Procedure History & Physical: HPI:  Ivan Kennedy is a 75 y.o. male here for ophthalmic surgery.   Past Medical History:  Diagnosis Date   Colon polyp 2005   Diabetes mellitus ([place])    GERD (gastroesophageal reflux disease)    Hyperlipidemia    Hypertension    Rosacea    Sleep apnea    CPAP    Past Surgical History:  Procedure Laterality Date   ANAL FISSURE REPAIR  1998   CATARACT EXTRACTION W/PHACO Right 05/25/2021   Procedure: CATARACT EXTRACTION PHACO AND INTRAOCULAR LENS PLACEMENT ([place]) RIGHT DIABETIC 6.64 00:54.7;  Surgeon: Leandrew Koyanagi, MD;  Location: [place];  Service: Ophthalmology;  Laterality: Right;  Diabetic, sleep apnea   COLONOSCOPY  2005, 2010   COLONOSCOPY WITH PROPOFOL N/A 01/19/2020   Procedure: COLONOSCOPY WITH PROPOFOL;  Surgeon: Jonathon Bellows, MD;  Location[facility] ENDOSCOPY;  Service: Gastroenterology;  Laterality: N/A;   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 Earnestine Leys   shoulder spurs Right 2000    Prior to Admission medications   Medication Sig Start Date End Date Taking? Authorizing Provider  aspirin EC 81 MG tablet Take 81 mg by mouth daily.    Yes [provider]  doxycycline (VIBRAMYCIN) 50 MG capsule Take 50 mg by mouth 2 (two) times daily.   Yes [provider]  glimepiride (AMARYL) 4 MG tablet Take 1 tablet (4 mg total) by mouth daily with breakfast. 03/24/21  Yes Bacigalupo, Dionne Bucy, MD  loratadine (CLARITIN) 10 MG tablet Take 10 mg by mouth daily.   Yes [provider]  losartan (COZAAR) 100 MG tablet TAKE 1 TABLET BY MOUTH EVERY DAY 04/21/21  Yes Bacigalupo, Dionne Bucy, MD  metoprolol succinate (TOPROL-XL) 25 MG 24 hr tablet TAKE 1 TABLET (25 MG TOTAL) BY MOUTH DAILY. 05/10/21  Yes Bacigalupo, Dionne Bucy, MD  omeprazole (PRILOSEC) 40 MG capsule TAKE 1 CAPSULE BY MOUTH EVERY DAY  05/23/21  Yes Bacigalupo, Dionne Bucy, MD  pioglitazone (ACTOS) 45 MG tablet TAKE 1 TABLET BY MOUTH EVERY DAY 05/23/21  Yes Bacigalupo, Dionne Bucy, MD  Semaglutide (RYBELSUS) 14 MG TABS TAKE 14 MG (1 TABLET) BY MOUTH DAILY BEFORE BREAKFAST. TAKE ON AN EMPTY STOMACH WITH A SIP OF WATER 03/11/21  Yes Bacigalupo, Dionne Bucy, MD  simvastatin (ZOCOR) 20 MG tablet TAKE 1 TABLET BY MOUTH EVERY DAY IN THE EVENING 12/24/20  Yes Bacigalupo, Dionne Bucy, MD  vitamin B-12 (CYANOCOBALAMIN) 1000 MCG tablet Take 1,000 mcg by mouth daily.   Yes [provider]  Blood Glucose Monitoring Suppl (ONE TOUCH ULTRA 2) w/Device KIT To check blood sugar once daily 03/25/20   Mar Daring, PA-C  Lancets (ACCU-CHEK SOFT TOUCH) lancets To check blood glucose daily 08/22/17   Mar Daring, PA-C  metroNIDAZOLE (METROGEL) 1 % gel Apply 1 application topically every morning. 03/30/15   [provider]  ONETOUCH ULTRA test strip CHECK BLOOD SUGAR ONCE DAILY 06/22/20   Mar Daring, PA-C  tretinoin (RETIN-A) 0.05 % cream Apply 1 application topically at bedtime. 09/24/20   Mar Daring, PA-C    Allergies as of 04/06/2021   (No Known Allergies)    Family History  Problem Relation Age of Onset   Diabetes Mother     Social History   Socioeconomic History   Marital status: Married    Spouse name: Not on file  Number of children: 1   Years of education: Not on file   Highest education level: GED or equivalent  Occupational History   Occupation: retired  Tobacco Use   Smoking status: Never   Smokeless tobacco: Never  Vaping Use   Vaping Use: Never used  Substance and Sexual Activity   Alcohol use: Yes    Alcohol/week: 0.0 standard drinks    Comment: occasionally- beer 1-2/month   Drug use: No   Sexual activity: Not on file  Other Topics Concern   Not on file  Social History Narrative   Not on file   Social Determinants of Health   Financial Resource Strain: Low Risk     Difficulty of Paying Living Expenses: Not hard at all  Food Insecurity: Not on file  Transportation Needs: Not on file  Physical Activity: Not on file  Stress: Not on file  Social Connections: Not on file  Intimate Partner Violence: Not on file    Review of Systems: See HPI, otherwise negative ROS  Physical Exam: BP (!) 169/99   Pulse 74   Temp 97.7 F (36.5 C)   Wt 108.4 kg   SpO2 97%   BMI 31.53 kg/m  General:   Alert,  pleasant and cooperative in NAD Head:  Normocephalic and atraumatic. Lungs:  Clear to auscultation.    Heart:  Regular rate and rhythm.   Impression/Plan: Ivan Kennedy is here for ophthalmic surgery.  Risks, benefits, limitations, and alternatives regarding ophthalmic surgery have been reviewed with the patient.  Questions have been answered.  All parties agreeable.   Leandrew Koyanagi, MD  06/08/2021, 8:47 AM

## 2021-06-09 ENCOUNTER — Encounter: Payer: Self-pay | Admitting: Ophthalmology

## 2021-06-13 DIAGNOSIS — D485 Neoplasm of uncertain behavior of skin: Secondary | ICD-10-CM | POA: Diagnosis not present

## 2021-06-13 DIAGNOSIS — L0102 Bockhart's impetigo: Secondary | ICD-10-CM | POA: Diagnosis not present

## 2021-06-13 DIAGNOSIS — L739 Follicular disorder, unspecified: Secondary | ICD-10-CM | POA: Diagnosis not present

## 2021-06-13 DIAGNOSIS — L570 Actinic keratosis: Secondary | ICD-10-CM | POA: Diagnosis not present

## 2021-06-13 DIAGNOSIS — H02831 Dermatochalasis of right upper eyelid: Secondary | ICD-10-CM | POA: Diagnosis not present

## 2021-06-16 ENCOUNTER — Other Ambulatory Visit: Payer: Self-pay | Admitting: Family Medicine

## 2021-06-16 DIAGNOSIS — E7800 Pure hypercholesterolemia, unspecified: Secondary | ICD-10-CM

## 2021-06-16 DIAGNOSIS — H0259 Other disorders affecting eyelid function: Secondary | ICD-10-CM | POA: Diagnosis not present

## 2021-06-16 NOTE — Telephone Encounter (Signed)
Requested Prescriptions  Pending Prescriptions Disp Refills  . simvastatin (ZOCOR) 20 MG tablet [Pharmacy Med Name: SIMVASTATIN 20 MG TABLET] 90 tablet 1    Sig: TAKE 1 TABLET BY MOUTH EVERY DAY IN THE EVENING     Cardiovascular:  Antilipid - Statins Passed - 06/16/2021  1:56 AM      Passed - Total Cholesterol in normal range and within 360 days    Cholesterol, Total  Date Value Ref Range Status  12/23/2020 132 100 - 199 mg/dL Final         Passed - LDL in normal range and within 360 days    LDL Cholesterol (Calc)  Date Value Ref Range Status  04/26/2017 38 mg/dL (calc) Final    Comment:    Reference range: <100 . Desirable range <100 mg/dL for primary prevention;   <70 mg/dL for patients with CHD or diabetic patients  with > or = 2 CHD risk factors. Marland Kitchen LDL-C is now calculated using the Martin-Hopkins  calculation, which is a validated novel method providing  better accuracy than the Friedewald equation in the  estimation of LDL-C.  Cresenciano Genre et al. Annamaria Helling. 0383;338(32): 2061-2068  (http://education.[facility].com/faq/FAQ164)    LDL Chol Calc (NIH)  Date Value Ref Range Status  12/23/2020 65 0 - 99 mg/dL Final         Passed - HDL in normal range and within 360 days    HDL  Date Value Ref Range Status  12/23/2020 45 >39 mg/dL Final         Passed - Triglycerides in normal range and within 360 days    Triglycerides  Date Value Ref Range Status  12/23/2020 126 0 - 149 mg/dL Final         Passed - Patient is not pregnant      Passed - Valid encounter within last 12 months    Recent Outpatient Visits          2 months ago Type 2 diabetes mellitus with hyperglycemia, without long-term current use of insulin ([place])   [facility] [place], Dionne Bucy, MD   3 months ago Hypertension associated with diabetes [facility])   [facility], Dionne Bucy, MD   5 months ago Encounter for annual wellness visit (AWV) in Medicare patient    [facility] [place], Dionne Bucy, MD   8 months ago Type 2 diabetes mellitus with hyperglycemia, without long-term current use of insulin [facility])   [place], [place], [place]   11 months ago Type 2 diabetes mellitus with hyperglycemia, without long-term current use of insulin [facility])   [place], Clearnce Sorrel, [place]      Future Appointments            In 1 week Bacigalupo, Dionne Bucy, MD [facility]

## 2021-06-28 ENCOUNTER — Other Ambulatory Visit: Payer: Self-pay

## 2021-06-28 ENCOUNTER — Encounter: Payer: Self-pay | Admitting: Family Medicine

## 2021-06-28 ENCOUNTER — Ambulatory Visit (INDEPENDENT_AMBULATORY_CARE_PROVIDER_SITE_OTHER): Payer: PPO | Admitting: Family Medicine

## 2021-06-28 VITALS — BP 144/86 | HR 72 | Temp 97.2°F | Resp 16 | Wt 243.0 lb

## 2021-06-28 DIAGNOSIS — E1169 Type 2 diabetes mellitus with other specified complication: Secondary | ICD-10-CM

## 2021-06-28 DIAGNOSIS — E669 Obesity, unspecified: Secondary | ICD-10-CM

## 2021-06-28 DIAGNOSIS — I152 Hypertension secondary to endocrine disorders: Secondary | ICD-10-CM | POA: Diagnosis not present

## 2021-06-28 DIAGNOSIS — Z6832 Body mass index (BMI) 32.0-32.9, adult: Secondary | ICD-10-CM

## 2021-06-28 DIAGNOSIS — E1159 Type 2 diabetes mellitus with other circulatory complications: Secondary | ICD-10-CM | POA: Diagnosis not present

## 2021-06-28 DIAGNOSIS — Z23 Encounter for immunization: Secondary | ICD-10-CM

## 2021-06-28 DIAGNOSIS — E785 Hyperlipidemia, unspecified: Secondary | ICD-10-CM

## 2021-06-28 LAB — POCT GLYCOSYLATED HEMOGLOBIN (HGB A1C)
Est. average glucose Bld gHb Est-mCnc: 137
Hemoglobin A1C: 6.4 % — AB (ref 4.0–5.6)

## 2021-06-28 MED ORDER — HYDROCHLOROTHIAZIDE 12.5 MG PO TABS: 12.5000 mg | ORAL_TABLET | Freq: Every day | ORAL | 3 refills | Status: DC

## 2021-06-28 NOTE — Assessment & Plan Note (Addendum)
Uncontrolled here and at home Continue losartan and metoprolol at current dose Add HCTZ 12.5mg  daily Check BMP F/u in 20m

## 2021-06-28 NOTE — Assessment & Plan Note (Signed)
Discussed importance of healthy weight management Discussed diet and exercise  

## 2021-06-28 NOTE — Assessment & Plan Note (Signed)
Chronic and well controlled Associated with HTN and HLD Control is improving today with no more hypoglycemic episodes Continue current medications Up-to-date on screenings and vaccinations

## 2021-06-28 NOTE — Assessment & Plan Note (Signed)
Chronic and stable Continue simvastatin Recheck FLP and CMP in 12/2021

## 2021-06-28 NOTE — Addendum Note (Signed)
Addended by: Doristine Devoid on: 06/28/2021 08:46 AM   Modules accepted: Orders

## 2021-06-28 NOTE — Progress Notes (Signed)
Established patient visit   Patient: Ivan Kennedy   DOB: 1945-08-21   75 y.o. Male  MRN: [id number] Visit Date: 06/28/2021  Today's healthcare provider: Lavon Paganini, MD   Chief Complaint  Patient presents with   Diabetes   Hypertension   Hyperlipidemia   Subjective    HPI  Diabetes Mellitus Type II, follow-up  Lab Results  Component Value Date   HGBA1C 6.4 (A) 06/28/2021   HGBA1C 5.9 (A) 03/24/2021   HGBA1C 8.0 (H) 12/23/2020   Last seen for diabetes 3 months ago.  Management since then includes continuing the same treatment. He reports excellent compliance with treatment. He is not having side effects.   Home blood sugar records: fasting range: 140's  Episodes of hypoglycemia? No    Current insulin regiment: none Most Recent Eye Exam: UTD  --------------------------------------------------------------------------------------------------- Hypertension, follow-up  BP Readings from Last 3 Encounters:  06/28/21 (!) 144/86  06/08/21 (!) 146/95  05/25/21 (!) 140/97   Wt Readings from Last 3 Encounters:  06/28/21 243 lb (110.2 kg)  06/08/21 239 lb (108.4 kg)  05/25/21 243 lb 4.8 oz (110.4 kg)     He was last seen for hypertension 3 months ago.  BP at that visit was 148/84. Management since that visit includes no changes. He reports excellent compliance with treatment. He is not having side effects.  He is exercising. He is not adherent to low salt diet.   Outside blood pressures are 140's/80's.  He does not smoke.  Use of agents associated with hypertension: none.   --------------------------------------------------------------------------------------------------- Lipid/Cholesterol, follow-up  Last Lipid Panel: Lab Results  Component Value Date   CHOL 132 12/23/2020   LDLCALC 65 12/23/2020   HDL 45 12/23/2020   TRIG 126 12/23/2020    He was last seen for this 3 months ago.  Management since that visit includes no changes.  He  reports excellent compliance with treatment. He is not having side effects.   Symptoms: No appetite changes No foot ulcerations  No chest pain No chest pressure/discomfort  No dyspnea No orthopnea  No fatigue No lower extremity edema  No palpitations No paroxysmal nocturnal dyspnea  No nausea No numbness or tingling of extremity  No polydipsia No polyuria  No speech difficulty No syncope   He is following a Regular diet. Current exercise: walking  Last metabolic panel Lab Results  Component Value Date   GLUCOSE 172 (H) 12/23/2020   NA 141 12/23/2020   K 3.6 12/23/2020   BUN 26 12/23/2020   CREATININE 1.19 12/23/2020   EGFR 64 12/23/2020   GFRNONAA 73 11/06/2019   CALCIUM 9.9 12/23/2020   AST 23 12/23/2020   ALT 25 12/23/2020   The 10-year ASCVD risk score (Arnett DK, et al., 2019) is: 45.5%  ---------------------------------------------------------------------------------------------------     Medications: Outpatient Medications Prior to Visit  Medication Sig   aspirin EC 81 MG tablet Take 81 mg by mouth daily.    Blood Glucose Monitoring Suppl (ONE TOUCH ULTRA 2) w/Device KIT To check blood sugar once daily   doxycycline (VIBRAMYCIN) 50 MG capsule Take 50 mg by mouth 2 (two) times daily.   glimepiride (AMARYL) 4 MG tablet Take 1 tablet (4 mg total) by mouth daily with breakfast.   Lancets (ACCU-CHEK SOFT TOUCH) lancets To check blood glucose daily   loratadine (CLARITIN) 10 MG tablet Take 10 mg by mouth daily.   losartan (COZAAR) 100 MG tablet TAKE 1 TABLET BY MOUTH EVERY DAY  metoprolol succinate (TOPROL-XL) 25 MG 24 hr tablet TAKE 1 TABLET (25 MG TOTAL) BY MOUTH DAILY.   metroNIDAZOLE (METROGEL) 1 % gel Apply 1 application topically every morning.   omeprazole (PRILOSEC) 40 MG capsule TAKE 1 CAPSULE BY MOUTH EVERY DAY   ONETOUCH ULTRA test strip CHECK BLOOD SUGAR ONCE DAILY   pioglitazone (ACTOS) 45 MG tablet TAKE 1 TABLET BY MOUTH EVERY DAY   Semaglutide  (RYBELSUS) 14 MG TABS TAKE 14 MG (1 TABLET) BY MOUTH DAILY BEFORE BREAKFAST. TAKE ON AN EMPTY STOMACH WITH A SIP OF WATER   simvastatin (ZOCOR) 20 MG tablet TAKE 1 TABLET BY MOUTH EVERY DAY IN THE EVENING   tretinoin (RETIN-A) 0.05 % cream Apply 1 application topically at bedtime.   vitamin B-12 (CYANOCOBALAMIN) 1000 MCG tablet Take 1,000 mcg by mouth daily.   No facility-administered medications prior to visit.    Review of Systems  Constitutional:  Negative for activity change, appetite change, diaphoresis, fatigue, fever and unexpected weight change.  HENT: Negative.    Eyes: Negative.  Negative for visual disturbance.  Respiratory:  Negative for chest tightness and shortness of breath.   Cardiovascular:  Negative for chest pain.  Gastrointestinal: Negative.   Endocrine: Negative for polydipsia, polyphagia and polyuria.  Genitourinary: Negative.   Musculoskeletal: Negative.   Skin: Negative.   Psychiatric/Behavioral: Negative.         Objective    BP (!) 144/86 (BP Location: Left Arm, Patient Position: Bed low/side rails up, Cuff Size: Large)   Pulse 72   Temp (!) 97.2 F (36.2 C) (Temporal)   Resp 16   Wt 243 lb (110.2 kg)   SpO2 99%   BMI 32.06 kg/m  BP Readings from Last 3 Encounters:  06/28/21 (!) 144/86  06/08/21 (!) 146/95  05/25/21 (!) 140/97   Wt Readings from Last 3 Encounters:  06/28/21 243 lb (110.2 kg)  06/08/21 239 lb (108.4 kg)  05/25/21 243 lb 4.8 oz (110.4 kg)      Physical Exam Vitals reviewed.  Constitutional:      General: He is not in acute distress.    Appearance: Normal appearance. He is not diaphoretic.  HENT:     Head: Normocephalic and atraumatic.  Eyes:     General: No scleral icterus.    Conjunctiva/sclera: Conjunctivae normal.  Cardiovascular:     Rate and Rhythm: Normal rate and regular rhythm.     Pulses: Normal pulses.     Heart sounds: Normal heart sounds. No murmur heard. Pulmonary:     Effort: Pulmonary effort is normal.  No respiratory distress.     Breath sounds: Normal breath sounds. No wheezing or rhonchi.  Abdominal:     General: There is no distension.     Palpations: Abdomen is soft.     Tenderness: There is no abdominal tenderness.  Musculoskeletal:     Cervical back: Neck supple.     Right lower leg: No edema.     Left lower leg: No edema.  Lymphadenopathy:     Cervical: No cervical adenopathy.  Skin:    General: Skin is warm and dry.     Capillary Refill: Capillary refill takes less than 2 seconds.     Findings: No rash.  Neurological:     Mental Status: He is alert and oriented to person, place, and time.     Cranial Nerves: No cranial nerve deficit.  Psychiatric:        Mood and Affect: Mood normal.  Behavior: Behavior normal.      Results for orders placed or performed in visit on 06/28/21  POCT glycosylated hemoglobin (Hb A1C)  Result Value Ref Range   Hemoglobin A1C 6.4 (A) 4.0 - 5.6 %   Est. average glucose Bld gHb Est-mCnc 137     Assessment & Plan     Problem List Items Addressed This Visit       Cardiovascular and Mediastinum   Hypertension associated with diabetes ([place]) - Primary    Uncontrolled here and at home Continue losartan and metoprolol at current dose Add HCTZ 12.26m daily Check BMP F/u in 34m    Relevant Medications   hydrochlorothiazide (HYDRODIURIL) 12.5 MG tablet   Other Relevant Orders   Basic Metabolic Panel (BMET)     Endocrine   Diabetes mellitus, type 2 (HCC)    Chronic and well controlled Associated with HTN and HLD Control is improving today with no more hypoglycemic episodes Continue current medications Up-to-date on screenings and vaccinations      Relevant Orders   POCT glycosylated hemoglobin (Hb A1C) (Completed)   Hyperlipidemia associated with type 2 diabetes mellitus (HCC)    Chronic and stable Continue simvastatin Recheck FLP and CMP in 12/2021      Relevant Medications   hydrochlorothiazide (HYDRODIURIL) 12.5 MG  tablet     Other   Obesity    Discussed importance of healthy weight management Discussed diet and exercise         Return in about 3 months (around 09/28/2021) for chronic disease f/u.      I, AnLavon PaganiniMD, have reviewed all documentation for this visit. The documentation on 06/28/21 for the exam, diagnosis, procedures, and orders are all accurate and complete.   Teddi Badalamenti, AnDionne BucyMD, MPH [facility]

## 2021-06-29 LAB — BASIC METABOLIC PANEL
BUN/Creatinine Ratio: 15 (ref 10–24)
BUN: 15 mg/dL (ref 8–27)
CO2: 26 mmol/L (ref 20–29)
Calcium: 9.7 mg/dL (ref 8.6–10.2)
Chloride: 104 mmol/L (ref 96–106)
Creatinine, Ser: 1.01 mg/dL (ref 0.76–1.27)
Glucose: 105 mg/dL — ABNORMAL HIGH (ref 70–99)
Potassium: 3.8 mmol/L (ref 3.5–5.2)
Sodium: 141 mmol/L (ref 134–144)
eGFR: 79 mL/min/{1.73_m2} (ref >59)

## 2021-07-14 DIAGNOSIS — L570 Actinic keratosis: Secondary | ICD-10-CM | POA: Diagnosis not present

## 2021-07-25 ENCOUNTER — Telehealth: Payer: Self-pay

## 2021-07-25 NOTE — Progress Notes (Signed)
APPOINTMENT REMINDER   Called Soyla Dryer, No answer, left message of appointment on 07/26/2021 at 8:30 am via telephone visit with [name] , Pharm D. Notified to have all medications, supplements, blood pressure and/or blood sugar logs available during appointment and to return call if need to reschedule.   [place] Pharmacist Assistant [phone]

## 2021-07-26 ENCOUNTER — Ambulatory Visit (INDEPENDENT_AMBULATORY_CARE_PROVIDER_SITE_OTHER): Payer: PPO

## 2021-07-26 ENCOUNTER — Telehealth: Payer: PPO

## 2021-07-26 DIAGNOSIS — E1169 Type 2 diabetes mellitus with other specified complication: Secondary | ICD-10-CM

## 2021-07-26 DIAGNOSIS — E1159 Type 2 diabetes mellitus with other circulatory complications: Secondary | ICD-10-CM

## 2021-07-26 MED ORDER — CHLORTHALIDONE 25 MG PO TABS: 25.0000 mg | ORAL_TABLET | Freq: Every day | ORAL | 3 refills | Status: DC

## 2021-07-26 NOTE — Progress Notes (Signed)
Chronic Care Management Pharmacy Note  08/11/2021 Name:  Ivan Kennedy MRN:  [id number] DOB:  09/18/1945  Summary: Patient presents for CCM follow-up. Home blood pressures remain elevated.   Recommendations/Changes made from today's visit: STOP HCTZ (Ineffective)  Start Chlorthalidone 25 mg daily   Plan: CPP follow-up one month.   Subjective: Ivan Kennedy is an 75 y.o. year old male who is a primary patient of Bacigalupo, Dionne Bucy, MD.  The CCM team was consulted for assistance with disease management and care coordination needs.    Engaged with patient by telephone for follow up visit in response to provider referral for pharmacy case management and/or care coordination services.   Consent to Services:  The patient was given information about Chronic Care Management services, agreed to services, and gave verbal consent prior to initiation of services.  Please see initial visit note for detailed documentation.   Patient Care Team: Virginia Crews, MD as PCP - General (Family Medicine) Arelia Sneddon, [place] as Consulting Physician (Optometry) Ree Edman, MD as Consulting Physician (Dermatology) Jonathon Bellows, MD as Consulting Physician (Gastroenterology) Germaine Pomfret, [facility] (Pharmacist)  Recent office visits: 03/24/21: Patient presented to Dr. Brita Romp for follow-up. A1c decreased to 5.9%. Glimepiride decreased from BID to daily.   03/03/21: Patient presented to Dr. Brita Romp for follow-up. HCTZ stopped.  Recent consult visits: None noted.  Hospital visits: None in previous 6 months  Objective:  Lab Results  Component Value Date   CREATININE 1.01 06/28/2021   BUN 15 06/28/2021   GFRNONAA 73 11/06/2019   GFRAA 84 11/06/2019   NA 141 06/28/2021   K 3.8 06/28/2021   CALCIUM 9.7 06/28/2021   CO2 26 06/28/2021   GLUCOSE 105 (H) 06/28/2021    Lab Results  Component Value Date/Time   HGBA1C 6.4 (A) 06/28/2021 08:22 AM   HGBA1C 5.9 (A)  03/24/2021 08:22 AM   HGBA1C 8.0 (H) 12/23/2020 09:32 AM   HGBA1C 7.2 (H) 11/06/2019 09:32 AM   MICROALBUR 20 04/24/2018 08:29 AM   MICROALBUR 50 11/02/2015 11:23 AM    Last diabetic Eye exam:  Lab Results  Component Value Date/Time   HMDIABEYEEXA No Retinopathy 02/24/2021 12:00 AM    Last diabetic Foot exam: No results found for: HMDIABFOOTEX   Lab Results  Component Value Date   CHOL 132 12/23/2020   HDL 45 12/23/2020   LDLCALC 65 12/23/2020   TRIG 126 12/23/2020   CHOLHDL 2.9 12/23/2020    Hepatic Function Latest Ref Rng & Units 12/23/2020 11/06/2019 10/30/2018  Total Protein 6.0 - 8.5 g/dL 6.4 6.5 5.9(L)  Albumin 3.7 - 4.7 g/dL 4.4 4.4 4.3  AST 0 - 40 IU/L _0 ALT 0 - 44 IU/L 25 38 33  Alk Phosphatase 44 - 121 IU/L 53 58 57  Total Bilirubin 0.0 - 1.2 mg/dL 0.7 0.9 0.9    Lab Results  Component Value Date/Time   TSH 1.650 10/30/2018 10:17 AM   TSH 1.460 10/30/2017 08:56 AM    CBC Latest Ref Rng & Units 11/06/2019 10/30/2018 10/30/2017  WBC 3.4 - 10.8 x10E3/uL 6.5 6.1 5.4  Hemoglobin 13.0 - 17.7 g/dL 13.5 13.8 14.2  Hematocrit 37.5 - 51.0 % 38.2 39.7 41.6  Platelets 150 - 450 x10E3/uL 172 150 145(L)    No results found for: VD25OH  Clinical ASCVD: No  The 10-year ASCVD risk score (Arnett DK, et al., 2019) is: 45.5%   Values used to calculate the score:     Age:  36 years     Sex: Male     Is Non-Hispanic African American: No     Diabetic: Yes     Tobacco smoker: No     Systolic Blood Pressure: 919 mmHg     Is BP treated: Yes     HDL Cholesterol: 45 mg/dL     Total Cholesterol: 132 mg/dL    Depression screen [facility] 2/9 06/28/2021 03/24/2021 03/03/2021  Decreased Interest 0 0 0  Down, Depressed, Hopeless 0 0 0  PHQ - 2 Score 0 0 0  Altered sleeping 0 0 0  Tired, decreased energy 0 1 1  Change in appetite 1 0 0  Feeling bad or failure about yourself  0 0 0  Trouble concentrating 0 0 0  Moving slowly or fidgety/restless 0 0 0  Suicidal thoughts 0 0 0   PHQ-9 Score _0 Difficult doing work/chores Not difficult at all Not difficult at all Not difficult at all    Social History   Tobacco Use  Smoking Status Never  Smokeless Tobacco Never   BP Readings from Last 3 Encounters:  06/28/21 (!) 144/86  06/08/21 (!) 146/95  05/25/21 (!) 140/97   Pulse Readings from Last 3 Encounters:  06/28/21 72  06/08/21 68  05/25/21 66   Wt Readings from Last 3 Encounters:  06/28/21 243 lb (110.2 kg)  06/08/21 239 lb (108.4 kg)  05/25/21 243 lb 4.8 oz (110.4 kg)   BMI Readings from Last 3 Encounters:  06/28/21 32.06 kg/m  06/08/21 31.53 kg/m  05/25/21 32.10 kg/m    Assessment/Interventions: Review of patient past medical history, allergies, medications, health status, including review of consultants reports, laboratory and other test data, was performed as part of comprehensive evaluation and provision of chronic care management services.   SDOH:  (Social Determinants of Health) assessments and interventions performed: Yes SDOH Interventions    Flowsheet Row Most Recent Value  SDOH Interventions   Financial Strain Interventions Intervention Not Indicated       SDOH Screenings   Alcohol Screen: Low Risk    Last Alcohol Screening Score (AUDIT): 2  Depression (PHQ2-9): Low Risk    PHQ-2 Score: 1  Financial Resource Strain: Low Risk    Difficulty of Paying Living Expenses: Not hard at all  Food Insecurity: Not on file  Housing: Not on file  Physical Activity: Not on file  Social Connections: Not on file  Stress: Not on file  Tobacco Use: Low Risk    Smoking Tobacco Use: Never   Smokeless Tobacco Use: Never   Passive Exposure: Not on file  Transportation Needs: Not on file    [place]  No Known Allergies  Medications Reviewed Today     Reviewed by Virginia Crews, MD (Physician) on 06/28/21 at [place] List Status: <None>   Medication Order Taking? Sig Documenting Provider Last Dose Status Informant   aspirin EC 81 MG tablet [id number] Yes Take 81 mg by mouth daily.  [provider] Taking Active            Med Note Wilson Singer   Tue Nov 09, 2015 10:46 AM) Received from: [facility]  Blood Glucose Monitoring Suppl (ONE TOUCH ULTRA 2) w/Device Drucie Opitz [id number] Yes To check blood sugar once daily Mar Daring, [place] Taking Active   doxycycline (VIBRAMYCIN) 50 MG capsule [id number] Yes Take 50 mg by mouth 2 (two) times daily. [provider] Taking Active  Med Note Michaelle Birks, [place]   Tue Jan 04, 2021  3:18 PM) Prescribed by Dr. Aubery Lapping  glimepiride (AMARYL) 4 MG tablet [id number] Yes Take 1 tablet (4 mg total) by mouth daily with breakfast. Virginia Crews, MD Taking Active   hydrochlorothiazide (HYDRODIURIL) 12.5 MG tablet [id number] Yes Take 1 tablet (12.5 mg total) by mouth daily. Virginia Crews, MD  Active   Lancets [facility] SOFT [facility]) lancets [id number] Yes To check blood glucose daily Mar Daring, [place] Taking Active   loratadine (CLARITIN) 10 MG tablet [id number] Yes Take 10 mg by mouth daily. [provider] Taking Active   losartan (COZAAR) 100 MG tablet [id number] Yes TAKE 1 TABLET BY MOUTH EVERY DAY Bacigalupo, Dionne Bucy, MD Taking Active   metoprolol succinate (TOPROL-XL) 25 MG 24 hr tablet [id number] Yes TAKE 1 TABLET (25 MG TOTAL) BY MOUTH DAILY. Virginia Crews, MD Taking Active   metroNIDAZOLE (METROGEL) 1 % gel [id number] Yes Apply 1 application topically every morning. [provider] Taking Active            Med Note Wilmon Arms Apr 21, 2015  2:29 PM) Received from: External Pharmacy  omeprazole (PRILOSEC) 40 MG capsule [id number] Yes TAKE 1 CAPSULE BY MOUTH EVERY DAY Brita Romp Dionne Bucy, MD Taking Active   [facility] ULTRA test strip [id number] Yes CHECK BLOOD SUGAR ONCE DAILY Mar Daring, [place] Taking Active   pioglitazone (ACTOS) 45 MG tablet [id number] Yes  TAKE 1 TABLET BY MOUTH EVERY DAY Bacigalupo, Dionne Bucy, MD Taking Active   Semaglutide (RYBELSUS) 14 MG TABS [id number] Yes TAKE 14 MG (1 TABLET) BY MOUTH DAILY BEFORE BREAKFAST. TAKE ON AN EMPTY STOMACH WITH A SIP OF WATER Virginia Crews, MD Taking Active   simvastatin (ZOCOR) 20 MG tablet [id number] Yes TAKE 1 TABLET BY MOUTH EVERY DAY IN THE EVENING Bacigalupo, Dionne Bucy, MD Taking Active   tretinoin (RETIN-A) 0.05 % cream [id number] Yes Apply 1 application topically at bedtime. Mar Daring, PA-C Taking Active   vitamin B-12 (CYANOCOBALAMIN) 1000 MCG tablet [id number] Yes Take 1,000 mcg by mouth daily. [provider] Taking Active             Patient Active Problem List   Diagnosis Date Noted   Obesity 03/03/2021   Benign paroxysmal positional vertigo 12/23/2020   Hallux valgus 07/31/2018   IBS (irritable bowel syndrome) 01/21/2015   B12 deficiency 01/07/2015   Hyperlipidemia associated with type 2 diabetes mellitus ([place]) 01/07/2015   Cannot sleep 01/07/2015   Fatty liver disease, nonalcoholic 67/54/4920   Osteoarthritis of knee 01/07/2015   Acne erythematosa 01/07/2015   Disorder of male genital organs 01/07/2015   Atypical chest pain 10/09/2014   Acid reflux 09/06/2009   Diabetes mellitus, type 2 ([place]) 11/20/2008   Colon, diverticulosis 11/20/2008   Hypertension associated with diabetes ([place]) 11/20/2008   Apnea, sleep 11/20/2008   History of artificial joint 11/20/2008    Immunization History  Administered Date(s) Administered   Fluad Quad(high Dose 65+) 05/08/2019, 06/23/2020, 06/28/2021   Hepatitis B, adult 05/29/2012, 07/29/2012, 03/18/2013   Influenza, High Dose Seasonal PF 07/06/2014, 05/06/2015, 07/11/2016, 04/26/2017, 04/24/2018   PFIZER(Purple Top)SARS-COV-2 Vaccination 09/29/2019, 10/28/2019   Pneumococcal Conjugate-13 07/06/2014   Pneumococcal Polysaccharide-23 05/29/2012, 10/30/2018    Conditions to be addressed/monitored:  Hypertension,  Hyperlipidemia, Diabetes, GERD, Osteoarthritis and IBS  Care Plan : General Pharmacy (Adult)  Updates made by Germaine Pomfret, [place] since 08/11/2021 12:00 AM  Problem: Hypertension, Hyperlipidemia, Diabetes, GERD, Osteoarthritis and IBS   Priority: High     Long-Range Goal: Patient-Specific Goal   Start Date: 01/04/2021  Expected End Date: 04/26/2022  This Visit's Progress: On track  Recent Progress: On track  Priority: High  Note:   Current Barriers:  Unable to achieve control of diabetes   Pharmacist Clinical Goal(s):  Patient will achieve control of diabetes as evidenced by A1c less than 8% through collaboration with PharmD and provider.   Interventions: 1:1 collaboration with Virginia Crews, MD regarding development and update of comprehensive plan of care as evidenced by provider attestation and co-signature Inter-disciplinary care team collaboration (see longitudinal plan of care) Comprehensive medication review performed; medication list updated in electronic medical record  Hypertension (BP goal <140/90) -Not ideally Controlled -Current treatment: HCTZ 12.5 mg daily  Losartan 100 mg daily  Metoprolol XL 25 mg daily (history of skipped beats)  -Medications previously tried: HCTZ (Hypotension)   -Current home readings:  125/72; pulse 100 ("felt draggy")  153/97 150/94 151/86 158/82 138/84 162/107  -Denies hypotension symptoms..  -Stop HCTZ -START Chlorthalidone 25 mg daily   Hyperlipidemia: (LDL goal < 70) -Controlled -Current treatment: Simvastatin 20 mg nightly  -Current antiplatelet treatment: Aspirin 81 mg daily  -Medications previously tried: NA  -Educated on Importance of limiting foods high in cholesterol; -Recommended to continue current medication  Diabetes (A1c goal <8%) -Controlled -Current medications: Glimepiride 4 mg daily   Pioglitazone 45 mg daily  Rybelsus 14 mg daily  -Medications previously tried: Metformin IR + ER  (GI), Januvia (DDI with GLP-1), Invokana (dizziness), Trulicity, glipizide,    -Current home glucose readings 9-Dec 129  8-Dec 158  7-Dec 160  6-Dec 136  5-Dec 152  4-Dec 173  3-Dec 144  2-Dec 112  1-Dec 143  30-Nov 143  29-Nov 177  28-Nov 139  Average 147  -Denies hypoglycemic/hyperglycemic symptoms -Current meal patterns: Avoids sweet drinks, desserts, breakfast: Ham biscuit + water lunch: Hot dog + Pack chips + water   dinner: Country style steak + coleslaw + rice + green beans + biscuit + water Snacks: strawberries  drinks: Primarily water. 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  -Continue current medications  GERD (Goal: Prevent heartburn/reflux symptoms) -Controlled -Current treatment  Omeprazole 40 mg daily  -Medications previously tried: NA  -Recommended to continue current medication  Allergic Rhinitis (Goal: Maintain symptom control) -Controlled -Current treatment   Claritin 1 tablet daily  -Medications previously tried: NA -Counseled on risks of increased blood pressure with pseudoephedrine.  -Recommended switching to claritin daily   Patient Goals/Self-Care Activities Patient will:  - check glucose daily, document, and provide at future appointments check blood pressure weekly, document, and provide at future appointments  Follow Up Plan: Telephone follow up appointment with care management team member scheduled for:  08/19/2021 at 10:15 AM       Medication Assistance: None required.  Patient affirms current coverage meets needs.  Patient's [facility] pharmacy is:  [id number] [address]Phone: [phone]: [phone]Phone: [phone]: [phone] [id number] [place] [place] [place]one: [phone]: [phone] Uses pill box? Yes  Pt endorses 100% compliance  We discussed: Current pharmacy is preferred with insurance plan and patient is satisfied with pharmacy services Patient decided to: Continue current medication management strategy  Care Plan and Follow Up Patient Decision:  Patient agrees to Care Plan and Follow-up.  Plan: Telephone follow up appointment with care management team member scheduled for:  08/19/2021 at 10:15 AM  [name], PharmD, [place] [phone]

## 2021-07-26 NOTE — Progress Notes (Deleted)
Chronic Care Management Pharmacy Note  07/26/2021 Name:  Ivan Kennedy MRN:  [id number] DOB:  08/03/1946  Summary: Patient presents for CCM follow-up. Blood sugars improved with decreased dose of glimepiride. Blood pressures have been elevated since stopping HCTZ, but patient did not have home readings with him at this time.   Recommendations/Changes made from today's visit: Continue current medications Increase blood pressure monitoring to twice weekly for one month   Plan: HC to assess BP readings in one month  CPP follow-up in 3 months  Subjective: Ivan Kennedy is an 75 y.o. year old male who is a primary patient of Bacigalupo, Dionne Bucy, MD.  The CCM team was consulted for assistance with disease management and care coordination needs.    Engaged with patient by telephone for follow up visit in response to provider referral for pharmacy case management and/or care coordination services.   Consent to Services:  The patient was given information about Chronic Care Management services, agreed to services, and gave verbal consent prior to initiation of services.  Please see initial visit note for detailed documentation.   Patient Care Team: Virginia Crews, MD as PCP - General (Family Medicine) Arelia Sneddon, [place] as Consulting Physician (Optometry) Ree Edman, MD as Consulting Physician (Dermatology) Jonathon Bellows, MD as Consulting Physician (Gastroenterology) Germaine Pomfret, [facility] (Pharmacist)  Recent office visits: 03/24/21: Patient presented to Dr. Brita Romp for follow-up. A1c decreased to 5.9%. Glimepiride decreased from BID to daily.   03/03/21: Patient presented to Dr. Brita Romp for follow-up. HCTZ stopped. 12/30/2020 Dr.Bacigalupo MD (PCP)-Stop Metformin XR 500 mg 12/23/2020 Dr.Bacigalupo MD (PCP)- Stop Januvia 100 mg, start Metformin XR 500 mg 1 tablet  daily with breakfast, start Meclizine 25 mg PRN 09/24/2020 [name] PA-C (PCP)-  Start  Rybelsus Take 7 mg by mouth daily,Completed Course of Potassium Chloride. 07/20/2020 [name] PA-C (PCP)- Updated directions sent in for Glimepiride for  4 mg 1 tablet twice a day. 07/16/2020 [name] PA-C (PCP) Referral placed for Dr.Howard Sabra Heck at [facility].  Recent consult visits: 11/04/2020 Dr. Aubery Lapping MD (Dermatology) -No medication Change Noted 08/16/2020 Rivka Barbara Physical Therapist (Physical Therapy)- No medication change noted 08/16/2020 Darcey Nora Physical Therapist (Physical Therapy)- No medication change noted. 07/27/2020 Philis Nettle MD (Dermatology) - No medication Change Noted 07/26/2020 Carlynn Spry PA [facility]) -No medication change noted. 07/26/2020 Dr. Sabra Heck MD  (Ortopedic Surgery) - No medication changes noted.  Hospital visits: None in previous 6 months  Objective:  Lab Results  Component Value Date   CREATININE 1.01 06/28/2021   BUN 15 06/28/2021   GFRNONAA 73 11/06/2019   GFRAA 84 11/06/2019   NA 141 06/28/2021   K 3.8 06/28/2021   CALCIUM 9.7 06/28/2021   CO2 26 06/28/2021   GLUCOSE 105 (H) 06/28/2021    Lab Results  Component Value Date/Time   HGBA1C 6.4 (A) 06/28/2021 08:22 AM   HGBA1C 5.9 (A) 03/24/2021 08:22 AM   HGBA1C 8.0 (H) 12/23/2020 09:32 AM   HGBA1C 7.2 (H) 11/06/2019 09:32 AM   MICROALBUR 20 04/24/2018 08:29 AM   MICROALBUR 50 11/02/2015 11:23 AM    Last diabetic Eye exam:  Lab Results  Component Value Date/Time   HMDIABEYEEXA No Retinopathy 02/24/2021 12:00 AM    Last diabetic Foot exam: No results found for: HMDIABFOOTEX   Lab Results  Component Value Date   CHOL 132 12/23/2020   HDL 45 12/23/2020   LDLCALC 65 12/23/2020   TRIG 126 12/23/2020   CHOLHDL 2.9 12/23/2020  Hepatic Function Latest Ref Rng & Units 12/23/2020 11/06/2019 10/30/2018  Total Protein 6.0 - 8.5 g/dL 6.4 6.5 5.9(L)  Albumin 3.7 - 4.7 g/dL 4.4 4.4 4.3  AST 0 - 40 IU/L _0 ALT 0 - 44  IU/L 25 38 33  Alk Phosphatase 44 - 121 IU/L 53 58 57  Total Bilirubin 0.0 - 1.2 mg/dL 0.7 0.9 0.9    Lab Results  Component Value Date/Time   TSH 1.650 10/30/2018 10:17 AM   TSH 1.460 10/30/2017 08:56 AM    CBC Latest Ref Rng & Units 11/06/2019 10/30/2018 10/30/2017  WBC 3.4 - 10.8 x10E3/uL 6.5 6.1 5.4  Hemoglobin 13.0 - 17.7 g/dL 13.5 13.8 14.2  Hematocrit 37.5 - 51.0 % 38.2 39.7 41.6  Platelets 150 - 450 x10E3/uL 172 150 145(L)    No results found for: VD25OH  Clinical ASCVD: No  The 10-year ASCVD risk score (Arnett DK, et al., 2019) is: 45.5%   Values used to calculate the score:     Age: 55 years     Sex: Male     Is Non-Hispanic African American: No     Diabetic: Yes     Tobacco smoker: No     Systolic Blood Pressure: 169 mmHg     Is BP treated: Yes     HDL Cholesterol: 45 mg/dL     Total Cholesterol: 132 mg/dL    Depression screen [facility] 2/9 06/28/2021 03/24/2021 03/03/2021  Decreased Interest 0 0 0  Down, Depressed, Hopeless 0 0 0  PHQ - 2 Score 0 0 0  Altered sleeping 0 0 0  Tired, decreased energy 0 1 1  Change in appetite 1 0 0  Feeling bad or failure about yourself  0 0 0  Trouble concentrating 0 0 0  Moving slowly or fidgety/restless 0 0 0  Suicidal thoughts 0 0 0  PHQ-9 Score _1 Difficult doing work/chores Not difficult at all Not difficult at all Not difficult at all    Social History   Tobacco Use  Smoking Status Never  Smokeless Tobacco Never   BP Readings from Last 3 Encounters:  06/28/21 (!) 144/86  06/08/21 (!) 146/95  05/25/21 (!) 140/97   Pulse Readings from Last 3 Encounters:  06/28/21 72  06/08/21 68  05/25/21 66   Wt Readings from Last 3 Encounters:  06/28/21 243 lb (110.2 kg)  06/08/21 239 lb (108.4 kg)  05/25/21 243 lb 4.8 oz (110.4 kg)   BMI Readings from Last 3 Encounters:  06/28/21 32.06 kg/m  06/08/21 31.53 kg/m  05/25/21 32.10 kg/m    Assessment/Interventions: Review of patient past medical history, allergies,  medications, health status, including review of consultants reports, laboratory and other test data, was performed as part of comprehensive evaluation and provision of chronic care management services.   SDOH:  (Social Determinants of Health) assessments and interventions performed: Yes   SDOH Screenings   Alcohol Screen: Low Risk    Last Alcohol Screening Score (AUDIT): 2  Depression (PHQ2-9): Low Risk    PHQ-2 Score: 1  Financial Resource Strain: Low Risk    Difficulty of Paying Living Expenses: Not hard at all  Food Insecurity: Not on file  Housing: Not on file  Physical Activity: Not on file  Social Connections: Not on file  Stress: Not on file  Tobacco Use: Low Risk    Smoking Tobacco Use: Never   Smokeless Tobacco Use: Never   Passive Exposure: Not on file  Transportation Needs: Not on file    [place]  No Known Allergies  Medications Reviewed Today     Reviewed by Virginia Crews, MD (Physician) on 06/28/21 at [place] List Status: <None>   Medication Order Taking? Sig Documenting Provider Last Dose Status Informant  aspirin EC 81 MG tablet [id number] Yes Take 81 mg by mouth daily.  [provider] Taking Active            Med Note Wilson Singer   Tue Nov 09, 2015 10:46 AM) Received from: [facility]  Blood Glucose Monitoring Suppl (ONE TOUCH ULTRA 2) w/Device Drucie Opitz [id number] Yes To check blood sugar once daily Mar Daring, [place] Taking Active   doxycycline (VIBRAMYCIN) 50 MG capsule [id number] Yes Take 50 mg by mouth 2 (two) times daily. [provider] Taking Active            Med Note Michaelle Birks, [place]   Tue Jan 04, 2021  3:18 PM) Prescribed by Dr. Aubery Lapping  glimepiride (AMARYL) 4 MG tablet [id number] Yes Take 1 tablet (4 mg total) by mouth daily with breakfast. Virginia Crews, MD Taking Active   hydrochlorothiazide (HYDRODIURIL) 12.5 MG tablet [id number] Yes Take 1 tablet (12.5 mg total) by mouth  daily. Virginia Crews, MD  Active   Lancets [facility] SOFT [facility]) lancets [id number] Yes To check blood glucose daily Mar Daring, [place] Taking Active   loratadine (CLARITIN) 10 MG tablet [id number] Yes Take 10 mg by mouth daily. [provider] Taking Active   losartan (COZAAR) 100 MG tablet [id number] Yes TAKE 1 TABLET BY MOUTH EVERY DAY Bacigalupo, Dionne Bucy, MD Taking Active   metoprolol succinate (TOPROL-XL) 25 MG 24 hr tablet [id number] Yes TAKE 1 TABLET (25 MG TOTAL) BY MOUTH DAILY. Virginia Crews, MD Taking Active   metroNIDAZOLE (METROGEL) 1 % gel [id number] Yes Apply 1 application topically every morning. [provider] Taking Active            Med Note Wilmon Arms Apr 21, 2015  2:29 PM) Received from: External Pharmacy  omeprazole (PRILOSEC) 40 MG capsule [id number] Yes TAKE 1 CAPSULE BY MOUTH EVERY DAY Brita Romp Dionne Bucy, MD Taking Active   [facility] ULTRA test strip [id number] Yes CHECK BLOOD SUGAR ONCE DAILY Mar Daring, [place] Taking Active   pioglitazone (ACTOS) 45 MG tablet [id number] Yes TAKE 1 TABLET BY MOUTH EVERY DAY Bacigalupo, Dionne Bucy, MD Taking Active   Semaglutide (RYBELSUS) 14 MG TABS [id number] Yes TAKE 14 MG (1 TABLET) BY MOUTH DAILY BEFORE BREAKFAST. TAKE ON AN EMPTY STOMACH WITH A SIP OF WATER Virginia Crews, MD Taking Active   simvastatin (ZOCOR) 20 MG tablet [id number] Yes TAKE 1 TABLET BY MOUTH EVERY DAY IN THE EVENING Bacigalupo, Dionne Bucy, MD Taking Active   tretinoin (RETIN-A) 0.05 % cream [id number] Yes Apply 1 application topically at bedtime. Mar Daring, PA-C Taking Active   vitamin B-12 (CYANOCOBALAMIN) 1000 MCG tablet [id number] Yes Take 1,000 mcg by mouth daily. [provider] Taking Active             Patient Active Problem List   Diagnosis Date Noted   Obesity 03/03/2021   Benign paroxysmal positional vertigo 12/23/2020   Hallux valgus 07/31/2018   IBS (irritable bowel  syndrome) 01/21/2015   B12 deficiency 01/07/2015   Hyperlipidemia associated with type 2 diabetes mellitus ([place]) 01/07/2015   Cannot sleep 01/07/2015   Fatty liver  disease, nonalcoholic 18/84/1660   Osteoarthritis of knee 01/07/2015   Acne erythematosa 01/07/2015   Disorder of male genital organs 01/07/2015   Atypical chest pain 10/09/2014   Acid reflux 09/06/2009   Diabetes mellitus, type 2 ([place]) 11/20/2008   Colon, diverticulosis 11/20/2008   Hypertension associated with diabetes ([place]) 11/20/2008   Apnea, sleep 11/20/2008   History of artificial joint 11/20/2008    Immunization History  Administered Date(s) Administered   Fluad Quad(high Dose 65+) 05/08/2019, 06/23/2020, 06/28/2021   Hepatitis B, adult 05/29/2012, 07/29/2012, 03/18/2013   Influenza, High Dose Seasonal PF 07/06/2014, 05/06/2015, 07/11/2016, 04/26/2017, 04/24/2018   PFIZER(Purple Top)SARS-COV-2 Vaccination 09/29/2019, 10/28/2019   Pneumococcal Conjugate-13 07/06/2014   Pneumococcal Polysaccharide-23 05/29/2012, 10/30/2018    Conditions to be addressed/monitored:  Hypertension, Hyperlipidemia, Diabetes, GERD, Osteoarthritis and IBS  There are no care plans that you recently modified to display for this patient.    Medication Assistance: None required.  Patient affirms current coverage meets needs.  Patient's preferred pharmacy [facility]:  [id number] [place] [address]Phone: [phone]: [phone] [place]Phone: [phone]: [phone] [id number] [place] [place]: [phone]: [phone] pill box? Yes Pt endorses 100% compliance  We discussed: Current pharmacy is preferred with insurance plan and patient is satisfied with pharmacy  services Patient decided to: Continue current medication management strategy  Care Plan and Follow Up Patient Decision:  Patient agrees to Care Plan and Follow-up.  Plan: Telephone follow up appointment with care management team member scheduled for:  07/26/2021 at 8:30 AM  AlJunius ArgylePharmD, CPSinaiamily Practice [phone] Barriers:  Unable to achieve control of diabetes   Pharmacist Clinical Goal(s):  Patient will achieve control of diabetes as evidenced by A1c less than 8% through collaboration with PharmD and provider.   Interventions: 1:1 collaboration with BaVirginia CrewsMD regarding development and update of comprehensive plan of care as evidenced by provider attestation and co-signature Inter-disciplinary care team collaboration (see longitudinal plan of care) Comprehensive medication review performed; medication list updated in electronic medical record  Hypertension (BP goal <140/90) -Not ideally Controlled -Current treatment: Losartan 100 mg daily  Metoprolol XL 25 mg daily (history of skipped beats)  -Medications previously tried: HCTZ (Hypotension)   -Current home readings: Did not have BP readings with him  -Denies hypotension symptoms. Improved with discontinuation of HCTZ.  -Educated on Symptoms of hypotension and importance of maintaining adequate hydration; -Counseled to monitor BP at home twice weekly, document, and provide log at future appointments -Recommended to continue current medication  Hyperlipidemia: (LDL goal < 70) -Controlled -Current treatment: Simvastatin 20 mg nightly  -Current antiplatelet treatment: Aspirin 81 mg daily  -Medications previously tried: NA  -Educated on Importance of limiting foods high in cholesterol; -Recommended to continue current medication  Diabetes (A1c goal <8%) -Controlled -Current medications: Glimepiride 4 mg daily   Pioglitazone 45 mg daily  Rybelsus 14 mg  daily  -Medications previously tried: Metformin IR + ER (GI), Januvia (DDI with GLP-1), Invokana (dizziness), Trulicity, glipizide,    -Current home glucose readings  Fasting  13-Sep 118  12-Sep 107  11-Sep 125  10-Sep 128  9-Sep 117  8-Sep 138  7-Sep 124  6-Sep 114  5-Sep 119  4-Sep 119  3-Sep 123  2-Sep 122  1-Sep 102  31-Aug 137  Average 121  -Denies hypoglycemic/hyperglycemic symptoms -Current meal patterns: Avoids sweet drinks, desserts, breakfast: Ham biscuit + water lunch: Hot dog + Pack chips + water   dinner: Country style steak + coleslaw + rice + green beans + biscuit + water Snacks: strawberries  drinks: Primarily water. Zero Pepsi every other day,  -Current exercise: Active with farm work.  -Continue current medications  GERD (Goal: Prevent heartburn/reflux symptoms) -Controlled -Current treatment  Omeprazole 40 mg daily  -Medications previously tried: NA  -Recommended to continue current medication  Allergic Rhinitis (Goal: Maintain symptom control) -Controlled -Current treatment   Claritin 1 tablet daily  -Medications previously tried: NA -Counseled on risks of increased blood pressure with pseudoephedrine.  -Recommended switching to claritin daily   Patient Goals/Self-Care Activities Patient will:  - check glucose daily, document, and provide at future appointments check blood pressure weekly, document, and provide at future appointments  Follow Up Plan: Telephone follow up appointment with care management team member scheduled for:  07/26/2021 at 8:30 AM

## 2021-08-11 NOTE — Patient Instructions (Signed)
Visit Information It was great speaking with you today!  Please let me know if you have any questions about our visit.   Goals Addressed             This Visit's Progress    Monitor and Manage My Blood Sugar-Diabetes Type 2   On track    Timeframe:  Long-Range Goal Priority:  High Start Date:  01/04/2021                            Expected End Date: 07/07/2022                      Follow Up within 90 days   - check blood sugar at prescribed times - check blood sugar if I feel it is too high or too low - enter blood sugar readings and medication or insulin into daily log    Why is this important?   Checking your blood sugar at home helps to keep it from getting very high or very low.  Writing the results in a diary or log helps the doctor know how to care for you.  Your blood sugar log should have the time, date and the results.  Also, write down the amount of insulin or other medicine that you take.  Other information, like what you ate, exercise done and how you were feeling, will also be helpful.     Notes:         Patient Care Plan: General Pharmacy (Adult)     Problem Identified: Hypertension, Hyperlipidemia, Diabetes, GERD, Osteoarthritis and IBS   Priority: High     Long-Range Goal: Patient-Specific Goal   Start Date: 01/04/2021  Expected End Date: 04/26/2022  This Visit's Progress: On track  Recent Progress: On track  Priority: High  Note:   Current Barriers:  Unable to achieve control of diabetes   Pharmacist Clinical Goal(s):  Patient will achieve control of diabetes as evidenced by A1c less than 8% through collaboration with PharmD and provider.   Interventions: 1:1 collaboration with Virginia Crews, MD regarding development and update of comprehensive plan of care as evidenced by provider attestation and co-signature Inter-disciplinary care team collaboration (see longitudinal plan of care) Comprehensive medication review performed; medication  list updated in electronic medical record  Hypertension (BP goal <140/90) -Not ideally Controlled -Current treatment: HCTZ 12.5 mg daily  Losartan 100 mg daily  Metoprolol XL 25 mg daily (history of skipped beats)  -Medications previously tried: HCTZ (Hypotension)   -Current home readings:  125/72; pulse 100 ("felt draggy")  153/97 150/94 151/86 158/82 138/84 162/107  -Denies hypotension symptoms..  -Stop HCTZ -START Chlorthalidone 25 mg daily   Hyperlipidemia: (LDL goal < 70) -Controlled -Current treatment: Simvastatin 20 mg nightly  -Current antiplatelet treatment: Aspirin 81 mg daily  -Medications previously tried: NA  -Educated on Importance of limiting foods high in cholesterol; -Recommended to continue current medication  Diabetes (A1c goal <8%) -Controlled -Current medications: Glimepiride 4 mg daily   Pioglitazone 45 mg daily  Rybelsus 14 mg daily  -Medications previously tried: Metformin IR + ER (GI), Januvia (DDI with GLP-1), Invokana (dizziness), Trulicity, glipizide,    -Current home glucose readings 9-Dec 129  8-Dec 158  7-Dec 160  6-Dec 136  5-Dec 152  4-Dec 173  3-Dec 144  2-Dec 112  1-Dec 143  30-Nov 143  29-Nov 177  28-Nov 139  Average 147  -Denies hypoglycemic/hyperglycemic symptoms -  Current meal patterns: Avoids sweet drinks, desserts, breakfast: Ham biscuit + water lunch: Hot dog + Pack chips + water   dinner: Country style steak + coleslaw + rice + green beans + biscuit + water Snacks: strawberries  drinks: Primarily water. 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  -Continue current medications  GERD (Goal: Prevent heartburn/reflux symptoms) -Controlled -Current treatment  Omeprazole 40 mg daily  -Medications previously tried: NA  -Recommended to continue current medication  Allergic Rhinitis (Goal: Maintain symptom control) -Controlled -Current treatment   Claritin 1 tablet  daily  -Medications previously tried: NA -Counseled on risks of increased blood pressure with pseudoephedrine.  -Recommended switching to claritin daily   Patient Goals/Self-Care Activities Patient will:  - check glucose daily, document, and provide at future appointments check blood pressure weekly, document, and provide at future appointments  Follow Up Plan: Telephone follow up appointment with care management team member scheduled for:  08/19/2021 at 10:15 AM    Patient agreed to services and verbal consent obtained.   Patient verbalizes understanding of instructions provided today and agrees to view in [place].   [name], PharmD, [place], CPP  Clinical Pharmacist Practitioner  [facility] [phone]

## 2021-08-13 DIAGNOSIS — I152 Hypertension secondary to endocrine disorders: Secondary | ICD-10-CM

## 2021-08-13 DIAGNOSIS — E1159 Type 2 diabetes mellitus with other circulatory complications: Secondary | ICD-10-CM | POA: Diagnosis not present

## 2021-08-13 DIAGNOSIS — E1169 Type 2 diabetes mellitus with other specified complication: Secondary | ICD-10-CM | POA: Diagnosis not present

## 2021-08-18 ENCOUNTER — Telehealth: Payer: Self-pay

## 2021-08-18 NOTE — Progress Notes (Signed)
Chronic Care Management APPOINTMENT REMINDER  Ivan Kennedy was reminded to have all medications, supplements and any blood glucose and blood pressure readings available for review with [name], Pharm. D, at his telephone visit on 08/19/2021 at 10:15 am.  Patient confirm appointment.  [place] Pharmacist Assistant [phone]

## 2021-08-19 ENCOUNTER — Ambulatory Visit (INDEPENDENT_AMBULATORY_CARE_PROVIDER_SITE_OTHER): Payer: PPO

## 2021-08-19 VITALS — BP 105/62

## 2021-08-19 DIAGNOSIS — E1159 Type 2 diabetes mellitus with other circulatory complications: Secondary | ICD-10-CM

## 2021-08-19 DIAGNOSIS — E1169 Type 2 diabetes mellitus with other specified complication: Secondary | ICD-10-CM

## 2021-08-19 MED ORDER — CHLORTHALIDONE 25 MG PO TABS: 12.5000 mg | ORAL_TABLET | Freq: Every day | ORAL | 3 refills | Status: DC

## 2021-08-19 NOTE — Patient Instructions (Signed)
Visit Information It was great speaking with you today!  Please let me know if you have any questions about our visit.   Goals Addressed             This Visit's Progress    Monitor and Manage My Blood Sugar-Diabetes Type 2   On track    Timeframe:  Long-Range Goal Priority:  High Start Date:  01/04/2021                            Expected End Date: 07/07/2022                      Follow Up within 90 days   - check blood sugar at prescribed times - check blood sugar if I feel it is too high or too low - enter blood sugar readings and medication or insulin into daily log    Why is this important?   Checking your blood sugar at home helps to keep it from getting very high or very low.  Writing the results in a diary or log helps the doctor know how to care for you.  Your blood sugar log should have the time, date and the results.  Also, write down the amount of insulin or other medicine that you take.  Other information, like what you ate, exercise done and how you were feeling, will also be helpful.     Notes:         Patient Care Plan: General Pharmacy (Adult)     Problem Identified: Hypertension, Hyperlipidemia, Diabetes, GERD, Osteoarthritis and IBS   Priority: High     Long-Range Goal: Patient-Specific Goal   Start Date: 01/04/2021  Expected End Date: 04/26/2022  This Visit's Progress: On track  Recent Progress: On track  Priority: High  Note:   Current Barriers:  Unable to achieve control of diabetes   Pharmacist Clinical Goal(s):  Patient will achieve control of diabetes as evidenced by A1c less than 8% through collaboration with PharmD and provider.   Interventions: 1:1 collaboration with Virginia Crews, MD regarding development and update of comprehensive plan of care as evidenced by provider attestation and co-signature Inter-disciplinary care team collaboration (see longitudinal plan of care) Comprehensive medication review performed; medication  list updated in electronic medical record  Hypertension (BP goal <140/90) -Not ideally Controlled -Current treatment: Chlorthalidone 25 mg daily: Appropriate, Effective, Query Safe Losartan 100 mg daily: Appropriate, Effective, Query Safe Metoprolol XL 25 mg daily: Appropriate (history of skipped beats), Effective, Query Safe -Medications previously tried: HCTZ (Hypotension)   -Current home readings:  114/70 116/67 125/65 110/61 105/62 -Reports hypotension symptoms: Dizziness, lightheadedness. Symptoms worse with postural changes.  -DECREASE Chlorthalidone to 12.5 mg daily. Will have CMA call patient in 1-2 weeks to assess if hypotension symptoms improve. If not, will plan to change chlorthalidone to amlodipine.   Hyperlipidemia: (LDL goal < 70) -Controlled -Current treatment: Simvastatin 20 mg nightly  -Current antiplatelet treatment: Aspirin 81 mg daily  -Medications previously tried: NA  -Educated on Importance of limiting foods high in cholesterol; -Recommended to continue current medication  Diabetes (A1c goal <8%) -Controlled -Current medications: Glimepiride 4 mg daily   Pioglitazone 45 mg daily  Rybelsus 14 mg daily  -Medications previously tried: Metformin IR + ER (GI), Januvia (DDI with GLP-1), Invokana (dizziness), Trulicity, glipizide,    -Current home glucose readings: 144, 132, 131, 168, 131  -Denies hypoglycemic/hyperglycemic symptoms -Current meal patterns:  Avoids sweet drinks, desserts, breakfast: Ham biscuit + water lunch: Hot dog + Pack chips + water   dinner: Country style steak + coleslaw + rice + green beans + biscuit + water Snacks: strawberries  drinks: Primarily water. Zero Pepsi every other day,  -Current exercise: walking most days  -Continue current medications  GERD (Goal: Prevent heartburn/reflux symptoms) -Controlled -Current treatment  Omeprazole 40 mg daily  -Medications previously tried: NA  -Recommended to continue current  medication  Allergic Rhinitis (Goal: Maintain symptom control) -Controlled -Current treatment   Claritin 1 tablet daily  -Medications previously tried: NA -Counseled on risks of increased blood pressure with pseudoephedrine.  -Recommended switching to claritin daily   Patient Goals/Self-Care Activities Patient will:  - check glucose daily, document, and provide at future appointments check blood pressure weekly, document, and provide at future appointments  Follow Up Plan: Telephone follow up appointment with care management team member scheduled for:  11/14/2021 at 3:00 PM    Patient agreed to services and verbal consent obtained.   Patient verbalizes understanding of instructions provided today and agrees to view in [place].   [name], PharmD, Para March, CPP  Clinical Pharmacist Practitioner  [facility] [phone]

## 2021-08-19 NOTE — Progress Notes (Signed)
Chronic Care Management Pharmacy Note  08/19/2021 Name:  Ivan Kennedy MRN:  [id number] DOB:  August 16, 1945  Summary: Patient presents for CCM follow-up. Home blood pressures improved with chlorthalidone, but patient has worsening hypotension symptoms.   Recommendations/Changes made from today's visit: DECREASE Chlorthalidone to 12.5 mg daily. Will have CMA call patient in 1-2 weeks to assess if hypotension symptoms improve. If not, will plan to change chlorthalidone to amlodipine.   Plan: CPP follow-up 3 months.   Subjective: Ivan Kennedy is an 76 y.o. year old male who is a primary patient of Bacigalupo, Dionne Bucy, MD.  The CCM team was consulted for assistance with disease management and care coordination needs.    Engaged with patient by telephone for follow up visit in response to provider referral for pharmacy case management and/or care coordination services.   Consent to Services:  The patient was given information about Chronic Care Management services, agreed to services, and gave verbal consent prior to initiation of services.  Please see initial visit note for detailed documentation.   Patient Care Team: Virginia Crews, MD as PCP - General (Family Medicine) Arelia Sneddon, [place] as Consulting Physician (Optometry) Ree Edman, MD as Consulting Physician (Dermatology) Jonathon Bellows, MD as Consulting Physician (Gastroenterology) Germaine Pomfret, [facility] (Pharmacist)  Recent office visits: 03/24/21: Patient presented to Dr. Brita Romp for follow-up. A1c decreased to 5.9%. Glimepiride decreased from BID to daily.   03/03/21: Patient presented to Dr. Brita Romp for follow-up. HCTZ stopped.  Recent consult visits: None noted.  Hospital visits: None in previous 6 months  Objective:  Lab Results  Component Value Date   CREATININE 1.01 06/28/2021   BUN 15 06/28/2021   GFRNONAA 73 11/06/2019   GFRAA 84 11/06/2019   NA 141 06/28/2021   K 3.8 06/28/2021    CALCIUM 9.7 06/28/2021   CO2 26 06/28/2021   GLUCOSE 105 (H) 06/28/2021    Lab Results  Component Value Date/Time   HGBA1C 6.4 (A) 06/28/2021 08:22 AM   HGBA1C 5.9 (A) 03/24/2021 08:22 AM   HGBA1C 8.0 (H) 12/23/2020 09:32 AM   HGBA1C 7.2 (H) 11/06/2019 09:32 AM   MICROALBUR 20 04/24/2018 08:29 AM   MICROALBUR 50 11/02/2015 11:23 AM    Last diabetic Eye exam:  Lab Results  Component Value Date/Time   HMDIABEYEEXA No Retinopathy 02/24/2021 12:00 AM    Last diabetic Foot exam: No results found for: HMDIABFOOTEX   Lab Results  Component Value Date   CHOL 132 12/23/2020   HDL 45 12/23/2020   LDLCALC 65 12/23/2020   TRIG 126 12/23/2020   CHOLHDL 2.9 12/23/2020    Hepatic Function Latest Ref Rng & Units 12/23/2020 11/06/2019 10/30/2018  Total Protein 6.0 - 8.5 g/dL 6.4 6.5 5.9(L)  Albumin 3.7 - 4.7 g/dL 4.4 4.4 4.3  AST 0 - 40 IU/L _0 ALT 0 - 44 IU/L 25 38 33  Alk Phosphatase 44 - 121 IU/L 53 58 57  Total Bilirubin 0.0 - 1.2 mg/dL 0.7 0.9 0.9    Lab Results  Component Value Date/Time   TSH 1.650 10/30/2018 10:17 AM   TSH 1.460 10/30/2017 08:56 AM    CBC Latest Ref Rng & Units 11/06/2019 10/30/2018 10/30/2017  WBC 3.4 - 10.8 x10E3/uL 6.5 6.1 5.4  Hemoglobin 13.0 - 17.7 g/dL 13.5 13.8 14.2  Hematocrit 37.5 - 51.0 % 38.2 39.7 41.6  Platelets 150 - 450 x10E3/uL 172 150 145(L)    No results found for: VD25OH  Clinical ASCVD:  No  The 10-year ASCVD risk score (Arnett DK, et al., 2019) is: 45.5%   Values used to calculate the score:     Age: 76 years     Sex: Male     Is Non-Hispanic African American: No     Diabetic: Yes     Tobacco smoker: No     Systolic Blood Pressure: 597 mmHg     Is BP treated: Yes     HDL Cholesterol: 45 mg/dL     Total Cholesterol: 132 mg/dL    Depression screen [facility] 2/9 06/28/2021 03/24/2021 03/03/2021  Decreased Interest 0 0 0  Down, Depressed, Hopeless 0 0 0  PHQ - 2 Score 0 0 0  Altered sleeping 0 0 0  Tired, decreased energy 0 1 1   Change in appetite 1 0 0  Feeling bad or failure about yourself  0 0 0  Trouble concentrating 0 0 0  Moving slowly or fidgety/restless 0 0 0  Suicidal thoughts 0 0 0  PHQ-9 Score _0 Difficult doing work/chores Not difficult at all Not difficult at all Not difficult at all    Social History   Tobacco Use  Smoking Status Never  Smokeless Tobacco Never   BP Readings from Last 3 Encounters:  06/28/21 (!) 144/86  06/08/21 (!) 146/95  05/25/21 (!) 140/97   Pulse Readings from Last 3 Encounters:  06/28/21 72  06/08/21 68  05/25/21 66   Wt Readings from Last 3 Encounters:  06/28/21 243 lb (110.2 kg)  06/08/21 239 lb (108.4 kg)  05/25/21 243 lb 4.8 oz (110.4 kg)   BMI Readings from Last 3 Encounters:  06/28/21 32.06 kg/m  06/08/21 31.53 kg/m  05/25/21 32.10 kg/m    Assessment/Interventions: Review of patient past medical history, allergies, medications, health status, including review of consultants reports, laboratory and other test data, was performed as part of comprehensive evaluation and provision of chronic care management services.   SDOH:  (Social Determinants of Health) assessments and interventions performed: Yes SDOH Interventions    Flowsheet Row Most Recent Value  SDOH Interventions   Financial Strain Interventions Intervention Not Indicated        SDOH Screenings   Alcohol Screen: Low Risk    Last Alcohol Screening Score (AUDIT): 2  Depression (PHQ2-9): Low Risk    PHQ-2 Score: 1  Financial Resource Strain: Low Risk    Difficulty of Paying Living Expenses: Not hard at all  Food Insecurity: Not on file  Housing: Not on file  Physical Activity: Not on file  Social Connections: Not on file  Stress: Not on file  Tobacco Use: Low Risk    Smoking Tobacco Use: Never   Smokeless Tobacco Use: Never   Passive Exposure: Not on file  Transportation Needs: Not on file    City [place]  No Known Allergies  Medications Reviewed Today      Reviewed by Virginia Crews, MD (Physician) on 06/28/21 at [place] List Status: <None>   Medication Order Taking? Sig Documenting Provider Last Dose Status Informant  aspirin EC 81 MG tablet [id number] Yes Take 81 mg by mouth daily.  [provider] Taking Active            Med Note Wilson Singer   Tue Nov 09, 2015 10:46 AM) Received from: [place]  Blood Glucose Monitoring Suppl (ONE TOUCH ULTRA 2) w/Device KIT [id number] Yes To check blood sugar once daily Mar Daring,  PA-C Taking Active   doxycycline (VIBRAMYCIN) 50 MG capsule [id number] Yes Take 50 mg by mouth 2 (two) times daily. [provider] Taking Active            Med Note Michaelle Birks, [place]   Tue Jan 04, 2021  3:18 PM) Prescribed by Dr. Aubery Lapping  glimepiride (AMARYL) 4 MG tablet [id number] Yes Take 1 tablet (4 mg total) by mouth daily with breakfast. Virginia Crews, MD Taking Active   hydrochlorothiazide (HYDRODIURIL) 12.5 MG tablet [id number] Yes Take 1 tablet (12.5 mg total) by mouth daily. Virginia Crews, MD  Active   Lancets [facility] SOFT [facility]) lancets [id number] Yes To check blood glucose daily Mar Daring, [place] Taking Active   loratadine (CLARITIN) 10 MG tablet [id number] Yes Take 10 mg by mouth daily. [provider] Taking Active   losartan (COZAAR) 100 MG tablet [id number] Yes TAKE 1 TABLET BY MOUTH EVERY DAY Bacigalupo, Dionne Bucy, MD Taking Active   metoprolol succinate (TOPROL-XL) 25 MG 24 hr tablet [id number] Yes TAKE 1 TABLET (25 MG TOTAL) BY MOUTH DAILY. Virginia Crews, MD Taking Active   metroNIDAZOLE (METROGEL) 1 % gel [id number] Yes Apply 1 application topically every morning. [provider] Taking Active            Med Note Wilmon Arms Apr 21, 2015  2:29 PM) Received from: External Pharmacy  omeprazole (PRILOSEC) 40 MG capsule [id number] Yes TAKE 1 CAPSULE BY MOUTH EVERY DAY Brita Romp Dionne Bucy, MD  Taking Active   [facility] ULTRA test strip [id number] Yes CHECK BLOOD SUGAR ONCE DAILY Mar Daring, [place] Taking Active   pioglitazone (ACTOS) 45 MG tablet [id number] Yes TAKE 1 TABLET BY MOUTH EVERY DAY Bacigalupo, Dionne Bucy, MD Taking Active   Semaglutide (RYBELSUS) 14 MG TABS [id number] Yes TAKE 14 MG (1 TABLET) BY MOUTH DAILY BEFORE BREAKFAST. TAKE ON AN EMPTY STOMACH WITH A SIP OF WATER Virginia Crews, MD Taking Active   simvastatin (ZOCOR) 20 MG tablet [id number] Yes TAKE 1 TABLET BY MOUTH EVERY DAY IN THE EVENING Bacigalupo, Dionne Bucy, MD Taking Active   tretinoin (RETIN-A) 0.05 % cream [id number] Yes Apply 1 application topically at bedtime. Mar Daring, PA-C Taking Active   vitamin B-12 (CYANOCOBALAMIN) 1000 MCG tablet [id number] Yes Take 1,000 mcg by mouth daily. [provider] Taking Active             Patient Active Problem List   Diagnosis Date Noted   Obesity 03/03/2021   Benign paroxysmal positional vertigo 12/23/2020   Hallux valgus 07/31/2018   IBS (irritable bowel syndrome) 01/21/2015   B12 deficiency 01/07/2015   Hyperlipidemia associated with type 2 diabetes mellitus ([place]) 01/07/2015   Cannot sleep 01/07/2015   Fatty liver disease, nonalcoholic 30/94/0768   Osteoarthritis of knee 01/07/2015   Acne erythematosa 01/07/2015   Disorder of male genital organs 01/07/2015   Atypical chest pain 10/09/2014   Acid reflux 09/06/2009   Diabetes mellitus, type 2 ([place]) 11/20/2008   Colon, diverticulosis 11/20/2008   Hypertension associated with diabetes ([place]) 11/20/2008   Apnea, sleep 11/20/2008   History of artificial joint 11/20/2008    Immunization History  Administered Date(s) Administered   Fluad Quad(high Dose 65+) 05/08/2019, 06/23/2020, 06/28/2021   Hepatitis B, adult 05/29/2012, 07/29/2012, 03/18/2013   Influenza, High Dose Seasonal PF 07/06/2014, 05/06/2015, 07/11/2016, 04/26/2017, 04/24/2018   PFIZER(Purple Top)SARS-COV-2 Vaccination  09/29/2019, 10/28/2019   Pneumococcal Conjugate-13 07/06/2014   Pneumococcal Polysaccharide-23 05/29/2012, 10/30/2018  Conditions to be addressed/monitored:  Hypertension, Hyperlipidemia, Diabetes, GERD, Osteoarthritis and IBS  Care Plan : General Pharmacy (Adult)  Updates made by Germaine Pomfret, RPH since 08/19/2021 12:00 AM     Problem: Hypertension, Hyperlipidemia, Diabetes, GERD, Osteoarthritis and IBS   Priority: High     Long-Range Goal: Patient-Specific Goal   Start Date: 01/04/2021  Expected End Date: 04/26/2022  This Visit's Progress: On track  Recent Progress: On track  Priority: High  Note:   Current Barriers:  Unable to achieve control of diabetes   Pharmacist Clinical Goal(s):  Patient will achieve control of diabetes as evidenced by A1c less than 8% through collaboration with PharmD and provider.   Interventions: 1:1 collaboration with Virginia Crews, MD regarding development and update of comprehensive plan of care as evidenced by provider attestation and co-signature Inter-disciplinary care team collaboration (see longitudinal plan of care) Comprehensive medication review performed; medication list updated in electronic medical record  Hypertension (BP goal <140/90) -Not ideally Controlled -Current treatment: Chlorthalidone 25 mg daily: Appropriate, Effective, Query Safe Losartan 100 mg daily: Appropriate, Effective, Query Safe Metoprolol XL 25 mg daily: Appropriate (history of skipped beats), Effective, Query Safe -Medications previously tried: HCTZ (Hypotension)   -Current home readings:  114/70 116/67 125/65 110/61 105/62 -Reports hypotension symptoms: Dizziness, lightheadedness. Symptoms worse with postural changes.  -DECREASE Chlorthalidone to 12.5 mg daily. Will have CMA call patient in 1-2 weeks to assess if hypotension symptoms improve. If not, will plan to change chlorthalidone to amlodipine.   Hyperlipidemia: (LDL goal <  70) -Controlled -Current treatment: Simvastatin 20 mg nightly  -Current antiplatelet treatment: Aspirin 81 mg daily  -Medications previously tried: NA  -Educated on Importance of limiting foods high in cholesterol; -Recommended to continue current medication  Diabetes (A1c goal <8%) -Controlled -Current medications: Glimepiride 4 mg daily   Pioglitazone 45 mg daily  Rybelsus 14 mg daily  -Medications previously tried: Metformin IR + ER (GI), Januvia (DDI with GLP-1), Invokana (dizziness), Trulicity, glipizide,    -Current home glucose readings: 144, 132, 131, 168, 131  -Denies hypoglycemic/hyperglycemic symptoms -Current meal patterns: Avoids sweet drinks, desserts, breakfast: Ham biscuit + water lunch: Hot dog + Pack chips + water   dinner: Country style steak + coleslaw + rice + green beans + biscuit + water Snacks: strawberries  drinks: Primarily water. Zero Pepsi every other day,  -Current exercise: walking most days  -Continue current medications  GERD (Goal: Prevent heartburn/reflux symptoms) -Controlled -Current treatment  Omeprazole 40 mg daily  -Medications previously tried: NA  -Recommended to continue current medication  Allergic Rhinitis (Goal: Maintain symptom control) -Controlled -Current treatment   Claritin 1 tablet daily  -Medications previously tried: NA -Counseled on risks of increased blood pressure with pseudoephedrine.  -Recommended switching to claritin daily   Patient Goals/Self-Care Activities Patient will:  - check glucose daily, document, and provide at future appointments check blood pressure weekly, document, and provide at future appointments  Follow Up Plan: Telephone follow up appointment with care management team member scheduled for:  11/14/2021 at 3:00 PM        Medication Assistance: None required.  Patient affirms current coverage meets needs.  Patient's preferred pharmacy is:  [id number] - [address], [address] [address] Phone: [phone] Fax: [place], [address] [place] [address] Phone:  [phone] Fax: [phone]  [id number] [place]Phone: [phone]: [phone]  Uses pill box? Yes Pt endorses 100% compliance  We discussed: Current pharmacy is preferred with insurance plan and patient is satisfied with pharmacy services Patient decided to: Continue current medication management strategy  Care Plan and Follow Up Patient Decision:  Patient agrees to Care Plan and Follow-up.  Plan: Telephone follow up appointment with care management team member scheduled for:  11/14/2021 at 3:00 PM  AJunius Argyle PharmD, BPara March [phone]

## 2021-08-22 ENCOUNTER — Encounter: Payer: Self-pay | Admitting: Ophthalmology

## 2021-08-31 NOTE — Discharge Instructions (Signed)

## 2021-09-02 ENCOUNTER — Other Ambulatory Visit: Payer: Self-pay

## 2021-09-02 ENCOUNTER — Ambulatory Visit
Admission: RE | Admit: 2021-09-02 | Discharge: 2021-09-02 | Disposition: A | Discharge status: Home / Self Care | Payer: PPO | Attending: Ophthalmology | Admitting: Ophthalmology

## 2021-09-02 ENCOUNTER — Encounter: Payer: Self-pay | Admitting: Ophthalmology

## 2021-09-02 ENCOUNTER — Encounter
Admission: RE | Disposition: A | Discharge status: Home / Self Care | Payer: Self-pay | Source: Home / Self Care | Attending: Ophthalmology

## 2021-09-02 ENCOUNTER — Ambulatory Visit: Payer: PPO | Admitting: Anesthesiology

## 2021-09-02 DIAGNOSIS — H02135 Senile ectropion of left lower eyelid: Secondary | ICD-10-CM | POA: Diagnosis not present

## 2021-09-02 DIAGNOSIS — I10 Essential (primary) hypertension: Secondary | ICD-10-CM | POA: Diagnosis not present

## 2021-09-02 DIAGNOSIS — H02831 Dermatochalasis of right upper eyelid: Secondary | ICD-10-CM | POA: Insufficient documentation

## 2021-09-02 DIAGNOSIS — H02105 Unspecified ectropion of left lower eyelid: Secondary | ICD-10-CM | POA: Insufficient documentation

## 2021-09-02 DIAGNOSIS — H02102 Unspecified ectropion of right lower eyelid: Secondary | ICD-10-CM | POA: Diagnosis not present

## 2021-09-02 DIAGNOSIS — H0289 Other specified disorders of eyelid: Secondary | ICD-10-CM | POA: Insufficient documentation

## 2021-09-02 DIAGNOSIS — H02834 Dermatochalasis of left upper eyelid: Secondary | ICD-10-CM | POA: Insufficient documentation

## 2021-09-02 DIAGNOSIS — H0259 Other disorders affecting eyelid function: Secondary | ICD-10-CM | POA: Diagnosis not present

## 2021-09-02 DIAGNOSIS — H02132 Senile ectropion of right lower eyelid: Secondary | ICD-10-CM | POA: Diagnosis not present

## 2021-09-02 DIAGNOSIS — G4730 Sleep apnea, unspecified: Secondary | ICD-10-CM | POA: Insufficient documentation

## 2021-09-02 DIAGNOSIS — K219 Gastro-esophageal reflux disease without esophagitis: Secondary | ICD-10-CM | POA: Insufficient documentation

## 2021-09-02 DIAGNOSIS — E119 Type 2 diabetes mellitus without complications: Secondary | ICD-10-CM | POA: Diagnosis not present

## 2021-09-02 DIAGNOSIS — H02053 Trichiasis without entropian right eye, unspecified eyelid: Secondary | ICD-10-CM | POA: Diagnosis not present

## 2021-09-02 HISTORY — PX: BROW LIFT: SHX178

## 2021-09-02 LAB — GLUCOSE, CAPILLARY
Glucose-Capillary: 130 mg/dL — ABNORMAL HIGH (ref 70–99)
Glucose-Capillary: 146 mg/dL — ABNORMAL HIGH (ref 70–99)

## 2021-09-02 SURGERY — BLEPHAROPLASTY
Anesthesia: Monitor Anesthesia Care | Site: Eye | Laterality: Bilateral

## 2021-09-02 MED ORDER — ERYTHROMYCIN 5 MG/GM OP OINT: TOPICAL_OINTMENT | OPHTHALMIC | 2 refills | Status: DC

## 2021-09-02 MED ORDER — LIDOCAINE-EPINEPHRINE 2 %-1:100000 IJ SOLN
INTRAMUSCULAR | Status: DC | PRN
  Administered 2021-09-02: 10 mL via OPHTHALMIC
  Administered 2021-09-02: 3 mL via OPHTHALMIC
  Administered 2021-09-02: 2 mL via OPHTHALMIC

## 2021-09-02 MED ORDER — TRAMADOL HCL 50 MG PO TABS: ORAL_TABLET | ORAL | 0 refills | Status: DC

## 2021-09-02 MED ORDER — BSS IO SOLN
INTRAOCULAR | Status: DC | PRN
  Administered 2021-09-02: 15 mL

## 2021-09-02 MED ORDER — TETRACAINE HCL 0.5 % OP SOLN
OPHTHALMIC | Status: DC | PRN
  Administered 2021-09-02: 1 [drp] via OPHTHALMIC

## 2021-09-02 MED ORDER — MIDAZOLAM HCL 2 MG/2ML IJ SOLN
INTRAMUSCULAR | Status: DC | PRN
  Administered 2021-09-02 (×2): 1 mg via INTRAVENOUS

## 2021-09-02 MED ORDER — PROPOFOL 500 MG/50ML IV EMUL
INTRAVENOUS | Status: DC | PRN
  Administered 2021-09-02: 25 ug/kg/min via INTRAVENOUS

## 2021-09-02 MED ORDER — LACTATED RINGERS IV SOLN: INTRAVENOUS | Status: DC

## 2021-09-02 MED ORDER — ALFENTANIL 500 MCG/ML IJ INJ
INJECTION | INTRAVENOUS | Status: DC | PRN
  Administered 2021-09-02 (×2): 250 ug via INTRAVENOUS
  Administered 2021-09-02: 500 ug via INTRAVENOUS

## 2021-09-02 MED ORDER — ERYTHROMYCIN 5 MG/GM OP OINT
TOPICAL_OINTMENT | OPHTHALMIC | Status: DC | PRN
  Administered 2021-09-02: 1 via OPHTHALMIC

## 2021-09-02 SURGICAL SUPPLY — 25 items
APPLICATOR COTTON TIP WD 3 STR (MISCELLANEOUS) ×3 IMPLANT
BLADE SURG 15 STRL LF DISP TIS (BLADE) ×1 IMPLANT
BLADE SURG 15 STRL SS (BLADE) ×2
CORD BIP STRL DISP 12FT (MISCELLANEOUS) ×2 IMPLANT
GAUZE SPONGE 4X4 12PLY STRL (GAUZE/BANDAGES/DRESSINGS) ×2 IMPLANT
GLOVE SURG UNDER POLY LF SZ7 (GLOVE) ×1 IMPLANT
GOWN STRL REUS W/ TWL LRG LVL3 (GOWN DISPOSABLE) ×1 IMPLANT
GOWN STRL REUS W/TWL LRG LVL3 (GOWN DISPOSABLE) ×2
MARKER SKIN XFINE TIP W/RULER (MISCELLANEOUS) ×2 IMPLANT
NDL FILTER BLUNT 18X1 1/2 (NEEDLE) ×1 IMPLANT
NDL HYPO 30X.5 LL (NEEDLE) ×2 IMPLANT
NEEDLE FILTER BLUNT 18X 1/2SAF (NEEDLE) ×1
NEEDLE FILTER BLUNT 18X1 1/2 (NEEDLE) ×1 IMPLANT
NEEDLE HYPO 30X.5 LL (NEEDLE) ×4 IMPLANT
PACK ENT CUSTOM (PACKS) ×2 IMPLANT
SOL PREP PVP 2OZ (MISCELLANEOUS) ×2
SOLUTION PREP PVP 2OZ (MISCELLANEOUS) ×1 IMPLANT
SPONGE GAUZE 2X2 8PLY STRL LF (GAUZE/BANDAGES/DRESSINGS) ×20 IMPLANT
SUT GUT PLAIN 6-0 1X18 ABS (SUTURE) ×3 IMPLANT
SUT MERSILENE 4-0 S-2 (SUTURE) ×2 IMPLANT
SUT VICRYL 6-0  S14 CTD (SUTURE) ×2
SUT VICRYL 6-0 S14 CTD (SUTURE) IMPLANT
SYR 10ML LL (SYRINGE) ×2 IMPLANT
SYR 3ML LL SCALE MARK (SYRINGE) ×2 IMPLANT
WATER STERILE IRR 250ML POUR (IV SOLUTION) ×2 IMPLANT

## 2021-09-02 NOTE — Op Note (Signed)
Preoperative Diagnosis:   1.  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 2.  Visually significant dermatochalasis both  Upper Eyelid(s) 3.  Lower eyelid laxity with ectropion, both   lower eyelid(s).  Postoperative Diagnosis:   Same.  Procedure(s) Performed:  1.  Full-thickness wedge excision with layered closure,  both  upper eyelid(s) 2.  Upper eyelid blepharoplasty with excess skin excision  both  Upper Eyelid(s) 3.  Lateral tarsal strip procedure,  both  lower eyelid(s).  Surgeon: Philis Pique. Vickki Muff, M.D.  Assistants: none  Anesthesia: MAC  Specimens: none  Estimated Blood Loss: Minimal.  Complications: None.  Operative Findings: 1 cm wedges removed from both upper eyelid   Procedure:   Allergies were reviewed and the patient Patient has no known allergies..    After discussing the risks, benefits, complications, and alternatives with the patient, appropriate informed consent was obtained. The patient was brought to the operating suite and reclined supine. Time out was conducted and the patient was sedated.  Local anesthetic consisting of a 50-50 mixture of 2% lidocaine with epinephrine and 0.75% bupivacaine with added Hylenex was injected subcutaneously to the both   Upper and lower eyelid(s). Additional anesthetic was injected subconjunctivally to the both  upper and lower eyelid(s).  Finally, anesthetic was injected down to the periosteum of the both  lateral orbital rim(s).  After adequate local was instilled, the patient was prepped and draped in the usual sterile fashion for eyelid surgery.   Attention was turned to the right  upper eyelid.  A 78m upper eyelid crease incision line was marked with calipers on both  upper eyelid(s).  A pinch test was used to estimate the amount of excess skin to remove and this was marked in standard blepharoplasty style fashion.    A #15 blade was used to open the pre-marked blepharoplasty incision line.  A Skin and  muscle flap was excised and hemostasis was obtained with bipolar cautery.   A sterile marking pen was then used to mark a maximally sized pentagonal wedge excision line laterally, taking care to not create too much horizontal tension across the eyelid. A Westcott scissor was used to incise full-thickness along the premarked line of the pentagonal wedge.  6-0 Vicryl sutures were then used to close the lid margin with 2 vertical mattress sutures. The ends were kept long and placed in a bulldog clamp. The tarsal plate was reapproximated with interrupted 6-0 Vicryl sutures. Conjunctiva and retractors were reapproximated with interrupted 6-0 Vicryl sutures. Horizontal subcuticular 6-0 Vicryl sutures were used to reapproximate the orbicularis. The pretarsal skin edges were closed with interrupted 6-0 Vicryl sutures taking care to incorporate the long ends of the margin sutures.  The blepharoplasty incision was then closed with a combination of running and interrupted 6-0 plain gut suture.  Attention was then turned to the opposite eyelid where the same procedure was performed in the same manner.  Attention was turned to the right  lateral canthal angle. Westcott scissors were used to create a lateral canthotomy. Hemostasis was obtained with bipolar cautery. An inferior cantholysis was then performed with additional bipolar hemostasis. The anterior and posterior lamella of the lid were divided for approximately 8 mm.  A strip of the epithelium was excised off the superior margin of the tarsal strip and conjunctiva and retractors were incised off the inferior margin of the tarsal strip. A double-armed 4-0 Mersilene suture was then passed each arm through the terminal portion of the tarsal strip.  Each arm of the suture was then passed through the periosteum of the inner portion of the lateral orbital rim at the level of Whitnall's tubercle. The sutures were advanced and this provided nice elevation and tightening of  the lower eyelid. Once the suture was secured, a thin strip of follicle-bearing skin was excised. The lateral canthal angle was reformed with an interrupted 6-0 plain suture. Orbicularis was reapproximated with horizontal subcuticular 6-0 fast absorbing plain gut sutures. The skin was closed with interrupted 6-0 plain gut sutures.   Attention was then turned to the opposite eyelid where the same procedure was performed in the same manner.   The patient tolerated the procedure well. Erythromycin ophthalmic  ophthalmic ointment was applied to the incision site(s) followed by ice packs. The patient was taken to the recovery area where they recovered without difficulty.  Post-Op Plan/Instructions:   The patient was instructed to use ice packs frequently for the next 48 hours. They were instructed to use Erythromycin ophthalmic ophthalmic  ointment on their incisions 4 times a day for the next 12 to 14 days. They were given a prescription for tramadol (or similar) for pain control should Tylenol not be effective. They were  asked to to follow up at the [facility] in [place], [place] in 2 weeks' time or sooner as needed for problems.  Shandon Burlingame M. Vickki Muff, M.D. Ophthalmology

## 2021-09-02 NOTE — Transfer of Care (Signed)
Immediate Anesthesia Transfer of Care Note  Patient: Ivan Kennedy  Procedure(s) Performed: BLEPHAROPLASTY UPPER EYELID; W/EXCESS SKIN ECTROPION REPAIR, EXTENSIVE WEDGE EXCISION, UPPER BILATERAL (Bilateral: Eye)  Patient Location: PACU  Anesthesia Type: MAC  Level of Consciousness: awake, alert  and patient cooperative  Airway and Oxygen Therapy: Patient Spontanous Breathing and Patient connected to supplemental oxygen  Post-op Assessment: Post-op Vital signs reviewed, Patient's Cardiovascular Status Stable, Respiratory Function Stable, Patent Airway and No signs of Nausea or vomiting  Post-op Vital Signs: Reviewed and stable  Complications: No notable events documented.

## 2021-09-02 NOTE — H&P (Signed)
[place]: [facility]  Primary Care Physician:  Virginia Crews, MD Ophthalmologist: Dr. Philis Pique. Vickki Muff, M.D.  Pre-Procedure History & Physical: HPI:  Ivan Kennedy is a 76 y.o. male here for periocular surgery.   Past Medical History:  Diagnosis Date   Colon polyp 2005   Diabetes mellitus ([place])    GERD (gastroesophageal reflux disease)    Hyperlipidemia    Hypertension    Rosacea    Sleep apnea    CPAP    Past Surgical History:  Procedure Laterality Date   ANAL FISSURE REPAIR  1998   CATARACT EXTRACTION W/PHACO Right 05/25/2021   Procedure: CATARACT EXTRACTION PHACO AND INTRAOCULAR LENS PLACEMENT ([place]) RIGHT DIABETIC 6.64 00:54.7;  Surgeon: Leandrew Koyanagi, MD;  Location: [place];  Service: Ophthalmology;  Laterality: Right;  Diabetic, sleep apnea   CATARACT EXTRACTION W/PHACO Left 06/08/2021   Procedure: CATARACT EXTRACTION PHACO AND INTRAOCULAR LENS PLACEMENT ([place]) LEFT DIABETIC;  Surgeon: Leandrew Koyanagi, MD;  Location: [place];  Service: Ophthalmology;  Laterality: Left;  Diabetic, sleep apnea 7.57 01:02.2   COLONOSCOPY  2005, 2010   COLONOSCOPY WITH PROPOFOL N/A 01/19/2020   Procedure: COLONOSCOPY WITH PROPOFOL;  Surgeon: Jonathon Bellows, MD;  Location: [facility] ENDOSCOPY;  Service: Gastroenterology;  Laterality: N/A;   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 Earnestine Leys   shoulder spurs Right 2000    Prior to Admission medications   Medication Sig Start Date End Date Taking? Authorizing Provider  aspirin EC 81 MG tablet Take 81 mg by mouth daily.    Yes [provider]  chlorthalidone (HYGROTON) 25 MG tablet Take 0.5 tablets (12.5 mg total) by mouth daily. 08/19/21  Yes Bacigalupo, Dionne Bucy, MD  doxycycline (VIBRAMYCIN) 50 MG capsule Take 50 mg by mouth 2 (two) times daily.   Yes [provider]  glimepiride (AMARYL) 4 MG tablet Take 1 tablet (4 mg total) by mouth daily with breakfast. 03/24/21  Yes  Bacigalupo, Dionne Bucy, MD  loratadine (CLARITIN) 10 MG tablet Take 10 mg by mouth daily.   Yes [provider]  losartan (COZAAR) 100 MG tablet TAKE 1 TABLET BY MOUTH EVERY DAY 04/21/21  Yes Bacigalupo, Dionne Bucy, MD  metoprolol succinate (TOPROL-XL) 25 MG 24 hr tablet TAKE 1 TABLET (25 MG TOTAL) BY MOUTH DAILY. 05/10/21  Yes Bacigalupo, Dionne Bucy, MD  omeprazole (PRILOSEC) 40 MG capsule TAKE 1 CAPSULE BY MOUTH EVERY DAY 05/23/21  Yes Bacigalupo, Dionne Bucy, MD  pioglitazone (ACTOS) 45 MG tablet TAKE 1 TABLET BY MOUTH EVERY DAY 05/23/21  Yes Bacigalupo, Dionne Bucy, MD  Semaglutide (RYBELSUS) 14 MG TABS TAKE 14 MG (1 TABLET) BY MOUTH DAILY BEFORE BREAKFAST. TAKE ON AN EMPTY STOMACH WITH A SIP OF WATER 03/11/21  Yes Bacigalupo, Dionne Bucy, MD  simvastatin (ZOCOR) 20 MG tablet TAKE 1 TABLET BY MOUTH EVERY DAY IN THE EVENING 06/16/21  Yes Bacigalupo, Dionne Bucy, MD  tretinoin (RETIN-A) 0.05 % cream Apply 1 application topically at bedtime. 09/24/20  Yes Mar Daring, PA-C  vitamin B-12 (CYANOCOBALAMIN) 1000 MCG tablet Take 1,000 mcg by mouth daily.   Yes [provider]  Blood Glucose Monitoring Suppl (ONE TOUCH ULTRA 2) w/Device KIT To check blood sugar once daily 03/25/20   Mar Daring, PA-C  Lancets (ACCU-CHEK SOFT TOUCH) lancets To check blood glucose daily 08/22/17   Mar Daring, PA-C  metroNIDAZOLE (METROGEL) 1 % gel Apply 1 application topically every morning. 03/30/15   [provider]  ONETOUCH ULTRA test strip CHECK BLOOD SUGAR ONCE DAILY 06/22/20   Mar Daring, PA-C    Allergies as of 06/30/2021   (No Known Allergies)    Family History  Problem Relation Age of Onset   Diabetes Mother     Social History   Socioeconomic History   Marital status: Married    Spouse name: Not on file   Number of children: 1   Years of education: Not on file   Highest education level: GED or equivalent  Occupational History   Occupation: retired  Tobacco Use    Smoking status: Never   Smokeless tobacco: Never  Vaping Use   Vaping Use: Never used  Substance and Sexual Activity   Alcohol use: Yes    Alcohol/week: 0.0 standard drinks    Comment: occasionally- beer 1-2/month   Drug use: No   Sexual activity: Not on file  Other Topics Concern   Not on file  Social History Narrative   Not on file   Social Determinants of Health   Financial Resource Strain: Low Risk    Difficulty of Paying Living Expenses: Not hard at all  Food Insecurity: Not on file  Transportation Needs: Not on file  Physical Activity: Not on file  Stress: Not on file  Social Connections: Not on file  Intimate Partner Violence: Not on file    Review of Systems: See HPI, otherwise negative ROS  Physical Exam: BP 119/79 (BP Location: Right Arm)    Pulse 67    Temp (!) 97.2 F (36.2 C)    Resp 14    Ht 6' 1"  (1.854 m)    Wt 106.1 kg    SpO2 97%    BMI 30.87 kg/m  General:   Alert and cooperative in NAD Head:  Normocephalic and atraumatic. Respiratory:  Normal work of breathing.  Impression/Plan: Ivan Kennedy is here for periocular surgery.  Risks, benefits, limitations, and alternatives regarding surgery have been reviewed with the patient.  Questions have been answered.  All parties agreeable.   Karle Starch, MD  09/02/2021, 11:12 AM

## 2021-09-02 NOTE — Anesthesia Procedure Notes (Signed)
Procedure Name: MAC Date/Time: 09/02/2021 9:56 AM Performed by: Cameron Ali, CRNA Pre-anesthesia Checklist: Patient identified, Emergency Drugs available, Suction available, Timeout performed and Patient being monitored Patient Re-evaluated:Patient Re-evaluated prior to induction Oxygen Delivery Method: Nasal cannula Placement Confirmation: positive ETCO2

## 2021-09-02 NOTE — Interval H&P Note (Signed)
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  The various methods of treatment have been discussed with the patient and family. After consideration of risks, benefits and other options for treatment, the patient has consented to  Procedure(s) with comments: BLEPHAROPLASTY UPPER EYELID; W/EXCESS SKIN ECTROPION REPAIR, EXTENSIVE WEDGE EXCISION, UPPER BILATERAL (Bilateral) - Diabetic  sleep apnea as a surgical intervention.  The patient's history has been reviewed, patient examined, no change in status, stable for surgery.  I have reviewed the patient's chart and labs.  Questions were answered to the patient's satisfaction.     Vickki Muff, Augusten Lipkin M

## 2021-09-02 NOTE — Anesthesia Postprocedure Evaluation (Signed)
Anesthesia Post Note  Patient: Ivan Kennedy  Procedure(s) Performed: BLEPHAROPLASTY UPPER EYELID; W/EXCESS SKIN ECTROPION REPAIR, EXTENSIVE WEDGE EXCISION, UPPER BILATERAL (Bilateral: Eye)     Patient location during evaluation: PACU Anesthesia Type: MAC Level of consciousness: awake and alert and oriented Pain management: satisfactory to patient Vital Signs Assessment: post-procedure vital signs reviewed and stable Respiratory status: spontaneous breathing, nonlabored ventilation and respiratory function stable Cardiovascular status: blood pressure returned to baseline and stable Postop Assessment: Adequate PO intake and No signs of nausea or vomiting Anesthetic complications: no   No notable events documented.  Raliegh Ip

## 2021-09-02 NOTE — Anesthesia Preprocedure Evaluation (Signed)
Anesthesia Evaluation  Patient identified by MRN, date of birth, ID band Patient awake    Reviewed: Allergy & Precautions, H&P , NPO status , Patient's Chart, lab work & pertinent test results  Airway Mallampati: II  TM Distance: >3 FB Neck ROM: full    Dental no notable dental hx.    Pulmonary sleep apnea and Continuous Positive Airway Pressure Ventilation ,    Pulmonary exam normal breath sounds clear to auscultation       Cardiovascular hypertension, Normal cardiovascular exam Rhythm:regular Rate:Normal     Neuro/Psych    GI/Hepatic GERD  ,  Endo/Other  diabetesoverweight  Renal/GU      Musculoskeletal   Abdominal   Peds  Hematology   Anesthesia Other Findings   Reproductive/Obstetrics                             Anesthesia Physical Anesthesia Plan  ASA: 2  Anesthesia Plan: MAC   Post-op Pain Management: Minimal or no pain anticipated   Induction:   PONV Risk Score and Plan: 1 and Treatment may vary due to age or medical condition, TIVA and Ondansetron  Airway Management Planned:   Additional Equipment:   Intra-op Plan:   Post-operative Plan:   Informed Consent: I have reviewed the patients History and Physical, chart, labs and discussed the procedure including the risks, benefits and alternatives for the proposed anesthesia with the patient or authorized representative who has indicated his/her understanding and acceptance.     Dental Advisory Given  Plan Discussed with: CRNA  Anesthesia Plan Comments:         Anesthesia Quick Evaluation

## 2021-09-05 ENCOUNTER — Encounter: Payer: Self-pay | Admitting: Ophthalmology

## 2021-09-13 DIAGNOSIS — H16142 Punctate keratitis, left eye: Secondary | ICD-10-CM | POA: Diagnosis not present

## 2021-09-13 DIAGNOSIS — E1169 Type 2 diabetes mellitus with other specified complication: Secondary | ICD-10-CM | POA: Diagnosis not present

## 2021-09-13 DIAGNOSIS — I152 Hypertension secondary to endocrine disorders: Secondary | ICD-10-CM

## 2021-09-13 DIAGNOSIS — E1159 Type 2 diabetes mellitus with other circulatory complications: Secondary | ICD-10-CM | POA: Diagnosis not present

## 2021-09-19 ENCOUNTER — Ambulatory Visit: Payer: Self-pay | Admitting: *Deleted

## 2021-09-19 NOTE — Telephone Encounter (Signed)
Patient advised and verbalized understanding. He will let us know if it does not improve

## 2021-09-19 NOTE — Telephone Encounter (Signed)
Reason for Disposition  Blood glucose > 400 mg/dL (22.2 mmol/L)    Glucose 412.   Taking prednisone  Answer Assessment - Initial Assessment Questions 1. BLOOD GLUCOSE: "What is your blood glucose level?"      My glucose is 412 this morning.  Feeling fine.  No dizziness, sweating, headaches or any symptoms.   Been on prednisone for 3 days.   For 2 tomorrow and 1 the next day and then done.  I started with 6 on Friday, 5 on Sat. And 4 yesterday.   I had eye surgery for drooped upper eyelids is reason for being on prednisone.  2. ONSET: "When did you check the blood glucose?"     This morning 3. USUAL RANGE: "What is your glucose level usually?" (e.g., usual fasting morning value, usual evening value)     120-130 is my normal. 4. KETONES: "Do you check for ketones (urine or blood test strips)?" If yes, ask: "What does the test show now?"      N/A 5. TYPE 1 or 2:  "Do you know what type of diabetes you have?"  (e.g., Type 1, Type 2, Gestational; doesn't know)      Not asked 6. INSULIN: "Do you take insulin?" "What type of insulin(s) do you use? What is the mode of delivery? (syringe, pen; injection or pump)?"      Rybelsus  7. DIABETES PILLS: "Do you take any pills for your diabetes?" If yes, ask: "Have you missed taking any pills recently?"     Not asked  8. OTHER SYMPTOMS: "Do you have any symptoms?" (e.g., fever, frequent urination, difficulty breathing, dizziness, weakness, vomiting)     None I feel fine 9. PREGNANCY: "Is there any chance you are pregnant?" "When was your last menstrual period?"     N/A  Protocols used: Diabetes - High Blood Sugar-A-AH  Chief Complaint: Elevated blood glucose while taking prednisone Symptoms: Glucose 412 this morning.   Normally 120-130s.   Taking it because had eye surgery for drooping upper eyelids. Frequency: This morning but been elevated since been on the prednisone but not this high Pertinent Negatives: Patient denies any symptoms.   "I feel  fine". Disposition: [] ED /[] Urgent Care (no appt availability in office) / [] Appointment(In office/virtual)/ []  [facility]/ [] Home Care/ [] Refused Recommended Disposition /[] [place] [facility]/ [x]  Follow-up with PCP Additional Notes: Message sent to Dr. Alvera Novel for further advise to pt.   He was agreeable to this plan.   I returned his call.   He called in concerned that his glucose was 412 this morning.   It's normally 120-130.   He has been on prednisone for 3 days.   He feels fine but was concerned about it being so elevated.  [phone]

## 2021-09-23 ENCOUNTER — Telehealth: Payer: Self-pay

## 2021-09-23 NOTE — Telephone Encounter (Signed)
Patient called to follow-up regarding home blood pressures. He continues to experience dizziness (especially when working or with postural changes), which he feels is unchanged since decreasing chlorthalidone to 12.5 mg daily. Reported blood pressures over the past month:  105/62; pulse 82  125/62; pulse 84  109/69; pulse 76 that PM was 126/69; pulse 96  143/75; pulse 86  124/83; pulse 116  117/68; pulse 84  129/98; pulse 87  87/56; pulse 96 (was working)  133/85; pulse 110 146/84; pulse 91 in PM was 153/69; 94  120/60; pulse 79  118/63; pulse 92 (after golf) 113/67; pulse 74. That PM was 87/27; pulse (was working)   Recommend stopping chlorthalidone until follow-up with PCP in one week. Would consider switching to amlodipine at next follow-up if needed.   [name], PharmD, Para March, CPP  Clinical Pharmacist Practitioner  [facility] [phone]

## 2021-09-26 DIAGNOSIS — Z961 Presence of intraocular lens: Secondary | ICD-10-CM | POA: Diagnosis not present

## 2021-09-26 NOTE — Telephone Encounter (Signed)
Noted  

## 2021-09-27 ENCOUNTER — Other Ambulatory Visit: Payer: Self-pay | Admitting: Physician Assistant

## 2021-09-27 DIAGNOSIS — E119 Type 2 diabetes mellitus without complications: Secondary | ICD-10-CM

## 2021-09-29 NOTE — Progress Notes (Signed)
Established patient visit   Patient: Ivan Kennedy   DOB: [birth year]   76 y.o. Male  MRN: [id number] Visit Date: 09/30/2021-  Today's healthcare provider: Lavon Paganini, MD   Chief Complaint  Patient presents with   Diabetes   Hypertension   Hyperlipidemia   Subjective    HPI  Hypertension, follow-up  BP Readings from Last 3 Encounters:  09/30/21 117/72  09/02/21 119/78  08/19/21 105/62   Wt Readings from Last 3 Encounters:  09/30/21 243 lb (110.2 kg)  09/02/21 234 lb (106.1 kg)  06/28/21 243 lb (110.2 kg)     He was last seen for hypertension 3 months ago.  BP at that visit was as above. Management since that visit includes adding HCTZ 12.5 mg.  He reports good compliance with treatment. He is not having side effects.  He is following a Low Sodium diet. He is exercising. He does not smoke.  Use of agents associated with hypertension: none.   Outside blood pressures are . Symptoms: No chest pain No chest pressure  No palpitations No syncope  No dyspnea No orthopnea  No paroxysmal nocturnal dyspnea No lower extremity edema   Pertinent labs: Lab Results  Component Value Date   CHOL 132 12/23/2020   HDL 45 12/23/2020   LDLCALC 65 12/23/2020   TRIG 126 12/23/2020   CHOLHDL 2.9 12/23/2020   Lab Results  Component Value Date   NA 141 06/28/2021   K 3.8 06/28/2021   CREATININE 1.01 06/28/2021   EGFR 79 06/28/2021   GLUCOSE 105 (H) 06/28/2021   TSH 1.650 10/30/2018     The 10-year ASCVD risk score (Arnett DK, et al., 2019) is: 34.1%   --------------------------------------------------------------------------------------------------- Diabetes Mellitus Type II, Follow-up  Lab Results  Component Value Date   HGBA1C 6.4 (A) 06/28/2021   HGBA1C 5.9 (A) 03/24/2021   HGBA1C 8.0 (H) 12/23/2020   Wt Readings from Last 3 Encounters:  09/30/21 243 lb (110.2 kg)  09/02/21 234 lb (106.1 kg)  06/28/21 243 lb (110.2 kg)   Last seen for diabetes 3  months ago.  Management since then includes none. He reports good compliance with treatment. He is not having side effects.  Symptoms: No fatigue No foot ulcerations  No appetite changes No nausea  No paresthesia of the feet  No polydipsia  No polyuria No visual disturbances   No vomiting     Home blood sugar records:  115-324  Episodes of hypoglycemia? No    Current insulin regiment: none Most Recent Eye Exam: 02/2021   Pertinent Labs: Lab Results  Component Value Date   CHOL 132 12/23/2020   HDL 45 12/23/2020   LDLCALC 65 12/23/2020   TRIG 126 12/23/2020   CHOLHDL 2.9 12/23/2020   Lab Results  Component Value Date   NA 141 06/28/2021   K 3.8 06/28/2021   CREATININE 1.01 06/28/2021   EGFR 79 06/28/2021   MICROALBUR 20 04/24/2018   LABMICR See below: 11/09/2015     ---------------------------------------------------------------------------------------------------   Medications: Outpatient Medications Prior to Visit  Medication Sig   aspirin EC 81 MG tablet Take 81 mg by mouth daily.    Blood Glucose Monitoring Suppl (ONE TOUCH ULTRA 2) w/Device KIT To check blood sugar once daily   doxycycline (VIBRAMYCIN) 50 MG capsule Take 50 mg by mouth 2 (two) times daily.   hydrochlorothiazide (MICROZIDE) 12.5 MG capsule Take 12.5 mg by mouth daily.   Lancets (ACCU-CHEK SOFT TOUCH) lancets To check blood  glucose daily   loratadine (CLARITIN) 10 MG tablet Take 10 mg by mouth daily.   losartan (COZAAR) 100 MG tablet TAKE 1 TABLET BY MOUTH EVERY DAY   metoprolol succinate (TOPROL-XL) 25 MG 24 hr tablet TAKE 1 TABLET (25 MG TOTAL) BY MOUTH DAILY.   metroNIDAZOLE (METROGEL) 1 % gel Apply 1 application topically every morning.   omeprazole (PRILOSEC) 40 MG capsule TAKE 1 CAPSULE BY MOUTH EVERY DAY   ONETOUCH ULTRA test strip CHECK BLOOD SUGAR ONCE DAILY   pioglitazone (ACTOS) 45 MG tablet TAKE 1 TABLET BY MOUTH EVERY DAY   Semaglutide (RYBELSUS) 14 MG TABS TAKE 14 MG (1 TABLET)  BY MOUTH DAILY BEFORE BREAKFAST. TAKE ON AN EMPTY STOMACH WITH A SIP OF WATER   simvastatin (ZOCOR) 20 MG tablet TAKE 1 TABLET BY MOUTH EVERY DAY IN THE EVENING   tretinoin (RETIN-A) 0.05 % cream Apply 1 application topically at bedtime.   vitamin B-12 (CYANOCOBALAMIN) 1000 MCG tablet Take 1,000 mcg by mouth daily.   [DISCONTINUED] glimepiride (AMARYL) 4 MG tablet Take 1 tablet (4 mg total) by mouth daily with breakfast.   [DISCONTINUED] erythromycin ophthalmic ointment Apply to sutures 4 times a day for 10-12 days.  Discontinue if allergy develops and call our office   [DISCONTINUED] traMADol (ULTRAM) 50 MG tablet Take 1 every 4-6 hours as needed for pain not controlled by Tylenol   No facility-administered medications prior to visit.    Review of Systems per HPI     Objective    BP 117/72 (BP Location: Left Arm, Patient Position: Sitting, Cuff Size: Large)    Pulse 79    Temp 98.5 F (36.9 C) (Oral)    Wt 243 lb (110.2 kg)    SpO2 97%    BMI 32.06 kg/m  {Show previous vital signs (optional):23777}  Physical Exam Vitals reviewed.  Constitutional:      General: He is not in acute distress.    Appearance: Normal appearance. He is not diaphoretic.  HENT:     Head: Normocephalic and atraumatic.  Eyes:     General: No scleral icterus.    Conjunctiva/sclera: Conjunctivae normal.  Cardiovascular:     Rate and Rhythm: Normal rate and regular rhythm.     Pulses: Normal pulses.     Heart sounds: Normal heart sounds. No murmur heard. Pulmonary:     Effort: Pulmonary effort is normal. No respiratory distress.     Breath sounds: Normal breath sounds. No wheezing or rhonchi.  Abdominal:     General: There is no distension.     Palpations: Abdomen is soft.     Tenderness: There is no abdominal tenderness.  Musculoskeletal:     Cervical back: Neck supple.     Right lower leg: No edema.     Left lower leg: No edema.  Lymphadenopathy:     Cervical: No cervical adenopathy.  Skin:     General: Skin is warm and dry.     Capillary Refill: Capillary refill takes less than 2 seconds.     Findings: No rash.  Neurological:     Mental Status: He is alert and oriented to person, place, and time.     Cranial Nerves: No cranial nerve deficit.  Psychiatric:        Mood and Affect: Mood normal.        Behavior: Behavior normal.    Diabetic Foot Exam - Simple   Simple Foot Form Diabetic Foot exam was performed with the following findings: Yes  09/30/2021  8:33 AM  Visual Inspection No deformities, no ulcerations, no other skin breakdown bilaterally: Yes Sensation Testing Intact to touch and monofilament testing bilaterally: Yes Pulse Check Posterior Tibialis and Dorsalis pulse intact bilaterally: Yes Comments Bilateral bunions     No results found for any visits on 09/30/21.  Assessment & Plan     Problem List Items Addressed This Visit       Cardiovascular and Mediastinum   Hypertension associated with diabetes ([place])    Well controlled Continue current medications Recheck metabolic panel F/u in 6 months       Relevant Medications   hydrochlorothiazide (MICROZIDE) 12.5 MG capsule   empagliflozin (JARDIANCE) 25 MG TABS tablet   Other Relevant Orders   Comprehensive metabolic panel     Endocrine   Diabetes mellitus, type 2 ([place]) - Primary    Chronic and previously well controlled Elevation in home BG recently after prednisone Associated with HTN and HLD No more hypoglycemia Foot exam today UTD on vaccines UACR, recheck A1c Stop glimepiride and add jardiance Will attempt to get off Actos in the future also Continue Rybelsus F/u in 21m     Relevant Medications   empagliflozin (JARDIANCE) 25 MG TABS tablet   Other Relevant Orders   Hemoglobin A1c   Urine Microalbumin w/creat. ratio   Hyperlipidemia associated with type 2 diabetes mellitus ([place]    Previously well controlled Continue statin Repeat FLP and CMP Goal LDL < 70      Relevant  Medications   hydrochlorothiazide (MICROZIDE) 12.5 MG capsule   empagliflozin (JARDIANCE) 25 MG TABS tablet   Other Relevant Orders   Comprehensive metabolic panel   Lipid panel     Other   Obesity    Discussed importance of healthy weight management Discussed diet and exercise       Relevant Medications   empagliflozin (JARDIANCE) 25 MG TABS tablet     Return in about 3 months (around 12/28/2021) for chronic disease f/u, AWV.      IArgentina PonderDeSanto,acting as a scribe for ALavon Paganini MD.,have documented all relevant documentation on the behalf of ALavon Paganini MD,as directed by  ALavon Paganini MD while in the presence of ALavon Paganini MD.  I, ALavon Paganini MD, have reviewed all documentation for this visit. The documentation on 09/30/21 for the exam, diagnosis, procedures, and orders are all accurate and complete.   Jaylia Pettus, ADionne Bucy MD, MPH [facility]

## 2021-09-30 ENCOUNTER — Ambulatory Visit (INDEPENDENT_AMBULATORY_CARE_PROVIDER_SITE_OTHER): Payer: PPO | Admitting: Family Medicine

## 2021-09-30 ENCOUNTER — Other Ambulatory Visit: Payer: Self-pay

## 2021-09-30 ENCOUNTER — Encounter: Payer: Self-pay | Admitting: Family Medicine

## 2021-09-30 VITALS — BP 117/72 | HR 79 | Temp 98.5°F | Wt 243.0 lb

## 2021-09-30 DIAGNOSIS — E669 Obesity, unspecified: Secondary | ICD-10-CM

## 2021-09-30 DIAGNOSIS — I152 Hypertension secondary to endocrine disorders: Secondary | ICD-10-CM | POA: Diagnosis not present

## 2021-09-30 DIAGNOSIS — E1169 Type 2 diabetes mellitus with other specified complication: Secondary | ICD-10-CM | POA: Diagnosis not present

## 2021-09-30 DIAGNOSIS — Z6832 Body mass index (BMI) 32.0-32.9, adult: Secondary | ICD-10-CM

## 2021-09-30 DIAGNOSIS — E785 Hyperlipidemia, unspecified: Secondary | ICD-10-CM

## 2021-09-30 DIAGNOSIS — E1159 Type 2 diabetes mellitus with other circulatory complications: Secondary | ICD-10-CM | POA: Diagnosis not present

## 2021-09-30 MED ORDER — EMPAGLIFLOZIN 25 MG PO TABS: 25.0000 mg | ORAL_TABLET | Freq: Every day | ORAL | 1 refills | Status: DC

## 2021-09-30 NOTE — Assessment & Plan Note (Signed)
Well controlled Continue current medications Recheck metabolic panel F/u in 6 months  

## 2021-09-30 NOTE — Assessment & Plan Note (Signed)
Discussed importance of healthy weight management Discussed diet and exercise  

## 2021-09-30 NOTE — Assessment & Plan Note (Signed)
Previously well controlled Continue statin Repeat FLP and CMP Goal LDL < 70 

## 2021-09-30 NOTE — Assessment & Plan Note (Signed)
Chronic and previously well controlled Elevation in home BG recently after prednisone Associated with HTN and HLD No more hypoglycemia Foot exam today UTD on vaccines UACR, recheck A1c Stop glimepiride and add jardiance Will attempt to get off Actos in the future also Continue Rybelsus F/u in 70m

## 2021-09-30 NOTE — Patient Instructions (Signed)
Continue current medications, except add jardiance 25mg  daily and stop glimepiride

## 2021-10-01 LAB — COMPREHENSIVE METABOLIC PANEL
ALT: 20 IU/L (ref 0–44)
AST: 20 IU/L (ref 0–40)
Albumin/Globulin Ratio: 2.4 — ABNORMAL HIGH (ref 1.2–2.2)
Albumin: 4.1 g/dL (ref 3.7–4.7)
Alkaline Phosphatase: 50 IU/L (ref 44–121)
BUN/Creatinine Ratio: 17 (ref 10–24)
BUN: 22 mg/dL (ref 8–27)
Bilirubin Total: 0.9 mg/dL (ref 0.0–1.2)
CO2: 25 mmol/L (ref 20–29)
Calcium: 9.1 mg/dL (ref 8.6–10.2)
Chloride: 105 mmol/L (ref 96–106)
Creatinine, Ser: 1.26 mg/dL (ref 0.76–1.27)
Globulin, Total: 1.7 g/dL (ref 1.5–4.5)
Glucose: 140 mg/dL — ABNORMAL HIGH (ref 70–99)
Potassium: 3.9 mmol/L (ref 3.5–5.2)
Sodium: 144 mmol/L (ref 134–144)
Total Protein: 5.8 g/dL — ABNORMAL LOW (ref 6.0–8.5)
eGFR: 60 mL/min/{1.73_m2} (ref >59)

## 2021-10-01 LAB — LIPID PANEL
Chol/HDL Ratio: 3.3 ratio (ref 0.0–5.0)
Cholesterol, Total: 121 mg/dL (ref 100–199)
HDL: 37 mg/dL — ABNORMAL LOW (ref >39)
LDL Chol Calc (NIH): 56 mg/dL (ref 0–99)
Triglycerides: 168 mg/dL — ABNORMAL HIGH (ref 0–149)
VLDL Cholesterol Cal: 28 mg/dL (ref 5–40)

## 2021-10-01 LAB — MICROALBUMIN / CREATININE URINE RATIO
Creatinine, Urine: 229.8 mg/dL
Microalb/Creat Ratio: 4 mg/g creat (ref 0–29)
Microalbumin, Urine: 9.5 ug/mL

## 2021-10-01 LAB — HEMOGLOBIN A1C
Est. average glucose Bld gHb Est-mCnc: 183 mg/dL
Hgb A1c MFr Bld: 8 % — ABNORMAL HIGH (ref 4.8–5.6)

## 2021-10-02 ENCOUNTER — Other Ambulatory Visit: Payer: Self-pay | Admitting: Family Medicine

## 2021-10-02 DIAGNOSIS — E1165 Type 2 diabetes mellitus with hyperglycemia: Secondary | ICD-10-CM

## 2021-10-03 ENCOUNTER — Other Ambulatory Visit: Payer: Self-pay | Admitting: Family Medicine

## 2021-10-03 DIAGNOSIS — E119 Type 2 diabetes mellitus without complications: Secondary | ICD-10-CM

## 2021-10-03 MED ORDER — ONETOUCH ULTRA VI STRP: ORAL_STRIP | 3 refills | Status: DC

## 2021-10-03 NOTE — Telephone Encounter (Signed)
Copied from [place] [id number]. Topic: Quick Communication - Rx Refill/Question >> Oct 03, 2021  9:20 AM Tessa Lerner A wrote: Medication: Ivan Kennedy test strip [[id number]]   Has the patient contacted their pharmacy? Yes.  The patient has been directed to contact their PCP. The patient's pharmacy has shared with the patient that they have reached out to the practice previously regarding this refill  (Agent: If no, request that the patient contact the pharmacy for the refill. If patient does not wish to contact the pharmacy document the reason why and proceed with request.) (Agent: If yes, when and what did the pharmacy advise?)  Preferred Pharmacy (with phone number or street name): [facility] [address], [place] [address] [place] [address] Phone: [phone] Fax: [phone] Hours: Not open 24 hours  Has the patient been seen for an appointment in the last year OR does the patient have an upcoming appointment? Yes.    Agent: Please be advised that RX refills may take up to 3 business days. We ask that you follow-up with your pharmacy.

## 2021-10-09 ENCOUNTER — Other Ambulatory Visit: Payer: Self-pay | Admitting: Family Medicine

## 2021-10-09 DIAGNOSIS — I10 Essential (primary) hypertension: Secondary | ICD-10-CM

## 2021-10-19 DIAGNOSIS — G4733 Obstructive sleep apnea (adult) (pediatric): Secondary | ICD-10-CM | POA: Diagnosis not present

## 2021-10-20 ENCOUNTER — Telehealth: Payer: Self-pay

## 2021-10-20 NOTE — Progress Notes (Signed)
Per Clinical pharmacist, please reschedule patient telephone appointment on 10/21/2021 to July. ? ?Patient states he would like to un enroll with CCM service  as he feels his PCP is helping him with his medications. Patient states he appreciate all we have done, but feels he no longer needs the service.  Patient states he will reach out in the future if he needs anything. Notified Clinical Pharmacist.  ? ? ?Bessie Kellihan,CPA ?Clinical Pharmacist Assistant ?[phone]  ? ?

## 2021-10-21 ENCOUNTER — Telehealth: Payer: PPO

## 2021-10-21 DIAGNOSIS — H02052 Trichiasis without entropian right lower eyelid: Secondary | ICD-10-CM | POA: Diagnosis not present

## 2021-11-14 ENCOUNTER — Telehealth: Payer: PPO

## 2021-11-24 DIAGNOSIS — H04123 Dry eye syndrome of bilateral lacrimal glands: Secondary | ICD-10-CM | POA: Diagnosis not present

## 2021-12-03 ENCOUNTER — Other Ambulatory Visit: Payer: Self-pay | Admitting: Family Medicine

## 2021-12-03 DIAGNOSIS — E1165 Type 2 diabetes mellitus with hyperglycemia: Secondary | ICD-10-CM

## 2021-12-05 NOTE — Telephone Encounter (Signed)
Requested Prescriptions  ?Pending Prescriptions Disp Refills  ?? pioglitazone (ACTOS) 45 MG tablet [Pharmacy Med Name: PIOGLITAZONE HCL 45 MG TABLET] 90 tablet 1  ?  Sig: TAKE 1 TABLET BY MOUTH EVERY DAY  ?  ? Endocrinology:  Diabetes - Glitazones - pioglitazone Failed - 12/03/2021  1:26 AM  ?  ?  Failed - HBA1C is between 0 and 7.9 and within 180 days  ?  Hgb A1c MFr Bld  ?Date Value Ref Range Status  ?09/30/2021 8.0 (H) 4.8 - 5.6 % Final  ?  Comment:  ?           Prediabetes: 5.7 - 6.4 ?         Diabetes: >6.4 ?         Glycemic control for adults with diabetes: <7.0 ?  ?   ?  ?  Passed - Valid encounter within last 6 months  ?  Recent Outpatient Visits   ?      ? 2 months ago Type 2 diabetes mellitus with other specified complication, without long-term current use of insulin ([place])  ? [facility] [place], Dionne Bucy, MD  ? 5 months ago Hypertension associated with diabetes [facility])  ? [place], Dionne Bucy, MD  ? 8 months ago Type 2 diabetes mellitus with hyperglycemia, without long-term current use of insulin ([place])  ? [facility], Dionne Bucy, MD  ? 9 months ago Hypertension associated with diabetes [facility])  ? [facility] [place], Dionne Bucy, MD  ? 11 months ago Encounter for annual wellness visit (AWV) in Medicare patient  ? [facility] [place], Dionne Bucy, MD  ?  ?  ?Future Appointments   ?        ? In 3 weeks  [facility], [facility]  ? In 4 weeks Bac[place], Dionne Bucy, MD [facility], [facility]  ?  ? ?  ?  ?  ? ?

## 2021-12-17 ENCOUNTER — Other Ambulatory Visit: Payer: Self-pay | Admitting: Family Medicine

## 2021-12-17 DIAGNOSIS — E7800 Pure hypercholesterolemia, unspecified: Secondary | ICD-10-CM

## 2021-12-18 ENCOUNTER — Other Ambulatory Visit: Payer: Self-pay | Admitting: Physician Assistant

## 2021-12-18 DIAGNOSIS — L719 Rosacea, unspecified: Secondary | ICD-10-CM

## 2021-12-19 NOTE — Telephone Encounter (Signed)
Requested Prescriptions  ?Pending Prescriptions Disp Refills  ?? simvastatin (ZOCOR) 20 MG tablet [Pharmacy Med Name: SIMVASTATIN 20 MG TABLET] 90 tablet 1  ?  Sig: TAKE 1 TABLET BY MOUTH EVERY DAY IN THE EVENING  ?  ? Cardiovascular:  Antilipid - Statins Failed - 12/17/2021  1:25 AM  ?  ?  Failed - Lipid Panel in normal range within the last 12 months  ?  Cholesterol, Total  ?Date Value Ref Range Status  ?09/30/2021 121 100 - 199 mg/dL Final  ? ?LDL Cholesterol (Calc)  ?Date Value Ref Range Status  ?04/26/2017 38 mg/dL (calc) Final  ?  Comment:  ?  Reference range: <100 ?Marland Kitchen ?Desirable range <100 mg/dL for primary prevention;   ?<70 mg/dL for patients with CHD or diabetic patients  ?with > or = 2 CHD risk factors. ?. ?LDL-C is now calculated using the Martin-Hopkins  ?calculation, which is a validated novel method providing  ?better accuracy than the Friedewald equation in the  ?estimation of LDL-C.  ?Cresenciano Genre et al. Annamaria Helling. 0240;973(53): 2061-2068  ?(http://education.[employer].com/faq/FAQ164) ?  ? ?LDL Chol Calc (NIH)  ?Date Value Ref Range Status  ?09/30/2021 56 0 - 99 mg/dL Final  ? ?HDL  ?Date Value Ref Range Status  ?09/30/2021 37 (L) >39 mg/dL Final  ? ?Triglycerides  ?Date Value Ref Range Status  ?09/30/2021 168 (H) 0 - 149 mg/dL Final  ? ?  ?  ?  Passed - Patient is not pregnant  ?  ?  Passed - Valid encounter within last 12 months  ?  Recent Outpatient Visits   ?      ? 2 months ago Type 2 diabetes mellitus with other specified complication, without long-term current use of insulin ([place])  ? [facility] [place], Dionne Bucy, MD  ? 5 months ago Hypertension associated with diabetes [facility])  ? [facility] [place], Dionne Bucy, MD  ? 9 months ago Type 2 diabetes mellitus with hyperglycemia, without long-term current use of insulin ([place])  ? [facility] [place], Dionne Bucy, MD  ? 9 months ago Hypertension associated with diabetes [facility])  ? [facility]  [place], Dionne Bucy, MD  ? 12 months ago Encounter for annual wellness visit (AWV) in Medicare patient  ? [facility] [place], Dionne Bucy, MD  ?  ?  ?Future Appointments   ?        ? In 1 week  [facility], [facility]  ? In 2 weeks [place], Dionne Bucy, MD [facility], [facility]  ?  ? ?  ?  ?  ? ?

## 2021-12-27 ENCOUNTER — Ambulatory Visit (INDEPENDENT_AMBULATORY_CARE_PROVIDER_SITE_OTHER): Payer: PPO

## 2021-12-27 VITALS — Wt 243.0 lb

## 2021-12-27 DIAGNOSIS — H04222 Epiphora due to insufficient drainage, left lacrimal gland: Secondary | ICD-10-CM | POA: Diagnosis not present

## 2021-12-27 DIAGNOSIS — Z0000 Encounter for general adult medical examination without abnormal findings: Secondary | ICD-10-CM | POA: Diagnosis not present

## 2021-12-27 DIAGNOSIS — H04221 Epiphora due to insufficient drainage, right lacrimal gland: Secondary | ICD-10-CM | POA: Diagnosis not present

## 2021-12-27 NOTE — Progress Notes (Signed)
?Virtual Visit via Telephone Note ? ?I connected with  Ivan Kennedy on 12/27/21 at  8:15 AM EDT by telephone and verified that I am speaking with the correct person using two identifiers. ? ?Location: ?Patient: home ?Provider: [facility] ?Persons participating in the virtual visit: patient/Nurse Health Advisor ?  ?I discussed the limitations, risks, security and privacy concerns of performing an evaluation and management service by telephone and the availability of in person appointments. The patient expressed understanding and agreed to proceed. ? ?Interactive audio and video telecommunications were attempted between this nurse and patient, however failed, due to patient having technical difficulties OR patient did not have access to video capability.  We continued and completed visit with audio only. ? ?Some vital signs may be absent or patient reported.  ? ?Dionisio David, LPN ? ?Subjective:  ? Ivan Kennedy is a 76 y.o. male who presents for Medicare Annual/Subsequent preventive examination. ? ?Review of Systems    ? ?  ? ?   ?Objective:  ?  ?There were no vitals filed for this visit. ?There is no height or weight on file to calculate BMI. ? ? ?  09/02/2021  ?  8:23 AM 06/08/2021  ?  8:19 AM 01/19/2020  ? 10:04 AM 10/23/2019  ?  8:48 AM 10/17/2018  ? 10:08 AM 09/27/2017  ?  1:18 PM 10/11/2016  ?  8:57 AM  ?Advanced Directives  ?Does Patient Have a Medical Advance Directive? Yes Yes No Yes Yes Yes Yes  ?Type of Paramedic of [place];Living will [place];Living will  [place];Living will Living will;Healthcare Power of [place]  ?Does patient want to make changes to medical advance directive? No - Patient declined No - Patient declined       ?Copy of [place] in Chart? Yes - validated most recent copy scanned in chart (See row information) Yes - validated most recent copy  scanned in chart (See row information)  No - copy requested No - copy requested No - copy requested No - copy requested  ? ? ?Current Medications (verified) ?Outpatient Encounter Medications as of 12/27/2021  ?Medication Sig  ? aspirin EC 81 MG tablet Take 81 mg by mouth daily.   ? Blood Glucose Monitoring Suppl (ONE TOUCH ULTRA 2) w/Device KIT To check blood sugar once daily  ? doxycycline (VIBRAMYCIN) 50 MG capsule Take 50 mg by mouth 2 (two) times daily.  ? empagliflozin (JARDIANCE) 25 MG TABS tablet Take 1 tablet (25 mg total) by mouth daily before breakfast.  ? glucose blood (ONETOUCH ULTRA) test strip CHECK BLOOD SUGAR ONCE DAILY  ? hydrochlorothiazide (MICROZIDE) 12.5 MG capsule Take 12.5 mg by mouth daily.  ? Lancets (ACCU-CHEK SOFT TOUCH) lancets To check blood glucose daily  ? loratadine (CLARITIN) 10 MG tablet Take 10 mg by mouth daily.  ? losartan (COZAAR) 100 MG tablet TAKE 1 TABLET BY MOUTH EVERY DAY  ? metoprolol succinate (TOPROL-XL) 25 MG 24 hr tablet TAKE 1 TABLET (25 MG TOTAL) BY MOUTH DAILY.  ? metroNIDAZOLE (METROGEL) 1 % gel Apply 1 application topically every morning.  ? omeprazole (PRILOSEC) 40 MG capsule TAKE 1 CAPSULE BY MOUTH EVERY DAY  ? ONETOUCH ULTRA test strip CHECK BLOOD SUGAR ONCE DAILY  ? pioglitazone (ACTOS) 45 MG tablet TAKE 1 TABLET BY MOUTH EVERY DAY  ? RYBELSUS 14 MG TABS TAKE 14 MG (1 TABLET) BY MOUTH DAILY  BEFORE BREAKFAST. TAKE ON AN EMPTY STOMACH WITH A SIP OF WATER  ? simvastatin (ZOCOR) 20 MG tablet TAKE 1 TABLET BY MOUTH EVERY DAY IN THE EVENING  ? tretinoin (RETIN-A) 0.05 % cream APPLY 1 APPLICATION TOPICALLY AT BEDTIME.  ? vitamin B-12 (CYANOCOBALAMIN) 1000 MCG tablet Take 1,000 mcg by mouth daily.  ? ?No facility-administered encounter medications on file as of 12/27/2021.  ? ? ?Allergies (verified) ?Patient has no known allergies.  ? ?History: ?Past Medical History:  ?Diagnosis Date  ? Colon polyp 2005  ? Diabetes mellitus ([place])   ? GERD (gastroesophageal reflux  disease)   ? Hyperlipidemia   ? Hypertension   ? Rosacea   ? Sleep apnea   ? CPAP  ? ?Past Surgical History:  ?Procedure Laterality Date  ? [place]  ? BROW LIFT Bilateral 09/02/2021  ? Procedure: BLEPHAROPLASTY UPPER EYELID; W/EXCESS SKIN ECTROPION REPAIR, EXTENSIVE WEDGE EXCISION, UPPER BILATERAL;  Surgeon: Karle Starch, MD;  Location: [place];  Service: Ophthalmology;  Laterality: Bilateral;  Diabetic  ?sleep apnea  ? CATARACT EXTRACTION W/PHACO Right 05/25/2021  ? Procedure: CATARACT EXTRACTION PHACO AND INTRAOCULAR LENS PLACEMENT ([facility]) RIGHT DIABETIC 6.64 00:54.7;  Surgeon: Leandrew Koyanagi, MD;  Location: [place];  Service: Ophthalmology;  Laterality: Right;  Diabetic, sleep apnea  ? CATARACT EXTRACTION W/PHACO Left 06/08/2021  ? Procedure: CATARACT EXTRACTION PHACO AND INTRAOCULAR LENS PLACEMENT ([place]) LEFT DIABETIC;  Surgeon: Leandrew Koyanagi, MD;  Location: [place];  Service: Ophthalmology;  Laterality: Left;  Diabetic, sleep apnea ?7.57 ?01:02.2  ? COLONOSCOPY  2005, 2010  ? COLONOSCOPY WITH PROPOFOL N/A 01/19/2020  ? Procedure: COLONOSCOPY WITH PROPOFOL;  Surgeon: Jonathon Bellows, MD;  [facility] END[facility]COPY;  Service: Gastroenterology;  Laterality: N/A;  ? 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 Dr. Earnestine Leys  ? shoulder spurs Right 2000  ? ?Family History  ?Problem Relation Age of Onset  ? Diabetes Mother   ? ?Social History  ? ?Socioeconomic History  ? Marital status: Married  ?  Spouse name: Not on file  ? Number of children: 1  ? Years of education: Not on file  ? Highest education level: GED or equivalent  ?Occupational History  ? Occupation: retired  ?Tobacco Use  ? Smoking status: Never  ? Smokeless tobacco: Never  ?Vaping Use  ? Vaping Use: Never used  ?Substance and Sexual Activity  ? Alcohol use: Yes  ?  Alcohol/week: 0.0 standard drinks  ?  Comment: occasionally- beer 1-2/month  ? Drug use: No  ? Sexual activity: Not on file  ?Other  Topics Concern  ? Not on file  ?Social History Narrative  ? Not on file  ? ?Social Determinants of Health  ? ?Financial Resource Strain: Low Risk   ? Difficulty of Paying Living Expenses: Not hard at all  ?Food Insecurity: Not on file  ?Transportation Needs: Not on file  ?Physical Activity: Not on file  ?Stress: Not on file  ?Social Connections: Not on file  ? ? ?Tobacco Counseling ?Counseling given: Not Answered ? ? ?Clinical Intake: ? ?Pre-visit preparation completed: Yes ? ?Pain : No/denies pain ? ?  ? ?Nutritional Risks: Other (Comment) ?Diabetes: Yes ?CBG done?: No ?Did pt. bring in CBG monitor from home?: No ? ?How often do you need to have someone help you when you read instructions, pamphlets, or other written materials from your doctor or pharmacy?: 1 - Never ? ?Diabetic?yes ?Nutrition Risk Assessment: ? ?Has the patient had any  N/V/D within the last 2 months?  No  ?Does the patient have any non-healing wounds?  No  ?Has the patient had any unintentional weight loss or weight gain?  No  ? ?Diabetes: ? ?Is the patient diabetic?  Yes  ?If diabetic, was a CBG obtained today?  No  ?Did the patient bring in their glucometer from home?  No  ?How often do you monitor your CBG's? Every day ? ?Financial Strains and Diabetes Management: ? ?Are you having any financial strains with the device, your supplies or your medication? No .  ?Does the patient want to be seen by Chronic Care Management for management of their diabetes?  No  ?Would the patient like to be referred to a Nutritionist or for Diabetic Management?  No  ? ?Diabetic Exams: ? ?Diabetic Eye Exam: Completed 02/24/21.  Pt has been advised about the importance in completing this exam.  ?Diabetic Foot Exam: Completed 09/30/21. Pt has been advised about the importance in completing this exam.   ? ? ?Interpreter Needed?: No ? ?Information entered by :: Kirke Shaggy, LPN ? ? ?Activities of Daily Living ? ?  09/30/2021  ?  8:20 AM 09/02/2021  ?  8:13 AM  ?In your  present state of health, do you have any difficulty performing the following activities:  ?Hearing? 0 0  ?Vision? 1 0  ?Difficulty concentrating or making decisions? 0 0  ?Walking or climbing stairs? 0 0  ?Dr

## 2021-12-27 NOTE — Patient Instructions (Signed)
Ivan Kennedy , ?Thank you for taking time to come for your Medicare Wellness Visit. I appreciate your ongoing commitment to your health goals. Please review the following plan we discussed and let me know if I can assist you in the future.  ? ?Screening recommendations/referrals: ?Colonoscopy: 01/19/20 ?Recommended yearly ophthalmology/optometry visit for glaucoma screening and checkup ?Recommended yearly dental visit for hygiene and checkup ? ?Vaccinations: ?Influenza vaccine: 06/28/21 ?Pneumococcal vaccine: 10/30/18 ?Tdap vaccine: n/d ?Shingles vaccine: n/d   ?Covid-19: 09/29/19, 10/28/19 ? ?Advanced directives: no ? ?Conditions/risks identified: none ? ?Next appointment: Follow up in one year for your annual wellness visit. 01/01/23 @ 8:15am by phone ? ?Preventive Care 74 Years and Older, Male ?Preventive care refers to lifestyle choices and visits with your health care provider that can promote health and wellness. ?What does preventive care include? ?A yearly physical exam. This is also called an annual well check. ?Dental exams once or twice a year. ?Routine eye exams. Ask your health care provider how often you should have your eyes checked. ?Personal lifestyle choices, including: ?Daily care of your teeth and gums. ?Regular physical activity. ?Eating a healthy diet. ?Avoiding tobacco and drug use. ?Limiting alcohol use. ?Practicing safe sex. ?Taking low doses of aspirin every day. ?Taking vitamin and mineral supplements as recommended by your health care provider. ?What happens during an annual well check? ?The services and screenings done by your health care provider during your annual well check will depend on your age, overall health, lifestyle risk factors, and family history of disease. ?Counseling  ?Your health care provider may ask you questions about your: ?Alcohol use. ?Tobacco use. ?Drug use. ?Emotional well-being. ?Home and relationship well-being. ?Sexual activity. ?Eating habits. ?History of  falls. ?Memory and ability to understand (cognition). ?Work and work Statistician. ?Screening  ?You may have the following tests or measurements: ?Height, weight, and BMI. ?Blood pressure. ?Lipid and cholesterol levels. These may be checked every 5 years, or more frequently if you are over 45 years old. ?Skin check. ?Lung cancer screening. You may have this screening every year starting at age 74 if you have a 30-pack-year history of smoking and currently smoke or have quit within the past 15 years. ?Fecal occult blood test (FOBT) of the stool. You may have this test every year starting at age 74. ?Flexible sigmoidoscopy or colonoscopy. You may have a sigmoidoscopy every 5 years or a colonoscopy every 10 years starting at age 53. ?Prostate cancer screening. Recommendations will vary depending on your family history and other risks. ?Hepatitis C blood test. ?Hepatitis B blood test. ?Sexually transmitted disease (STD) testing. ?Diabetes screening. This is done by checking your blood sugar (glucose) after you have not eaten for a while (fasting). You may have this done every 1-3 years. ?Abdominal aortic aneurysm (AAA) screening. You may need this if you are a current or former smoker. ?Osteoporosis. You may be screened starting at age 61 if you are at high risk. ?Talk with your health care provider about your test results, treatment options, and if necessary, the need for more tests. ?Vaccines  ?Your health care provider may recommend certain vaccines, such as: ?Influenza vaccine. This is recommended every year. ?Tetanus, diphtheria, and acellular pertussis (Tdap, Td) vaccine. You may need a Td booster every 10 years. ?Zoster vaccine. You may need this after age 59. ?Pneumococcal 13-valent conjugate (PCV13) vaccine. One dose is recommended after age 5. ?Pneumococcal polysaccharide (PPSV23) vaccine. One dose is recommended after age 55. ?Talk to your health care provider about  which screenings and vaccines you need and  how often you need them. ?This information is not intended to replace advice given to you by your health care provider. Make sure you discuss any questions you have with your health care provider. ?Document Released: 08/27/2015 Document Revised: 04/19/2016 Document Reviewed: 06/01/2015 ?Elsevier Interactive Patient Education ? 2017 [place]. ? ?Fall Prevention in the Home ?Falls can cause injuries. They can happen to people of all ages. There are many things you can do to make your home safe and to help prevent falls. ?What can I do on the outside of my home? ?Regularly fix the edges of walkways and driveways and fix any cracks. ?Remove anything that might make you trip as you walk through a door, such as a raised step or threshold. ?Trim any bushes or trees on the path to your home. ?Use bright outdoor lighting. ?Clear any walking paths of anything that might make someone trip, such as rocks or tools. ?Regularly check to see if handrails are loose or broken. Make sure that both sides of any steps have handrails. ?Any raised decks and porches should have guardrails on the edges. ?Have any leaves, snow, or ice cleared regularly. ?Use sand or salt on walking paths during winter. ?Clean up any spills in your garage right away. This includes oil or grease spills. ?What can I do in the bathroom? ?Use night lights. ?Install grab bars by the toilet and in the tub and shower. Do not use towel bars as grab bars. ?Use non-skid mats or decals in the tub or shower. ?If you need to sit down in the shower, use a plastic, non-slip stool. ?Keep the floor dry. Clean up any water that spills on the floor as soon as it happens. ?Remove soap buildup in the tub or shower regularly. ?Attach bath mats securely with double-sided non-slip rug tape. ?Do not have throw rugs and other things on the floor that can make you trip. ?What can I do in the bedroom? ?Use night lights. ?Make sure that you have a light by your bed that is easy to  reach. ?Do not use any sheets or blankets that are too big for your bed. They should not hang down onto the floor. ?Have a firm chair that has side arms. You can use this for support while you get dressed. ?Do not have throw rugs and other things on the floor that can make you trip. ?What can I do in the kitchen? ?Clean up any spills right away. ?Avoid walking on wet floors. ?Keep items that you use a lot in easy-to-reach places. ?If you need to reach something above you, use a strong step stool that has a grab bar. ?Keep electrical cords out of the way. ?Do not use floor polish or wax that makes floors slippery. If you must use wax, use non-skid floor wax. ?Do not have throw rugs and other things on the floor that can make you trip. ?What can I do with my stairs? ?Do not leave any items on the stairs. ?Make sure that there are handrails on both sides of the stairs and use them. Fix handrails that are broken or loose. Make sure that handrails are as long as the stairways. ?Check any carpeting to make sure that it is firmly attached to the stairs. Fix any carpet that is loose or worn. ?Avoid having throw rugs at the top or bottom of the stairs. If you do have throw rugs, attach them to the floor with  carpet tape. ?Make sure that you have a light switch at the top of the stairs and the bottom of the stairs. If you do not have them, ask someone to add them for you. ?What else can I do to help prevent falls? ?Wear shoes that: ?Do not have high heels. ?Have rubber bottoms. ?Are comfortable and fit you well. ?Are closed at the toe. Do not wear sandals. ?If you use a stepladder: ?Make sure that it is fully opened. Do not climb a closed stepladder. ?Make sure that both sides of the stepladder are locked into place. ?Ask someone to hold it for you, if possible. ?Clearly mark and make sure that you can see: ?Any grab bars or handrails. ?First and last steps. ?Where the edge of each step is. ?Use tools that help you move  around (mobility aids) if they are needed. These include: ?Canes. ?Walkers. ?Scooters. ?Crutches. ?Turn on the lights when you go into a dark area. Replace any light bulbs as soon as they burn out. ?Set up your furnitu

## 2021-12-28 NOTE — Progress Notes (Signed)
I,Sulibeya S Dimas,acting as a scribe for Lavon Paganini, MD.,have documented all relevant documentation on the behalf of Lavon Paganini, MD,as directed by  Lavon Paganini, MD while in the presence of Lavon Paganini, MD.   Established patient visit   Patient: Ivan Kennedy   DOB: 1945/12/13   76 y.o. Male  MRN: [id number] Visit Date: 01/02/2022  Today's healthcare provider: Lavon Paganini, MD   Chief Complaint  Patient presents with   Diabetes   Hypertension   Subjective    HPI  Diabetes Mellitus Type II, follow-up  Lab Results  Component Value Date   HGBA1C 6.7 (A) 01/02/2022   HGBA1C 8.0 (H) 09/30/2021   HGBA1C 6.4 (A) 06/28/2021   Last seen for diabetes 3 months ago.  Management since then includes  Stop glimepiride and add Jardiance. Continue Rybelsus. Will attempt to get off Actos in the future also.  He reports excellent compliance with treatment. He is not having side effects.   Home blood sugar records: fasting range: 130-160  Episodes of hypoglycemia? No    Current insulin regiment: none Most Recent Eye Exam: UTD  --------------------------------------------------------------------------------------------------- Hypertension, follow-up  BP Readings from Last 3 Encounters:  01/02/22 130/85  09/30/21 117/72  09/02/21 119/78   Wt Readings from Last 3 Encounters:  01/02/22 233 lb (105.7 kg)  12/27/21 243 lb (110.2 kg)  09/30/21 243 lb (110.2 kg)     He was last seen for hypertension 3 months ago.  BP at that visit was 117/72. Management since that visit includes no changes. He reports excellent compliance with treatment. He is not having side effects.  He is exercising. He is adherent to low salt diet.   Outside blood pressures are stable 120/80.  He does not smoke.  Use of agents associated with hypertension: none.    --------------------------------------------------------------------------------------------------- Lipid/Cholesterol, Follow-up  Last lipid panel Other pertinent labs  Lab Results  Component Value Date   CHOL 121 09/30/2021   HDL 37 (L) 09/30/2021   LDLCALC 56 09/30/2021   TRIG 168 (H) 09/30/2021   CHOLHDL 3.3 09/30/2021   Lab Results  Component Value Date   ALT 20 09/30/2021   AST 20 09/30/2021   PLT 172 11/06/2019   TSH 1.650 10/30/2018     He was last seen for this 3 months ago.  Management since that visit includes no changes.  He reports excellent compliance with treatment. He is not having side effects.   Symptoms: No chest pain No chest pressure/discomfort  No dyspnea No lower extremity edema  No numbness or tingling of extremity No orthopnea  No palpitations No paroxysmal nocturnal dyspnea  No speech difficulty No syncope   Current diet: in general, a "healthy" diet   Current exercise: walking  The ASCVD Risk score (Arnett DK, et al., 2019) failed to calculate for the following reasons:   The valid total cholesterol range is 130 to 320 mg/dL  ---------------------------------------------------------------------------------------------------  Medications: Outpatient Medications Prior to Visit  Medication Sig   ARTIFICIAL TEARS 1 % ophthalmic solution    aspirin EC 81 MG tablet Take 81 mg by mouth daily.    Azelaic Acid 15 % gel Apply topically every morning.   Blood Glucose Monitoring Suppl (ONE TOUCH ULTRA 2) w/Device KIT To check blood sugar once daily   doxycycline (VIBRAMYCIN) 50 MG capsule Take 50 mg by mouth 2 (two) times daily.   empagliflozin (JARDIANCE) 25 MG TABS tablet Take 1 tablet (25 mg total) by mouth daily before breakfast.  glucose blood (ONETOUCH ULTRA) test strip CHECK BLOOD SUGAR ONCE DAILY   Lancets (ACCU-CHEK SOFT TOUCH) lancets To check blood glucose daily   loratadine (CLARITIN) 10 MG tablet Take 10 mg by mouth daily.   losartan  (COZAAR) 100 MG tablet TAKE 1 TABLET BY MOUTH EVERY DAY   metoprolol succinate (TOPROL-XL) 25 MG 24 hr tablet TAKE 1 TABLET (25 MG TOTAL) BY MOUTH DAILY.   metroNIDAZOLE (METROGEL) 1 % gel Apply 1 application topically every morning.   omeprazole (PRILOSEC) 40 MG capsule TAKE 1 CAPSULE BY MOUTH EVERY DAY   ONETOUCH ULTRA test strip CHECK BLOOD SUGAR ONCE DAILY   pioglitazone (ACTOS) 45 MG tablet TAKE 1 TABLET BY MOUTH EVERY DAY   POLYTRIM ophthalmic solution 1 drop 4 (four) times daily.   RYBELSUS 14 MG TABS TAKE 14 MG (1 TABLET) BY MOUTH DAILY BEFORE BREAKFAST. TAKE ON AN EMPTY STOMACH WITH A SIP OF WATER   simvastatin (ZOCOR) 20 MG tablet TAKE 1 TABLET BY MOUTH EVERY DAY IN THE EVENING   tretinoin (RETIN-A) 0.05 % cream APPLY 1 APPLICATION TOPICALLY AT BEDTIME.   vitamin B-12 (CYANOCOBALAMIN) 1000 MCG tablet Take 1,000 mcg by mouth daily.   [DISCONTINUED] hydrochlorothiazide (MICROZIDE) 12.5 MG capsule Take 12.5 mg by mouth daily. (Patient not taking: Reported on 01/02/2022)   [DISCONTINUED] neomycin-polymyxin b-dexamethasone (MAXITROL) 3.5-10000-0.1 OINT SMARTSIG:1 sparingly In Eye(s) Every Night   No facility-administered medications prior to visit.    Review of Systems  Constitutional:  Negative for activity change, appetite change and fatigue.  Respiratory:  Negative for chest tightness and shortness of breath.   Cardiovascular:  Negative for chest pain, palpitations and leg swelling.       Objective    BP 130/85 Comment: home reading  Pulse 67   Temp 97.6 F (36.4 C) (Oral)   Wt 233 lb (105.7 kg)   HC 16" (40.6 cm)   BMI 30.74 kg/m  BP Readings from Last 3 Encounters:  01/02/22 130/85  09/30/21 117/72  09/02/21 119/78   Wt Readings from Last 3 Encounters:  01/02/22 233 lb (105.7 kg)  12/27/21 243 lb (110.2 kg)  09/30/21 243 lb (110.2 kg)      Physical Exam Vitals reviewed.  Constitutional:      General: He is not in acute distress.    Appearance: Normal  appearance. He is not diaphoretic.  HENT:     Head: Normocephalic and atraumatic.  Eyes:     General: No scleral icterus.    Conjunctiva/sclera: Conjunctivae normal.  Cardiovascular:     Rate and Rhythm: Normal rate and regular rhythm.     Pulses: Normal pulses.     Heart sounds: Normal heart sounds. No murmur heard. Pulmonary:     Effort: Pulmonary effort is normal. No respiratory distress.     Breath sounds: Normal breath sounds. No wheezing or rhonchi.  Abdominal:     General: There is no distension.     Palpations: Abdomen is soft.     Tenderness: There is no abdominal tenderness.  Musculoskeletal:     Cervical back: Neck supple.     Right lower leg: No edema.     Left lower leg: No edema.  Lymphadenopathy:     Cervical: No cervical adenopathy.  Skin:    General: Skin is warm and dry.     Capillary Refill: Capillary refill takes less than 2 seconds.     Findings: No rash.  Neurological:     Mental Status: He is alert and  oriented to person, place, and time.     Cranial Nerves: No cranial nerve deficit.  Psychiatric:        Mood and Affect: Mood normal.        Behavior: Behavior normal.     Results for orders placed or performed in visit on 01/02/22  POCT glycosylated hemoglobin (Hb A1C)  Result Value Ref Range   Hemoglobin A1C 6.7 (A) 4.0 - 5.6 %   Est. average glucose Bld gHb Est-mCnc 146     Assessment & Plan     Problem List Items Addressed This Visit       Cardiovascular and Mediastinum   Hypertension associated with diabetes ([place]) - Primary    BP slightly more elevated than typical  Had not resumed hctz in Nov as Rx'd - will resume today No other changes Reviewed last CMP       Relevant Medications   hydrochlorothiazide (MICROZIDE) 12.5 MG capsule   Other Relevant Orders   POCT glycosylated hemoglobin (Hb A1C) (Completed)     Endocrine   Diabetes mellitus, type 2 ([place])    Well controlled  Assoc with HLD and HTN Continue current medications On  ACEi/ARB On Statin Discussed diet and exercise F/u in 6 months       Hyperlipidemia associated with type 2 diabetes mellitus ([place])    Previously well controlled Continue statin Repeat FLP and CMP annually Goal LDL < 70       Relevant Medications   hydrochlorothiazide (MICROZIDE) 12.5 MG capsule     Other   Obesity    Discussed importance of healthy weight management Discussed diet and exercise         Return in about 6 months (around 07/05/2022) for CPE.      I, Lavon Paganini, MD, have reviewed all documentation for this visit. The documentation on 01/02/22 for the exam, diagnosis, procedures, and orders are all accurate and complete.   Bacigalupo, Dionne Bucy, MD, MPH [facility]

## 2022-01-02 ENCOUNTER — Ambulatory Visit (INDEPENDENT_AMBULATORY_CARE_PROVIDER_SITE_OTHER): Payer: PPO | Admitting: Family Medicine

## 2022-01-02 ENCOUNTER — Encounter: Payer: Self-pay | Admitting: Family Medicine

## 2022-01-02 VITALS — BP 130/85 | HR 67 | Temp 97.6°F | Wt 233.0 lb

## 2022-01-02 DIAGNOSIS — E669 Obesity, unspecified: Secondary | ICD-10-CM | POA: Diagnosis not present

## 2022-01-02 DIAGNOSIS — E1169 Type 2 diabetes mellitus with other specified complication: Secondary | ICD-10-CM

## 2022-01-02 DIAGNOSIS — I152 Hypertension secondary to endocrine disorders: Secondary | ICD-10-CM | POA: Diagnosis not present

## 2022-01-02 DIAGNOSIS — Z6830 Body mass index (BMI) 30.0-30.9, adult: Secondary | ICD-10-CM | POA: Diagnosis not present

## 2022-01-02 DIAGNOSIS — E785 Hyperlipidemia, unspecified: Secondary | ICD-10-CM

## 2022-01-02 DIAGNOSIS — E1159 Type 2 diabetes mellitus with other circulatory complications: Secondary | ICD-10-CM | POA: Diagnosis not present

## 2022-01-02 LAB — POCT GLYCOSYLATED HEMOGLOBIN (HGB A1C)
Est. average glucose Bld gHb Est-mCnc: 146
Hemoglobin A1C: 6.7 % — AB (ref 4.0–5.6)

## 2022-01-02 MED ORDER — HYDROCHLOROTHIAZIDE 12.5 MG PO CAPS: 12.5000 mg | ORAL_CAPSULE | Freq: Every day | ORAL | 1 refills | Status: DC

## 2022-01-02 NOTE — Assessment & Plan Note (Signed)
Well controlled  Assoc with HLD and HTN Continue current medications On ACEi/ARB On Statin Discussed diet and exercise F/u in 6 months

## 2022-01-02 NOTE — Assessment & Plan Note (Signed)
Previously well controlled Continue statin Repeat FLP and CMP annually Goal LDL < 70  

## 2022-01-02 NOTE — Assessment & Plan Note (Signed)
BP slightly more elevated than typical  Had not resumed hctz in Nov as Rx'd - will resume today No other changes Reviewed last CMP

## 2022-01-02 NOTE — Assessment & Plan Note (Signed)
Discussed importance of healthy weight management Discussed diet and exercise  

## 2022-01-16 ENCOUNTER — Ambulatory Visit: Payer: Self-pay

## 2022-01-16 NOTE — Telephone Encounter (Signed)
Patient advised as below. He reports its his blood sugar that has been elevated since starting HCTZ. Patient will check BP once he gets home. Advised I will call him back later.

## 2022-01-16 NOTE — Telephone Encounter (Signed)
Summary: BP concern / rx concern   The patient has made an additional phone cal requesting to speak with a member of clinical staff   The patient has checked their blood sugar and pressure as directed, at 3 PM   Their Blood sugar was 220  Their sitting bp was 120/66  Their standing bp was 99/67   Please contact the patient further when possible   They are still continuing to experience the lightheadedness from their newly prescribed HCTZ      Answer Assessment - Initial Assessment Questions 1. REASON FOR CALL or QUESTION: "What is your reason for calling today?" or "How can I best help you?" or "What question do you have that I can help answer?"     I returned pt's call regarding PCP's request for blood pressure and blood sugar reading. Pt requests a call back from Dr. Brita Romp or his nurse  Protocols used: Information Only Call - No Triage-A-AH

## 2022-01-16 NOTE — Telephone Encounter (Signed)
Can he check sitting and standing BPs please?  Let's make him a f/u appt this week to recheck.  Starting HCTZ should not have raised his BP as it says in this triage note.  With the dizziness, I am worried for orthostatic hypotension.

## 2022-01-16 NOTE — Telephone Encounter (Signed)
Pt called to report that he has felt dizzy and lightheaded since starting Hydrochlorothiazide  Best contact: [phone]     Chief Complaint: Having dizziness and elevated blood sugar since starting HCTZ Symptoms: Above Frequency: Started when he started medication Pertinent Negatives: Patient denies  Disposition: '[]'$ ED /'[]'$ Urgent Care (no appt availability in office) / '[]'$ Appointment(In office/virtual)/ '[]'$  [facility]/ '[]'$ Home Care/ '[]'$ Refused Recommended Disposition /'[]'$ [facility]/ '[x]'$  Follow-up with PCP Additional Notes: Please advise pt.  Answer Assessment - Initial Assessment Questions 1. NAME of MEDICATION: "What medicine are you calling about?"     Hydrochlorothiazide 2. QUESTION: "What is your question?" (e.g., double dose of medicine, side effect)     Side effect 3. PRESCRIBING HCP: "Who prescribed it?" Reason: if prescribed by specialist, call should be referred to that group.     Dr. Brita Romp 4. SYMPTOMS: "Do you have any symptoms?"     Dizziness and BS has gone up to 210-220 5. SEVERITY: If symptoms are present, ask "Are they mild, moderate or severe?"     Severe - 6. PREGNANCY:  "Is there any chance that you are pregnant?" "When was your last menstrual period?"     N/a  Protocols used: Medication Question Call-A-AH

## 2022-01-17 DIAGNOSIS — H0100A Unspecified blepharitis right eye, upper and lower eyelids: Secondary | ICD-10-CM | POA: Diagnosis not present

## 2022-01-17 DIAGNOSIS — E113293 Type 2 diabetes mellitus with mild nonproliferative diabetic retinopathy without macular edema, bilateral: Secondary | ICD-10-CM | POA: Diagnosis not present

## 2022-01-17 DIAGNOSIS — Z961 Presence of intraocular lens: Secondary | ICD-10-CM | POA: Diagnosis not present

## 2022-01-17 DIAGNOSIS — H04223 Epiphora due to insufficient drainage, bilateral lacrimal glands: Secondary | ICD-10-CM | POA: Diagnosis not present

## 2022-01-17 LAB — HM DIABETES EYE EXAM

## 2022-01-17 NOTE — Telephone Encounter (Signed)
Patient advised as below.  

## 2022-01-17 NOTE — Telephone Encounter (Signed)
Hold HCTZ.  No changes to diabetes meds given that last A1c was well controlled. Monitor home BP and fasting glucose. If not improving with this change, we can see him for a visit.

## 2022-02-09 DIAGNOSIS — Z872 Personal history of diseases of the skin and subcutaneous tissue: Secondary | ICD-10-CM | POA: Diagnosis not present

## 2022-02-09 DIAGNOSIS — Z85828 Personal history of other malignant neoplasm of skin: Secondary | ICD-10-CM | POA: Diagnosis not present

## 2022-02-09 DIAGNOSIS — Z859 Personal history of malignant neoplasm, unspecified: Secondary | ICD-10-CM | POA: Diagnosis not present

## 2022-02-09 DIAGNOSIS — L821 Other seborrheic keratosis: Secondary | ICD-10-CM | POA: Diagnosis not present

## 2022-02-09 DIAGNOSIS — L718 Other rosacea: Secondary | ICD-10-CM | POA: Diagnosis not present

## 2022-02-09 DIAGNOSIS — L578 Other skin changes due to chronic exposure to nonionizing radiation: Secondary | ICD-10-CM | POA: Diagnosis not present

## 2022-02-09 DIAGNOSIS — L570 Actinic keratosis: Secondary | ICD-10-CM | POA: Diagnosis not present

## 2022-03-16 DIAGNOSIS — H02401 Unspecified ptosis of right eyelid: Secondary | ICD-10-CM | POA: Diagnosis not present

## 2022-03-21 DIAGNOSIS — H02401 Unspecified ptosis of right eyelid: Secondary | ICD-10-CM | POA: Diagnosis not present

## 2022-03-23 ENCOUNTER — Other Ambulatory Visit: Payer: Self-pay | Admitting: Family Medicine

## 2022-03-31 ENCOUNTER — Other Ambulatory Visit: Payer: Self-pay | Admitting: Family Medicine

## 2022-03-31 DIAGNOSIS — E1165 Type 2 diabetes mellitus with hyperglycemia: Secondary | ICD-10-CM

## 2022-04-11 ENCOUNTER — Other Ambulatory Visit: Payer: Self-pay | Admitting: Family Medicine

## 2022-04-11 DIAGNOSIS — I10 Essential (primary) hypertension: Secondary | ICD-10-CM

## 2022-04-18 DIAGNOSIS — Z961 Presence of intraocular lens: Secondary | ICD-10-CM | POA: Diagnosis not present

## 2022-05-07 ENCOUNTER — Other Ambulatory Visit: Payer: Self-pay | Admitting: Family Medicine

## 2022-05-07 DIAGNOSIS — K219 Gastro-esophageal reflux disease without esophagitis: Secondary | ICD-10-CM

## 2022-05-29 ENCOUNTER — Encounter: Payer: Self-pay | Admitting: Ophthalmology

## 2022-05-30 ENCOUNTER — Other Ambulatory Visit: Payer: Self-pay | Admitting: Family Medicine

## 2022-05-30 DIAGNOSIS — E1165 Type 2 diabetes mellitus with hyperglycemia: Secondary | ICD-10-CM

## 2022-06-01 NOTE — Anesthesia Preprocedure Evaluation (Addendum)
Anesthesia Evaluation  Patient identified by MRN, date of birth, ID band Patient awake    Reviewed: Allergy & Precautions, NPO status , Patient's Chart, lab work & pertinent test results, reviewed documented beta blocker date and time   Airway Mallampati: III  TM Distance: >3 FB Neck ROM: full    Dental no notable dental hx.    Pulmonary sleep apnea ,    Pulmonary exam normal        Cardiovascular hypertension, Pt. on home beta blockers and Pt. on medications Normal cardiovascular exam     Neuro/Psych negative neurological ROS  negative psych ROS   GI/Hepatic Neg liver ROS, GERD  Controlled and Medicated,  Endo/Other  diabetes, Type 2, Oral Hypoglycemic Agents  Renal/GU negative Renal ROS  negative genitourinary   Musculoskeletal  (+) Arthritis ,   Abdominal Normal abdominal exam  (+)   Peds  Hematology negative hematology ROS (+)   Anesthesia Other Findings Past Medical History: 2005: Colon polyp No date: Diabetes mellitus ([place]) No date: GERD (gastroesophageal reflux disease) No date: Hyperlipidemia No date: Hypertension No date: Rosacea No date: Sleep apnea     Comment:  CPAP  Past Surgical History: 1998: ANAL FISSURE REPAIR 09/02/2021: BROW LIFT; Bilateral     Comment:  Procedure: BLEPHAROPLASTY UPPER EYELID; W/EXCESS SKIN               ECTROPION REPAIR, EXTENSIVE WEDGE EXCISION, UPPER               BILATERAL;  Surgeon: Karle Starch, MD;  Location: [place];  Service: Ophthalmology;  Laterality:               Bilateral;  Diabetic  sleep apnea 05/25/2021: CATARACT EXTRACTION W/PHACO; Right     Comment:  Procedure: CATARACT EXTRACTION PHACO AND INTRAOCULAR               LENS PLACEMENT (IOC) RIGHT DIABETIC 6.64 00:54.7;                Surgeon: Leandrew Koyanagi, MD;  Location: [facility] [facility] [place];  Service: Ophthalmology;  Laterality:               Right;   Diabetic, sleep apnea 06/08/2021: CATARACT EXTRACTION W/PHACO; Left     Comment:  Procedure: CATARACT EXTRACTION PHACO AND INTRAOCULAR               LENS PLACEMENT ([place]) LEFT DIABETIC;  Surgeon: Leandrew Koyanagi, MD;  Location: [place];  Service:               Ophthalmology;  Laterality: Left;  Diabetic, sleep               apnea 7.57 01:02.2 2005, 2010: COLONOSCOPY 01/19/2020: COLONOSCOPY WITH PROPOFOL; N/A     Comment:  Procedure: COLONOSCOPY WITH PROPOFOL;  Surgeon: Jonathon Bellows, MD;  Location[facility];  Service:               Gastroenterology;  Laterality: N/A; 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 Earnestine Leys 2000: shoulder spurs; Right  BMI    Body Mass Index: 30.34 kg/m  Reproductive/Obstetrics negative OB ROS                          Anesthesia Physical Anesthesia Plan  ASA: 2  Anesthesia Plan: MAC   Post-op Pain Management:    Induction:   PONV Risk Score and Plan:   Airway Management Planned:   Additional Equipment:   Intra-op Plan:   Post-operative Plan:   Informed Consent: I have reviewed the patients History and Physical, chart, labs and discussed the procedure including the risks, benefits and alternatives for the proposed anesthesia with the patient or authorized representative who has indicated his/her understanding and acceptance.       Plan Discussed with: Anesthesiologist, CRNA and Surgeon  Anesthesia Plan Comments:        Anesthesia Quick Evaluation

## 2022-06-01 NOTE — Discharge Instructions (Signed)

## 2022-06-02 ENCOUNTER — Encounter: Payer: Self-pay | Admitting: Ophthalmology

## 2022-06-02 ENCOUNTER — Ambulatory Visit: Payer: PPO | Admitting: Anesthesiology

## 2022-06-02 ENCOUNTER — Encounter
Admission: RE | Disposition: A | Discharge status: Home / Self Care | Payer: Self-pay | Source: Ambulatory Visit | Attending: Ophthalmology

## 2022-06-02 ENCOUNTER — Other Ambulatory Visit: Payer: Self-pay

## 2022-06-02 ENCOUNTER — Ambulatory Visit
Admission: RE | Admit: 2022-06-02 | Discharge: 2022-06-02 | Disposition: A | Discharge status: Home / Self Care | Payer: PPO | Source: Ambulatory Visit | Attending: Ophthalmology | Admitting: Ophthalmology

## 2022-06-02 DIAGNOSIS — H02401 Unspecified ptosis of right eyelid: Secondary | ICD-10-CM | POA: Diagnosis not present

## 2022-06-02 DIAGNOSIS — K219 Gastro-esophageal reflux disease without esophagitis: Secondary | ICD-10-CM | POA: Insufficient documentation

## 2022-06-02 DIAGNOSIS — Z7984 Long term (current) use of oral hypoglycemic drugs: Secondary | ICD-10-CM | POA: Insufficient documentation

## 2022-06-02 DIAGNOSIS — L719 Rosacea, unspecified: Secondary | ICD-10-CM | POA: Insufficient documentation

## 2022-06-02 DIAGNOSIS — I10 Essential (primary) hypertension: Secondary | ICD-10-CM | POA: Insufficient documentation

## 2022-06-02 DIAGNOSIS — E785 Hyperlipidemia, unspecified: Secondary | ICD-10-CM | POA: Insufficient documentation

## 2022-06-02 DIAGNOSIS — G4730 Sleep apnea, unspecified: Secondary | ICD-10-CM | POA: Insufficient documentation

## 2022-06-02 DIAGNOSIS — E119 Type 2 diabetes mellitus without complications: Secondary | ICD-10-CM | POA: Insufficient documentation

## 2022-06-02 DIAGNOSIS — E1159 Type 2 diabetes mellitus with other circulatory complications: Secondary | ICD-10-CM | POA: Diagnosis not present

## 2022-06-02 HISTORY — PX: BROW LIFT: SHX178

## 2022-06-02 LAB — GLUCOSE, CAPILLARY
Glucose-Capillary: 127 mg/dL — ABNORMAL HIGH (ref 70–99)
Glucose-Capillary: 129 mg/dL — ABNORMAL HIGH (ref 70–99)

## 2022-06-02 SURGERY — BLEPHAROPLASTY
Anesthesia: Monitor Anesthesia Care | Site: Eye | Laterality: Right

## 2022-06-02 MED ORDER — ERYTHROMYCIN 5 MG/GM OP OINT
TOPICAL_OINTMENT | OPHTHALMIC | Status: DC | PRN
  Administered 2022-06-02: 1 via OPHTHALMIC

## 2022-06-02 MED ORDER — LIDOCAINE HCL (CARDIAC) PF 100 MG/5ML IV SOSY
PREFILLED_SYRINGE | INTRAVENOUS | Status: DC | PRN
  Administered 2022-06-02: 60 mg via INTRAVENOUS

## 2022-06-02 MED ORDER — FENTANYL CITRATE (PF) 100 MCG/2ML IJ SOLN
INTRAMUSCULAR | Status: DC | PRN
  Administered 2022-06-02 (×2): 50 ug via INTRAVENOUS

## 2022-06-02 MED ORDER — MIDAZOLAM HCL 2 MG/2ML IJ SOLN
INTRAMUSCULAR | Status: DC | PRN
  Administered 2022-06-02 (×2): 1 mg via INTRAVENOUS

## 2022-06-02 MED ORDER — LACTATED RINGERS IV SOLN: INTRAVENOUS | Status: DC

## 2022-06-02 MED ORDER — BSS IO SOLN
INTRAOCULAR | Status: DC | PRN
  Administered 2022-06-02: 15 mL

## 2022-06-02 MED ORDER — TETRACAINE HCL 0.5 % OP SOLN
OPHTHALMIC | Status: DC | PRN
  Administered 2022-06-02: 2 [drp] via OPHTHALMIC

## 2022-06-02 MED ORDER — LIDOCAINE-EPINEPHRINE 2 %-1:100000 IJ SOLN
INTRAMUSCULAR | Status: DC | PRN
  Administered 2022-06-02: .5 mL via OPHTHALMIC

## 2022-06-02 MED ORDER — ERYTHROMYCIN 5 MG/GM OP OINT: TOPICAL_OINTMENT | OPHTHALMIC | 2 refills | Status: DC

## 2022-06-02 MED ORDER — PROPOFOL 500 MG/50ML IV EMUL
INTRAVENOUS | Status: DC | PRN
  Administered 2022-06-02: 40 mg via INTRAVENOUS
  Administered 2022-06-02: 10 mg via INTRAVENOUS
  Administered 2022-06-02: 30 mg via INTRAVENOUS
  Administered 2022-06-02: 20 mg via INTRAVENOUS

## 2022-06-02 SURGICAL SUPPLY — 22 items
APPLICATOR COTTON TIP WD 3 STR (MISCELLANEOUS) ×1 IMPLANT
BLADE SURG 15 STRL LF DISP TIS (BLADE) ×1 IMPLANT
BLADE SURG 15 STRL SS (BLADE) ×1
CORD BIP STRL DISP 12FT (MISCELLANEOUS) ×1 IMPLANT
GAUZE SPONGE 4X4 12PLY STRL (GAUZE/BANDAGES/DRESSINGS) ×1 IMPLANT
GLOVE SURG UNDER POLY LF SZ7 (GLOVE) ×2 IMPLANT
GOWN STRL REUS W/ TWL LRG LVL3 (GOWN DISPOSABLE) ×1 IMPLANT
GOWN STRL REUS W/TWL LRG LVL3 (GOWN DISPOSABLE) ×1
MARKER SKIN XFINE TIP W/RULER (MISCELLANEOUS) ×1 IMPLANT
NDL FILTER BLUNT 18X1 1/2 (NEEDLE) ×1 IMPLANT
NDL HYPO 30X.5 LL (NEEDLE) ×2 IMPLANT
NEEDLE FILTER BLUNT 18X1 1/2 (NEEDLE) ×1 IMPLANT
NEEDLE HYPO 30X.5 LL (NEEDLE) ×2 IMPLANT
PACK ENT CUSTOM (PACKS) ×1 IMPLANT
SOL PREP PVP 2OZ (MISCELLANEOUS) ×1
SOLUTION PREP PVP 2OZ (MISCELLANEOUS) ×1 IMPLANT
SPONGE GAUZE 2X2 8PLY STRL LF (GAUZE/BANDAGES/DRESSINGS) ×10 IMPLANT
SUT GUT PLAIN 6-0 1X18 ABS (SUTURE) ×1 IMPLANT
SUT PROLENE 6 0 P 1 18 (SUTURE) IMPLANT
SYR 10ML LL (SYRINGE) ×1 IMPLANT
SYR 3ML LL SCALE MARK (SYRINGE) ×1 IMPLANT
WATER STERILE IRR 250ML POUR (IV SOLUTION) ×1 IMPLANT

## 2022-06-02 NOTE — Interval H&P Note (Signed)
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  The various methods of treatment have been discussed with the patient and family. After consideration of risks, benefits and other options for treatment, the patient has consented to  Procedure(s) with comments: BLEPHAROPTOSIS REPAIR; RESECT EX RIGHT (Right) - Diabetic as a surgical intervention.  The patient's history has been reviewed, patient examined, no change in status, stable for surgery.  I have reviewed the patient's chart and labs.  Questions were answered to the patient's satisfaction.     Vickki Muff, Dieter Hane M

## 2022-06-02 NOTE — Transfer of Care (Signed)
Immediate Anesthesia Transfer of Care Note  Patient: Ivan Kennedy  Procedure(s) Performed: BLEPHAROPTOSIS REPAIR; RESECT EX RIGHT (Right: Eye)  Patient Location: PACU  Anesthesia Type: MAC  Level of Consciousness: awake, alert  and patient cooperative  Airway and Oxygen Therapy: Patient Spontanous Breathing and Patient connected to supplemental oxygen  Post-op Assessment: Post-op Vital signs reviewed, Patient's Cardiovascular Status Stable, Respiratory Function Stable, Patent Airway and No signs of Nausea or vomiting  Post-op Vital Signs: Reviewed and stable  Complications: No notable events documented.

## 2022-06-02 NOTE — Op Note (Signed)
Preoperative Diagnosis:  Visually significant blepharoptosis right  Upper Eyelid(s)  Postoperative Diagnosis:  Same.  Procedure(s) Performed:   Blepharoptosis repair with levator aponeurosis advancement right  Upper Eyelid(s)  Teaching Surgeon: Philis Pique. Vickki Muff, M.D.  Assistants: none  Anesthesia: MAC  Specimens: None.  Estimated Blood Loss: Minimal.  Complications: None.  Operative Findings: None Dictated  PROCEDURE:  Allergies were reviewed and the patient has No Known Allergies..   After the risks, benefits, complications and alternatives were discussed with the patient, appropriate informed consent was obtained. While seated in an upright position and looking in primary gaze, the mid pupillary line was marked on the upper eyelid margins bilaterally. The patient was then brought to the operating suite and reclined supine.  Timeout was conducted and the patient was sedated. Local anesthetic consisting of a 50-50 mixture of 2% lidocaine with epinephrine and 0.75% bupivacaine with added Hylenex was injected subcutaneously to the right  upper eyelid(s). After adequate local was instilled, the patient was prepped and draped in the usual sterile fashion for eyelid surgery.   Attention was turned to the upper eyelids. A 26m upper eyelid crease incision line was marked with calipers on right  upper eyelid(s).  Attention was turned to the  right  upper eyelid. A #15 blade was used to open the premarked incision line and hemostasis was obtained with bipolar cautery. Westcott scissors were then used to transect through orbicularis for the length of the incision down to the tarsal plate. Epitarsus was dissected to create a smooth surface to suture to. Dissection was then carried superiorly in the plane between orbicularis and orbital septum. Once the preaponeurotic fat pocket was identified, the orbital septum was opened. This revealed the levator and its aponeurosis.    3 interrupted 6-0  Prolene sutures were then passed partial thickness through the tarsal plates of right  upper eyelid(s). These sutures were placed in line with the mid pupillary, medial limbal, and lateral limbal lines. The sutures were fixed to the levator aponeurosis and adjusted until a nice lid height and contour were achieved. Once nice symmetry was achieved, the skin incisions were closed with a running 6-0 fast absorbing plain suture. The patient tolerated the procedure well. Erythromycin ophthalmic ointment was applied to the incision site(s) followed by ice packs. The patient was taken to the recovery area where he recovered without difficulty.  Post-Op Plan/Instructions:  Mr. CCollardwas instructed to use ice packs frequently for the next 48 hours. His was instructed to use Erythromycin ophthalmic ointment on his incisions 4 times a day for the next 12 to 14 days. He was given a prescription for tramadol (or similar) for pain control should Tylenol not be effective. [facility] was [facility] [place] needed [place] problems.  Sheamus Hasting M. FVickki Muff M.D. Ophthalmology

## 2022-06-02 NOTE — Anesthesia Postprocedure Evaluation (Signed)
Anesthesia Post Note  Patient: Ivan Kennedy  Procedure(s) Performed: BLEPHAROPTOSIS REPAIR; RESECT EX RIGHT (Right: Eye)  Patient location during evaluation: PACU Anesthesia Type: MAC Level of consciousness: awake and alert Pain management: pain level controlled Vital Signs Assessment: post-procedure vital signs reviewed and stable Respiratory status: spontaneous breathing, respiratory function stable and patient connected to nasal cannula oxygen Cardiovascular status: stable and blood pressure returned to baseline Postop Assessment: no apparent nausea or vomiting Anesthetic complications: no   No notable events documented.   Last Vitals:  Vitals:   06/02/22 0832 06/02/22 0843  BP: 130/77 136/85  Pulse: 75 68  Resp: 11 15  Temp: 36.4 C 36.4 C  SpO2: 98% 95%    Last Pain:  Vitals:   06/02/22 0843  PainSc: 0-No pain                 [place] Ouch

## 2022-06-02 NOTE — H&P (Signed)
[facility]  Primary Care Physician:  Virginia Crews, MD Ophthalmologist: Dr. Philis Pique. Vickki Muff, M.D.  Pre-Procedure History & Physical: HPI:  Ivan Kennedy is a 76 y.o. male here for periocular surgery.   Past Medical History:  Diagnosis Date   Colon polyp 2005   Diabetes mellitus ([place])    GERD (gastroesophageal reflux disease)    Hyperlipidemia    Hypertension    Rosacea    Sleep apnea    CPAP    Past Surgical History:  Procedure Laterality Date   ANAL FISSURE REPAIR  1998   BROW LIFT Bilateral 09/02/2021   Procedure: BLEPHAROPLASTY UPPER EYELID; W/EXCESS SKIN ECTROPION REPAIR, EXTENSIVE WEDGE EXCISION, UPPER BILATERAL;  Surgeon: Karle Starch, MD;  Location: [facility];  Service: Ophthalmology;  Laterality: Bilateral;  Diabetic  sleep apnea   CATARACT EXTRACTION W/PHACO Right 05/25/2021   Procedure: CATARACT EXTRACTION PHACO AND INTRAOCULAR LENS PLACEMENT (IOC) RIGHT DIABETIC 6.64 00:54.7;  Surgeon: Leandrew Koyanagi, MD;  Location: [facility];  Service: Ophthalmology;  Laterality: Right;  Diabetic, sleep apnea   CATARACT EXTRACTION W/PHACO Left 06/08/2021   Procedure: CATARACT EXTRACTION PHACO AND INTRAOCULAR LENS PLACEMENT ([place]) LEFT DIABETIC;  Surgeon: Leandrew Koyanagi, MD;  Location: [place];  Service: Ophthalmology;  Laterality: Left;  Diabetic, sleep apnea 7.57 01:02.2   COLONOSCOPY  2005, 2010   COLONOSCOPY WITH PROPOFOL N/A 01/19/2020   Procedure: COLONOSCOPY WITH PROPOFOL;  Surgeon: Jonathon Bellows, MD;  Location: [facility];  Service: Gastroenterology;  Laterality: N/A;   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 Earnestine Leys   shoulder spurs Right 2000    Prior to Admission medications   Medication Sig Start Date End Date Taking? Authorizing Provider  ARTIFICIAL TEARS 1 % ophthalmic solution  09/05/21  Yes [provider]  aspirin EC 81 MG tablet Take 81 mg by mouth daily.    Yes [provider]  Azelaic Acid 15 % gel Apply topically every morning. 12/28/21  Yes [provider]  doxycycline (VIBRAMYCIN) 50 MG capsule Take 50 mg by mouth 2 (two) times daily.   Yes [provider]  JARDIANCE 25 MG TABS tablet TAKE 1 TABLET BY MOUTH DAILY BEFORE BREAKFAST. 03/23/22  Yes Gwyneth Sprout, FNP  loratadine (CLARITIN) 10 MG tablet Take 10 mg by mouth daily.   Yes [provider]  losartan (COZAAR) 100 MG tablet TAKE 1 TABLET BY MOUTH EVERY DAY 04/11/22  Yes Bacigalupo, Dionne Bucy, MD  metoprolol succinate (TOPROL-XL) 25 MG 24 hr tablet TAKE 1 TABLET (25 MG TOTAL) BY MOUTH DAILY. 05/10/21  Yes Bacigalupo, Dionne Bucy, MD  metroNIDAZOLE (METROGEL) 1 % gel Apply 1 application topically every morning. 03/30/15  Yes [provider]  omeprazole (PRILOSEC) 40 MG capsule TAKE 1 CAPSULE BY MOUTH EVERY DAY 05/08/22  Yes Myles Gip, DO  pioglitazone (ACTOS) 45 MG tablet TAKE 1 TABLET BY MOUTH EVERY DAY 05/30/22  Yes Bacigalupo, Dionne Bucy, MD  POLYTRIM ophthalmic solution 1 drop 4 (four) times daily. 12/27/21  Yes [provider]  RYBELSUS 14 MG TABS TAKE 14 MG (1 TABLET) BY MOUTH DAILY BEFORE BREAKFAST. TAKE ON AN EMPTY STOMACH WITH A SIP OF WATER 03/31/22  Yes Tally Joe T, FNP  simvastatin (ZOCOR) 20 MG tablet TAKE 1 TABLET BY MOUTH EVERY DAY IN THE EVENING 12/19/21  Yes Bacigalupo, Dionne Bucy, MD  vitamin B-12 (CYANOCOBALAMIN) 1000 MCG tablet Take 1,000 mcg by mouth daily.   Yes [provider]  Blood Glucose Monitoring Suppl (ONE TOUCH ULTRA 2) w/Device KIT To check blood sugar once daily 03/25/20   [name] M, PA-C  glucose blood (ONETOUCH ULTRA) test strip CHECK BLOOD SUGAR ONCE DAILY 10/03/21   Brita Romp, Dionne Bucy, MD  hydrochlorothiazide (MICROZIDE) 12.5 MG capsule Take 1 capsule (12.5 mg total) by mouth daily. Patient not taking: Reported on 05/29/2022 01/02/22   Virginia Crews, MD  Lancets [facility] SOFT [facility]) lancets To  check blood glucose daily 08/22/17   Mar Daring, PA-C  [facility] ULTRA test strip CHECK BLOOD SUGAR ONCE DAILY 10/05/21   Virginia Crews, MD  tretinoin (RETIN-A) 0.05 % cream APPLY 1 APPLICATION TOPICALLY AT BEDTIME. 12/21/21   Virginia Crews, MD    Allergies as of 04/13/2022   (No Known Allergies)    Family History  Problem Relation Age of Onset   Diabetes Mother     Social History   Socioeconomic History   Marital status: Married    Spouse name: Not on file   Number of children: 1   Years of education: Not on file   Highest education level: GED or equivalent  Occupational History   Occupation: retired  Tobacco Use   Smoking status: Never   Smokeless tobacco: Never  Vaping Use   Vaping Use: Never used  Substance and Sexual Activity   Alcohol use: Yes    Alcohol/week: 0.0 standard drinks of alcohol    Comment: occasionally- beer 1-2/month   Drug use: No   Sexual activity: Not on file  Other Topics Concern   Not on file  Social History Narrative   Not on file   Social Determinants of Health   Financial Resource Strain: Low Risk  (12/27/2021)   Overall Financial Resource Strain (CARDIA)    Difficulty of Paying Living Expenses: Not hard at all  Food Insecurity: No Food Insecurity (12/27/2021)   Hunger Vital Sign    Worried About Running Out of Food in the Last Year: Never true    Ran Out of Food in the Last Year: Never true  Transportation Needs: No Transportation Needs (12/27/2021)   PRAPARE - Hydrologist (Medical): No    Lack of Transportation (Non-Medical): No  Physical Activity: Insufficiently Active (12/27/2021)   Exercise Vital Sign    Days of Exercise per Week: 3 days    Minutes of Exercise per Session: 30 min  Stress: No Stress Concern Present (12/27/2021)   [place]    Feeling of Stress : Not at all  Social Connections: [place]  (12/27/2021)   Social Connection and Isolation Panel [NHANES]    Frequency of Communication with Friends and Family: Never    Frequency of Social Gatherings with Friends and Family: More than three times a week    Attends Religious Services: More than 4 times per year    Active Member of [employer] or Organizations: Yes    Attends Music therapist: More than 4 times per year    Marital Status: Married  Human resources officer Violence: Not At Risk (12/27/2021)   Humiliation, Afraid, Rape, and Kick questionnaire    Fear of Current or Ex-Partner: No    Emotionally Abused: No    Physically Abused: No    Sexually Abused: No    Review of Systems: See HPI, otherwise negative ROS  Physical Exam: BP (!) 148/90   Pulse 69   Temp 97.6 F (  36.4 C)   Ht _0  (1.854 m)   Wt 103.9 kg   SpO2 97%   BMI 30.21 kg/m  General:   Alert and cooperative in NAD Head:  Normocephalic and atraumatic. Respiratory:  Normal work of breathing.  Impression/Plan: Ivan Kennedy is here for periocular surgery.  Risks, benefits, limitations, and alternatives regarding surgery have been reviewed with the patient.  Questions have been answered.  All parties agreeable.   Karle Starch, MD  06/02/2022, 7:55 AM

## 2022-06-05 ENCOUNTER — Encounter: Payer: Self-pay | Admitting: Ophthalmology

## 2022-06-21 ENCOUNTER — Other Ambulatory Visit: Payer: Self-pay | Admitting: Family Medicine

## 2022-06-21 DIAGNOSIS — E7800 Pure hypercholesterolemia, unspecified: Secondary | ICD-10-CM

## 2022-06-21 NOTE — Telephone Encounter (Signed)
Requested Prescriptions  Pending Prescriptions Disp Refills   simvastatin (ZOCOR) 20 MG tablet [Pharmacy Med Name: SIMVASTATIN 20 MG TABLET] 90 tablet 0    Sig: TAKE 1 TABLET BY MOUTH EVERY DAY IN THE EVENING     Cardiovascular:  Antilipid - Statins Failed - 06/21/2022  2:31 AM      Failed - Lipid Panel in normal range within the last 12 months    Cholesterol, Total  Date Value Ref Range Status  09/30/2021 121 100 - 199 mg/dL Final   LDL Cholesterol (Calc)  Date Value Ref Range Status  04/26/2017 38 mg/dL (calc) Final    Comment:    Reference range: <100 . Desirable range <100 mg/dL for primary prevention;   <70 mg/dL for patients with CHD or diabetic patients  with > or = 2 CHD risk factors. Marland Kitchen LDL-C is now calculated using the Martin-Hopkins  calculation, which is a validated novel method providing  better accuracy than the Friedewald equation in the  estimation of LDL-C.  Cresenciano Genre et al. Annamaria Helling. 0160;109(32): 2061-2068  (http://education.[employer].com/faq/FAQ164)    LDL Chol Calc (NIH)  Date Value Ref Range Status  09/30/2021 56 0 - 99 mg/dL Final   HDL  Date Value Ref Range Status  09/30/2021 37 (L) >39 mg/dL Final   Triglycerides  Date Value Ref Range Status  09/30/2021 168 (H) 0 - 149 mg/dL Final         Passed - Patient is not pregnant      Passed - Valid encounter within last 12 months    Recent Outpatient Visits           5 months ago Hypertension associated with diabetes [facility])   [place], Dionne Bucy, MD   5 months ago Encounter for [employer] annual wellness exam   [place], Keane Police S, LPN   8 months ago Type 2 diabetes mellitus with other specified complication, without long-term current use of insulin [facility])   [facility] [place], Dionne Bucy, MD   11 months ago Hypertension associated with diabetes [facility])   [facility], Dionne Bucy, MD   1 year ago Type 2  diabetes mellitus with hyperglycemia, without long-term current use of insulin [facility])   [place], Dionne Bucy, MD       Future Appointments             In 2 weeks Bacigalupo, Dionne Bucy, MD [facility]

## 2022-07-02 ENCOUNTER — Other Ambulatory Visit: Payer: Self-pay | Admitting: Family Medicine

## 2022-07-02 DIAGNOSIS — I10 Essential (primary) hypertension: Secondary | ICD-10-CM

## 2022-07-04 NOTE — Progress Notes (Deleted)
I,Sulibeya S Dimas,acting as a scribe for Lavon Paganini, MD.,have documented all relevant documentation on the behalf of Lavon Paganini, MD,as directed by  Lavon Paganini, MD while in the presence of Lavon Paganini, MD.     Established patient visit   Patient: Ivan Kennedy   DOB: 1946/08/12   76 y.o. Male  MRN: [id number] Visit Date: 07/10/2022  Today's healthcare provider: Lavon Paganini, MD   No chief complaint on file.  Subjective    HPI  Hypertension, follow-up  BP Readings from Last 3 Encounters:  06/02/22 136/85  01/02/22 130/85  09/30/21 117/72   Wt Readings from Last 3 Encounters:  06/02/22 229 lb (103.9 kg)  01/02/22 233 lb (105.7 kg)  12/27/21 243 lb (110.2 kg)     He was last seen for hypertension 6 months ago.  BP at that visit was 130/85. Management since that visit includes no changes. Patient to resume hctz.  He reports {excellent/good/fair/poor:19665} compliance with treatment. He {is/is not:9024} having side effects. {document side effects if present:1} He is following a {diet:21022986} diet. He {is/is not:9024} exercising. He {does/does not:200015} smoke.  Use of agents associated with hypertension: {bp agents assoc with hypertension:511::"none"}.   Outside blood pressures are {***enter patient reported home BP readings, or 'not being checked':1}. Symptoms: {Yes/No:20286} chest pain {Yes/No:20286} chest pressure  {Yes/No:20286} palpitations {Yes/No:20286} syncope  {Yes/No:20286} dyspnea {Yes/No:20286} orthopnea  {Yes/No:20286} paroxysmal nocturnal dyspnea {Yes/No:20286} lower extremity edema   Pertinent labs Lab Results  Component Value Date   CHOL 121 09/30/2021   HDL 37 (L) 09/30/2021   LDLCALC 56 09/30/2021   TRIG 168 (H) 09/30/2021   CHOLHDL 3.3 09/30/2021   Lab Results  Component Value Date   NA 144 09/30/2021   K 3.9 09/30/2021   CREATININE 1.26 09/30/2021   EGFR 60 09/30/2021   GLUCOSE 140 (H) 09/30/2021   TSH  1.650 10/30/2018     The ASCVD Risk score (Arnett DK, et al., 2019) failed to calculate for the following reasons:   The valid total cholesterol range is 130 to 320 mg/dL  --------------------------------------------------------------------------------------------------- Diabetes Mellitus Type II, Follow-up  Lab Results  Component Value Date   HGBA1C 6.7 (A) 01/02/2022   HGBA1C 8.0 (H) 09/30/2021   HGBA1C 6.4 (A) 06/28/2021   Wt Readings from Last 3 Encounters:  06/02/22 229 lb (103.9 kg)  01/02/22 233 lb (105.7 kg)  12/27/21 243 lb (110.2 kg)   Last seen for diabetes 6 months ago.  Management since then includes no changes. He reports {excellent/good/fair/poor:19665} compliance with treatment. He {is/is not:21021397} having side effects. {document side effects if present:1} Symptoms: {Yes/No:20286} fatigue {Yes/No:20286} foot ulcerations  {Yes/No:20286} appetite changes {Yes/No:20286} nausea  {Yes/No:20286} paresthesia of the feet  {Yes/No:20286} polydipsia  {Yes/No:20286} polyuria {Yes/No:20286} visual disturbances   {Yes/No:20286} vomiting     Home blood sugar records: {diabetes glucometry results:16657}  Episodes of hypoglycemia? {Yes/No:20286} {enter symptoms and frequency of symptoms if yes:1}   Current insulin regiment: none Most Recent Eye Exam: *** {Current exercise:16438:::1} {Current diet habits:16563:::1}  Pertinent Labs: Lab Results  Component Value Date   CHOL 121 09/30/2021   HDL 37 (L) 09/30/2021   LDLCALC 56 09/30/2021   TRIG 168 (H) 09/30/2021   CHOLHDL 3.3 09/30/2021   Lab Results  Component Value Date   NA 144 09/30/2021   K 3.9 09/30/2021   CREATININE 1.26 09/30/2021   EGFR 60 09/30/2021   MICROALBUR 20 04/24/2018   LABMICR 9.5 09/30/2021     --------------------------------------------------------------------------------------------------- Lipid/Cholesterol, Follow-up  Last  lipid panel Other pertinent labs  Lab Results  Component  Value Date   CHOL 121 09/30/2021   HDL 37 (L) 09/30/2021   LDLCALC 56 09/30/2021   TRIG 168 (H) 09/30/2021   CHOLHDL 3.3 09/30/2021   Lab Results  Component Value Date   ALT 20 09/30/2021   AST 20 09/30/2021   PLT 172 11/06/2019   TSH 1.650 10/30/2018     He was last seen for this 6 months ago.  Management since that visit includes no changes.  He reports {excellent/good/fair/poor:19665} compliance with treatment. He {is/is not:9024} having side effects. {document side effects if present:1}  Symptoms: {Yes/No:20286} chest pain {Yes/No:20286} chest pressure/discomfort  {Yes/No:20286} dyspnea {Yes/No:20286} lower extremity edema  {Yes/No:20286} numbness or tingling of extremity {Yes/No:20286} orthopnea  {Yes/No:20286} palpitations {Yes/No:20286} paroxysmal nocturnal dyspnea  {Yes/No:20286} speech difficulty {Yes/No:20286} syncope    The ASCVD Risk score (Arnett DK, et al., 2019) failed to calculate for the following reasons:   The valid total cholesterol range is 130 to 320 mg/dL  ---------------------------------------------------------------------------------------------------   Medications: Outpatient Medications Prior to Visit  Medication Sig   ARTIFICIAL TEARS 1 % ophthalmic solution    aspirin EC 81 MG tablet Take 81 mg by mouth daily.    Azelaic Acid 15 % gel Apply topically every morning.   Blood Glucose Monitoring Suppl (ONE TOUCH ULTRA 2) w/Device KIT To check blood sugar once daily   doxycycline (VIBRAMYCIN) 50 MG capsule Take 50 mg by mouth 2 (two) times daily.   erythromycin ophthalmic ointment Apply to sutures 4 times a day for 10-12 days.  Discontinue if allergy develops and call our office   glucose blood (ONETOUCH ULTRA) test strip CHECK BLOOD SUGAR ONCE DAILY   hydrochlorothiazide (MICROZIDE) 12.5 MG capsule Take 1 capsule (12.5 mg total) by mouth daily. (Patient not taking: Reported on 05/29/2022)   JARDIANCE 25 MG TABS tablet TAKE 1 TABLET BY MOUTH DAILY  BEFORE BREAKFAST.   Lancets (ACCU-CHEK SOFT TOUCH) lancets To check blood glucose daily   loratadine (CLARITIN) 10 MG tablet Take 10 mg by mouth daily.   losartan (COZAAR) 100 MG tablet TAKE 1 TABLET BY MOUTH EVERY DAY   metoprolol succinate (TOPROL-XL) 25 MG 24 hr tablet TAKE 1 TABLET (25 MG TOTAL) BY MOUTH DAILY.   metroNIDAZOLE (METROGEL) 1 % gel Apply 1 application topically every morning.   omeprazole (PRILOSEC) 40 MG capsule TAKE 1 CAPSULE BY MOUTH EVERY DAY   ONETOUCH ULTRA test strip CHECK BLOOD SUGAR ONCE DAILY   pioglitazone (ACTOS) 45 MG tablet TAKE 1 TABLET BY MOUTH EVERY DAY   POLYTRIM ophthalmic solution 1 drop 4 (four) times daily.   RYBELSUS 14 MG TABS TAKE 14 MG (1 TABLET) BY MOUTH DAILY BEFORE BREAKFAST. TAKE ON AN EMPTY STOMACH WITH A SIP OF WATER   simvastatin (ZOCOR) 20 MG tablet TAKE 1 TABLET BY MOUTH EVERY DAY IN THE EVENING   tretinoin (RETIN-A) 0.05 % cream APPLY 1 APPLICATION TOPICALLY AT BEDTIME.   vitamin B-12 (CYANOCOBALAMIN) 1000 MCG tablet Take 1,000 mcg by mouth daily.   No facility-administered medications prior to visit.    Review of Systems  Constitutional:  Negative for activity change, appetite change and fatigue.  Eyes:  Negative for visual disturbance.  Respiratory:  Negative for chest tightness and shortness of breath.   Cardiovascular:  Negative for chest pain, palpitations and leg swelling.  Gastrointestinal:  Negative for abdominal pain, diarrhea, nausea and vomiting.    {Labs  Heme  Chem  Endocrine  Serology  Results Review (optional):23779}   Objective    There were no vitals taken for this visit. BP Readings from Last 3 Encounters:  06/02/22 136/85  01/02/22 130/85  09/30/21 117/72   Wt Readings from Last 3 Encounters:  06/02/22 229 lb (103.9 kg)  01/02/22 233 lb (105.7 kg)  12/27/21 243 lb (110.2 kg)      Physical Exam  ***  No results found for any visits on 07/10/22.  Assessment & Plan     ***  No follow-ups on  file.      {provider attestation***:1}   Lavon Paganini, MD  [facility] [phone] (phone) [phone] (fax)  [place]

## 2022-07-10 ENCOUNTER — Ambulatory Visit: Payer: Medicare Other | Admitting: Family Medicine

## 2022-07-10 DIAGNOSIS — I152 Hypertension secondary to endocrine disorders: Secondary | ICD-10-CM

## 2022-07-10 DIAGNOSIS — E1169 Type 2 diabetes mellitus with other specified complication: Secondary | ICD-10-CM

## 2022-07-25 NOTE — Progress Notes (Unsigned)
I,Brielyn Bosak S Avyanna Spada,acting as a scribe for Lavon Paganini, MD.,have documented all relevant documentation on the behalf of Lavon Paganini, MD,as directed by  Lavon Paganini, MD while in the presence of Lavon Paganini, MD.     Established patient visit   Patient: Ivan Kennedy   DOB: 1946/03/14   76 y.o. Male  MRN: [id number] Visit Date: 07/27/2022  Today's healthcare provider: Lavon Paganini, MD   No chief complaint on file.  Subjective    HPI  Diabetes Mellitus Type II, follow-up  Lab Results  Component Value Date   HGBA1C 6.7 (A) 01/02/2022   HGBA1C 8.0 (H) 09/30/2021   HGBA1C 6.4 (A) 06/28/2021   Last seen for diabetes 6 months ago.  Management since then includes continuing the same treatment. He reports {excellent/good/fair/poor:19665} compliance with treatment. He {is/is not:21021397} having side effects. {document side effects if present:1}  Home blood sugar records: {diabetes glucometry results:16657}  Episodes of hypoglycemia? {Yes/No:20286} {enter details if yes:1}   Current insulin regiment: none Most Recent Eye Exam: ***  --------------------------------------------------------------------------------------------------- Lipid/Cholesterol, follow-up  Last Lipid Panel: Lab Results  Component Value Date   CHOL 121 09/30/2021   LDLCALC 56 09/30/2021   HDL 37 (L) 09/30/2021   TRIG 168 (H) 09/30/2021    He was last seen for this 6 months ago.  Management since that visit includes no changes.  He reports {excellent/good/fair/poor:19665} compliance with treatment. He {is/is not:9024} having side effects. {document side effects if ZDGUYQI:3}  Last metabolic panel Lab Results  Component Value Date   GLUCOSE 140 (H) 09/30/2021   NA 144 09/30/2021   K 3.9 09/30/2021   BUN 22 09/30/2021   CREATININE 1.26 09/30/2021   EGFR 60 09/30/2021   GFRNONAA 73 11/06/2019   CALCIUM 9.1 09/30/2021   AST 20 09/30/2021   ALT 20 09/30/2021   The  ASCVD Risk score (Arnett DK, et al., 2019) failed to calculate for the following reasons:   The valid total cholesterol range is 130 to 320 mg/dL  --------------------------------------------------------------------------------------------------- Hypertension, follow-up  BP Readings from Last 3 Encounters:  06/02/22 136/85  01/02/22 130/85  09/30/21 117/72   Wt Readings from Last 3 Encounters:  06/02/22 229 lb (103.9 kg)  01/02/22 233 lb (105.7 kg)  12/27/21 243 lb (110.2 kg)     He was last seen for hypertension 6 months ago.  BP at that visit was 130/85. Management since that visit includes no changes.  He reports {excellent/good/fair/poor:19665} compliance with treatment. He {is/is not:9024} having side effects. {document side effects if present:1}  Use of agents associated with hypertension: {bp agents assoc with hypertension:511::"none"}.   Outside blood pressures are {***enter patient reported home BP readings, or 'not being checked':1}.  Pertinent labs Lab Results  Component Value Date   CHOL 121 09/30/2021   HDL 37 (L) 09/30/2021   LDLCALC 56 09/30/2021   TRIG 168 (H) 09/30/2021   CHOLHDL 3.3 09/30/2021   Lab Results  Component Value Date   NA 144 09/30/2021   K 3.9 09/30/2021   CREATININE 1.26 09/30/2021   EGFR 60 09/30/2021   GLUCOSE 140 (H) 09/30/2021   TSH 1.650 10/30/2018     The ASCVD Risk score (Arnett DK, et al., 2019) failed to calculate for the following reasons:   The valid total cholesterol range is 130 to 320 mg/dL  ---------------------------------------------------------------------------------------------------  Medications: Outpatient Medications Prior to Visit  Medication Sig   ARTIFICIAL TEARS 1 % ophthalmic solution    aspirin EC 81 MG tablet Take 81  mg by mouth daily.    Azelaic Acid 15 % gel Apply topically every morning.   Blood Glucose Monitoring Suppl (ONE TOUCH ULTRA 2) w/Device KIT To check blood sugar once daily   doxycycline  (VIBRAMYCIN) 50 MG capsule Take 50 mg by mouth 2 (two) times daily.   erythromycin ophthalmic ointment Apply to sutures 4 times a day for 10-12 days.  Discontinue if allergy develops and call our office   glucose blood (ONETOUCH ULTRA) test strip CHECK BLOOD SUGAR ONCE DAILY   hydrochlorothiazide (MICROZIDE) 12.5 MG capsule Take 1 capsule (12.5 mg total) by mouth daily. (Patient not taking: Reported on 05/29/2022)   JARDIANCE 25 MG TABS tablet TAKE 1 TABLET BY MOUTH DAILY BEFORE BREAKFAST.   Lancets (ACCU-CHEK SOFT TOUCH) lancets To check blood glucose daily   loratadine (CLARITIN) 10 MG tablet Take 10 mg by mouth daily.   losartan (COZAAR) 100 MG tablet TAKE 1 TABLET BY MOUTH EVERY DAY   metoprolol succinate (TOPROL-XL) 25 MG 24 hr tablet TAKE 1 TABLET (25 MG TOTAL) BY MOUTH DAILY.   metroNIDAZOLE (METROGEL) 1 % gel Apply 1 application topically every morning.   omeprazole (PRILOSEC) 40 MG capsule TAKE 1 CAPSULE BY MOUTH EVERY DAY   ONETOUCH ULTRA test strip CHECK BLOOD SUGAR ONCE DAILY   pioglitazone (ACTOS) 45 MG tablet TAKE 1 TABLET BY MOUTH EVERY DAY   POLYTRIM ophthalmic solution 1 drop 4 (four) times daily.   RYBELSUS 14 MG TABS TAKE 14 MG (1 TABLET) BY MOUTH DAILY BEFORE BREAKFAST. TAKE ON AN EMPTY STOMACH WITH A SIP OF WATER   simvastatin (ZOCOR) 20 MG tablet TAKE 1 TABLET BY MOUTH EVERY DAY IN THE EVENING   tretinoin (RETIN-A) 0.05 % cream APPLY 1 APPLICATION TOPICALLY AT BEDTIME.   vitamin B-12 (CYANOCOBALAMIN) 1000 MCG tablet Take 1,000 mcg by mouth daily.   No facility-administered medications prior to visit.    Review of Systems  {Labs  Heme  Chem  Endocrine  Serology  Results Review (optional):23779}   Objective    There were no vitals taken for this visit. {Show previous vital signs (optional):23777}  Physical Exam  ***  No results found for any visits on 07/27/22.  Assessment & Plan     ***  No follow-ups on file.      {provider  attestation***:1}   Lavon Paganini, MD  [facility] [phone] (phone) [phone] (fax)  [place]

## 2022-07-26 DIAGNOSIS — G4733 Obstructive sleep apnea (adult) (pediatric): Secondary | ICD-10-CM | POA: Diagnosis not present

## 2022-07-27 ENCOUNTER — Ambulatory Visit (INDEPENDENT_AMBULATORY_CARE_PROVIDER_SITE_OTHER): Payer: PPO | Admitting: Family Medicine

## 2022-07-27 ENCOUNTER — Encounter: Payer: Self-pay | Admitting: Family Medicine

## 2022-07-27 VITALS — BP 130/70 | HR 76 | Temp 97.7°F | Resp 16 | Wt 231.0 lb

## 2022-07-27 DIAGNOSIS — I152 Hypertension secondary to endocrine disorders: Secondary | ICD-10-CM

## 2022-07-27 DIAGNOSIS — E669 Obesity, unspecified: Secondary | ICD-10-CM | POA: Diagnosis not present

## 2022-07-27 DIAGNOSIS — E7800 Pure hypercholesterolemia, unspecified: Secondary | ICD-10-CM

## 2022-07-27 DIAGNOSIS — Z23 Encounter for immunization: Secondary | ICD-10-CM

## 2022-07-27 DIAGNOSIS — E785 Hyperlipidemia, unspecified: Secondary | ICD-10-CM

## 2022-07-27 DIAGNOSIS — Z6830 Body mass index (BMI) 30.0-30.9, adult: Secondary | ICD-10-CM

## 2022-07-27 DIAGNOSIS — I10 Essential (primary) hypertension: Secondary | ICD-10-CM

## 2022-07-27 DIAGNOSIS — E1169 Type 2 diabetes mellitus with other specified complication: Secondary | ICD-10-CM

## 2022-07-27 DIAGNOSIS — E1159 Type 2 diabetes mellitus with other circulatory complications: Secondary | ICD-10-CM | POA: Diagnosis not present

## 2022-07-27 DIAGNOSIS — E1165 Type 2 diabetes mellitus with hyperglycemia: Secondary | ICD-10-CM | POA: Diagnosis not present

## 2022-07-27 LAB — POCT GLYCOSYLATED HEMOGLOBIN (HGB A1C)
Est. average glucose Bld gHb Est-mCnc: 131
Hemoglobin A1C: 6.2 % — AB (ref 4.0–5.6)

## 2022-07-27 MED ORDER — EMPAGLIFLOZIN 25 MG PO TABS: 25.0000 mg | ORAL_TABLET | Freq: Every day | ORAL | 1 refills | Status: DC

## 2022-07-27 MED ORDER — PIOGLITAZONE HCL 30 MG PO TABS: 30.0000 mg | ORAL_TABLET | Freq: Every day | ORAL | 1 refills | Status: DC

## 2022-07-27 MED ORDER — LOSARTAN POTASSIUM 100 MG PO TABS: 100.0000 mg | ORAL_TABLET | Freq: Every day | ORAL | 1 refills | Status: DC

## 2022-07-27 MED ORDER — SIMVASTATIN 20 MG PO TABS: 20.0000 mg | ORAL_TABLET | Freq: Every day | ORAL | 3 refills | Status: DC

## 2022-07-27 NOTE — Assessment & Plan Note (Signed)
Discussed importance of healthy weight management Discussed diet and exercise  

## 2022-07-27 NOTE — Assessment & Plan Note (Signed)
Well controlled  Continue current medications, except decr actos to 30 mg daily Will try to d/c actos in the future Assoc with HTN and HLD UTD on vaccines, eye exam (ROI sent), foot exam Recheck UACR On ACEi/ARB On Statin Discussed diet and exercise F/u in 6 months

## 2022-07-27 NOTE — Assessment & Plan Note (Signed)
Previously well controlled Continue statin Repeat FLP and CMP Goal LDL < 70 

## 2022-07-27 NOTE — Assessment & Plan Note (Signed)
Well controlled Continue current medications Recheck metabolic panel F/u in 6 months  

## 2022-07-28 LAB — LIPID PANEL
Chol/HDL Ratio: 2.2 ratio (ref 0.0–5.0)
Cholesterol, Total: 112 mg/dL (ref 100–199)
HDL: 51 mg/dL (ref >39)
LDL Chol Calc (NIH): 45 mg/dL (ref 0–99)
Triglycerides: 76 mg/dL (ref 0–149)
VLDL Cholesterol Cal: 16 mg/dL (ref 5–40)

## 2022-07-28 LAB — COMPREHENSIVE METABOLIC PANEL
ALT: 18 IU/L (ref 0–44)
AST: 24 IU/L (ref 0–40)
Albumin/Globulin Ratio: 2.9 — ABNORMAL HIGH (ref 1.2–2.2)
Albumin: 4.3 g/dL (ref 3.8–4.8)
Alkaline Phosphatase: 62 IU/L (ref 44–121)
BUN/Creatinine Ratio: 15 (ref 10–24)
BUN: 16 mg/dL (ref 8–27)
Bilirubin Total: 1.3 mg/dL — ABNORMAL HIGH (ref 0.0–1.2)
CO2: 23 mmol/L (ref 20–29)
Calcium: 9.5 mg/dL (ref 8.6–10.2)
Chloride: 107 mmol/L — ABNORMAL HIGH (ref 96–106)
Creatinine, Ser: 1.08 mg/dL (ref 0.76–1.27)
Globulin, Total: 1.5 g/dL (ref 1.5–4.5)
Glucose: 121 mg/dL — ABNORMAL HIGH (ref 70–99)
Potassium: 3.5 mmol/L (ref 3.5–5.2)
Sodium: 145 mmol/L — ABNORMAL HIGH (ref 134–144)
Total Protein: 5.8 g/dL — ABNORMAL LOW (ref 6.0–8.5)
eGFR: 72 mL/min/{1.73_m2} (ref >59)

## 2022-07-28 LAB — MICROALBUMIN / CREATININE URINE RATIO
Creatinine, Urine: 118.5 mg/dL
Microalb/Creat Ratio: 13 mg/g creat (ref 0–29)
Microalbumin, Urine: 15 ug/mL

## 2022-09-08 DIAGNOSIS — H0100A Unspecified blepharitis right eye, upper and lower eyelids: Secondary | ICD-10-CM | POA: Diagnosis not present

## 2022-09-08 DIAGNOSIS — E119 Type 2 diabetes mellitus without complications: Secondary | ICD-10-CM | POA: Diagnosis not present

## 2022-09-08 DIAGNOSIS — H538 Other visual disturbances: Secondary | ICD-10-CM | POA: Diagnosis not present

## 2022-09-08 LAB — HM DIABETES EYE EXAM

## 2022-10-01 ENCOUNTER — Other Ambulatory Visit: Payer: Self-pay | Admitting: Family Medicine

## 2022-10-01 DIAGNOSIS — E1165 Type 2 diabetes mellitus with hyperglycemia: Secondary | ICD-10-CM

## 2022-10-04 ENCOUNTER — Other Ambulatory Visit: Payer: Self-pay | Admitting: Family Medicine

## 2022-10-09 NOTE — Telephone Encounter (Signed)
Pt called saying the pharmacy told him they could fill the Rybelsus .  Pt would like a nurse call back.  He took his last pill this morning.  Pharmacy [facility]  [place].  CB#  [phone]

## 2022-10-12 DIAGNOSIS — H10503 Unspecified blepharoconjunctivitis, bilateral: Secondary | ICD-10-CM | POA: Diagnosis not present

## 2022-11-07 DIAGNOSIS — H10503 Unspecified blepharoconjunctivitis, bilateral: Secondary | ICD-10-CM | POA: Diagnosis not present

## 2022-12-05 DIAGNOSIS — H01003 Unspecified blepharitis right eye, unspecified eyelid: Secondary | ICD-10-CM | POA: Diagnosis not present

## 2022-12-24 ENCOUNTER — Other Ambulatory Visit: Payer: Self-pay | Admitting: Family Medicine

## 2022-12-24 DIAGNOSIS — E1165 Type 2 diabetes mellitus with hyperglycemia: Secondary | ICD-10-CM

## 2023-01-02 DIAGNOSIS — M3501 Sicca syndrome with keratoconjunctivitis: Secondary | ICD-10-CM | POA: Diagnosis not present

## 2023-01-02 DIAGNOSIS — H0100A Unspecified blepharitis right eye, upper and lower eyelids: Secondary | ICD-10-CM | POA: Diagnosis not present

## 2023-01-02 DIAGNOSIS — B880 Other acariasis: Secondary | ICD-10-CM | POA: Diagnosis not present

## 2023-02-01 ENCOUNTER — Ambulatory Visit: Payer: Medicare Other | Admitting: Family Medicine

## 2023-02-08 DIAGNOSIS — Z859 Personal history of malignant neoplasm, unspecified: Secondary | ICD-10-CM | POA: Diagnosis not present

## 2023-02-08 DIAGNOSIS — L718 Other rosacea: Secondary | ICD-10-CM | POA: Diagnosis not present

## 2023-02-08 DIAGNOSIS — L711 Rhinophyma: Secondary | ICD-10-CM | POA: Diagnosis not present

## 2023-02-08 DIAGNOSIS — L578 Other skin changes due to chronic exposure to nonionizing radiation: Secondary | ICD-10-CM | POA: Diagnosis not present

## 2023-02-08 DIAGNOSIS — Z872 Personal history of diseases of the skin and subcutaneous tissue: Secondary | ICD-10-CM | POA: Diagnosis not present

## 2023-02-08 DIAGNOSIS — L570 Actinic keratosis: Secondary | ICD-10-CM | POA: Diagnosis not present

## 2023-02-08 DIAGNOSIS — Z85828 Personal history of other malignant neoplasm of skin: Secondary | ICD-10-CM | POA: Diagnosis not present

## 2023-02-20 ENCOUNTER — Ambulatory Visit: Payer: Medicare Other | Admitting: Family Medicine

## 2023-02-22 ENCOUNTER — Ambulatory Visit (INDEPENDENT_AMBULATORY_CARE_PROVIDER_SITE_OTHER): Payer: PPO | Admitting: Family Medicine

## 2023-02-22 ENCOUNTER — Encounter: Payer: Self-pay | Admitting: Family Medicine

## 2023-02-22 VITALS — BP 128/79 | HR 66 | Temp 98.3°F | Resp 16 | Ht 73.0 in | Wt 220.0 lb

## 2023-02-22 DIAGNOSIS — E1159 Type 2 diabetes mellitus with other circulatory complications: Secondary | ICD-10-CM

## 2023-02-22 DIAGNOSIS — E1169 Type 2 diabetes mellitus with other specified complication: Secondary | ICD-10-CM | POA: Diagnosis not present

## 2023-02-22 DIAGNOSIS — N401 Enlarged prostate with lower urinary tract symptoms: Secondary | ICD-10-CM

## 2023-02-22 DIAGNOSIS — E1165 Type 2 diabetes mellitus with hyperglycemia: Secondary | ICD-10-CM | POA: Diagnosis not present

## 2023-02-22 DIAGNOSIS — G4733 Obstructive sleep apnea (adult) (pediatric): Secondary | ICD-10-CM | POA: Diagnosis not present

## 2023-02-22 DIAGNOSIS — R351 Nocturia: Secondary | ICD-10-CM

## 2023-02-22 DIAGNOSIS — Z6830 Body mass index (BMI) 30.0-30.9, adult: Secondary | ICD-10-CM | POA: Diagnosis not present

## 2023-02-22 DIAGNOSIS — E785 Hyperlipidemia, unspecified: Secondary | ICD-10-CM | POA: Diagnosis not present

## 2023-02-22 DIAGNOSIS — I152 Hypertension secondary to endocrine disorders: Secondary | ICD-10-CM | POA: Diagnosis not present

## 2023-02-22 DIAGNOSIS — L719 Rosacea, unspecified: Secondary | ICD-10-CM

## 2023-02-22 DIAGNOSIS — E669 Obesity, unspecified: Secondary | ICD-10-CM

## 2023-02-22 DIAGNOSIS — E538 Deficiency of other specified B group vitamins: Secondary | ICD-10-CM | POA: Diagnosis not present

## 2023-02-22 MED ORDER — LOSARTAN POTASSIUM 100 MG PO TABS: 100.0000 mg | ORAL_TABLET | Freq: Every day | ORAL | 3 refills | Status: DC

## 2023-02-22 MED ORDER — TAMSULOSIN HCL 0.4 MG PO CAPS: 0.4000 mg | ORAL_CAPSULE | Freq: Every day | ORAL | 3 refills | Status: DC

## 2023-02-22 MED ORDER — METOPROLOL SUCCINATE ER 25 MG PO TB24: 25.0000 mg | ORAL_TABLET | Freq: Every day | ORAL | 3 refills | Status: DC

## 2023-02-22 MED ORDER — RYBELSUS 14 MG PO TABS: ORAL_TABLET | ORAL | 5 refills | Status: DC

## 2023-02-22 MED ORDER — TRETINOIN 0.05 % EX CREA: 1.0000 "application " | TOPICAL_CREAM | Freq: Every day | CUTANEOUS | 5 refills | Status: AC

## 2023-02-22 NOTE — Assessment & Plan Note (Signed)
Patient expressed interest in new CPAP alternatives. Current machine is effective. -Continue current CPAP machine.

## 2023-02-22 NOTE — Assessment & Plan Note (Signed)
Discussed importance of healthy weight management Discussed diet and exercise  

## 2023-02-22 NOTE — Assessment & Plan Note (Signed)
Well controlled on Losartan 100mg  daily and Metoprolol XL 25mg  daily. -Continue current regimen.

## 2023-02-22 NOTE — Progress Notes (Signed)
I,Sulibeya S Dimas,acting as a scribe for Shirlee Latch, MD.,have documented all relevant documentation on the behalf of Shirlee Latch, MD,as directed by  Shirlee Latch, MD while in the presence of Shirlee Latch, MD.     Established patient visit   Patient: Ivan Kennedy   DOB: 07/23/1946   77 y.o. Male  MRN: [id number] Visit Date: 02/22/2023  Today's healthcare provider: Shirlee Latch, MD   Chief Complaint  Patient presents with   Medical Management of Chronic Issues   Subjective    HPI  Diabetes Mellitus Type II, follow-up  Lab Results  Component Value Date   HGBA1C 6.2 (A) 07/27/2022   HGBA1C 6.7 (A) 01/02/2022   HGBA1C 8.0 (H) 09/30/2021   Last seen for diabetes 6 months ago.  Management since then includes continue current medications, except decrease Actos to 30 mg daily. He reports excellent compliance with treatment. He is not having side effects.   Home blood sugar records: fasting range: 120-150s  Episodes of hypoglycemia? No    Current insulin regiment: none Most Recent Eye Exam: UTD  --------------------------------------------------------------------------------------------------- Hypertension, follow-up  BP Readings from Last 3 Encounters:  02/22/23 128/79  07/27/22 130/70  06/02/22 136/85   Wt Readings from Last 3 Encounters:  02/22/23 220 lb (99.8 kg)  07/27/22 231 lb (104.8 kg)  06/02/22 229 lb (103.9 kg)     He was last seen for hypertension 6 months ago.  BP at that visit was 130/70 . Management since that visit includes no changes. He reports excellent compliance with treatment. He is not having side effects.     Outside blood pressures are stable.  --------------------------------------------------------------------------------------------------- Lipid/Cholesterol, follow-up  Last Lipid Panel: Lab Results  Component Value Date   CHOL 112 07/27/2022   LDLCALC 45 07/27/2022   HDL 51 07/27/2022   TRIG 76  07/27/2022    He was last seen for this 6 months ago.  Management since that visit includes no changes.  He reports excellent compliance with treatment. He is not having side effects.    Last metabolic panel Lab Results  Component Value Date   GLUCOSE 121 (H) 07/27/2022   NA 145 (H) 07/27/2022   K 3.5 07/27/2022   BUN 16 07/27/2022   CREATININE 1.08 07/27/2022   EGFR 72 07/27/2022   GFRNONAA 73 11/06/2019   CALCIUM 9.5 07/27/2022   AST 24 07/27/2022   ALT 18 07/27/2022   The ASCVD Risk score (Arnett DK, et al., 2019) failed to calculate for the following reasons:   The valid total cholesterol range is 130 to 320 mg/dL  ---------------------------------------------------------------------------------------------------  Discussed the use of AI scribe software for clinical note transcription with the patient, who gave verbal consent to proceed.  History of Present Illness   The patient, with a history of diabetes, hypertension, hyperlipidemia, and rosacea, presents with a new complaint of frequent urination at night. He reports that this symptom started after his last visit approximately six months ago and occurs two to three times a night. He also notes that his urine stream is slow and there is some dribbling at the end. He also reports that he often feels the need to urinate again shortly after emptying his bladder, particularly in the mornings.  The patient's diabetes is managed with Actos 30mg  daily, Jardiance 25mg  daily, and Rybelsus 14mg  daily. He reports that his blood sugar has been well controlled. He also expresses concern about the cost of his diabetes medications, particularly Jardiance and rybelsus - it is affordable  but he is in the donut hole.  The patient's hypertension is managed with losartan 100mg  daily and Metoprolol XL 25mg  daily. He reports that his blood pressure has been well controlled.  The patient's hyperlipidemia is managed with simvastatin 20mg  daily.  He reports that his cholesterol levels have been well controlled.  The patient's rosacea is managed with doxycycline, which he takes once a day and tretinoin cream. He reports that his rosacea symptoms are kept at bay with this medication. He is seeing Dr Darlina Rumpf for Derm  The patient also uses a CPAP machine for sleep apnea and expresses interest in a new device he read about online that would not require him to carry the machine with him.  The patient is due for a colonoscopy, which is scheduled for next month. He also reports that he is due for a shingles shot and a tetanus shot.        Medications: Outpatient Medications Prior to Visit  Medication Sig   ARTIFICIAL TEARS 1 % ophthalmic solution    aspirin EC 81 MG tablet Take 81 mg by mouth daily.    Azelaic Acid 15 % gel Apply topically every morning.   Blood Glucose Monitoring Suppl (ONE TOUCH ULTRA 2) w/Device KIT To check blood sugar once daily   doxycycline (VIBRAMYCIN) 50 MG capsule Take 50 mg by mouth 2 (two) times daily.   erythromycin ophthalmic ointment Apply to sutures 4 times a day for 10-12 days.  Discontinue if allergy develops and call our office   glucose blood (ONETOUCH ULTRA) test strip CHECK BLOOD SUGAR ONCE DAILY   JARDIANCE 25 MG TABS tablet TAKE 1 TABLET BY MOUTH DAILY BEFORE BREAKFAST.   Lancets (ACCU-CHEK SOFT TOUCH) lancets To check blood glucose daily   loratadine (CLARITIN) 10 MG tablet Take 10 mg by mouth daily.   metroNIDAZOLE (METROGEL) 1 % gel Apply 1 application topically every morning.   omeprazole (PRILOSEC) 40 MG capsule TAKE 1 CAPSULE BY MOUTH EVERY DAY   ONETOUCH ULTRA test strip CHECK BLOOD SUGAR ONCE DAILY   pioglitazone (ACTOS) 30 MG tablet TAKE 1 TABLET BY MOUTH EVERY DAY   POLYTRIM ophthalmic solution 1 drop 4 (four) times daily.   simvastatin (ZOCOR) 20 MG tablet Take 1 tablet (20 mg total) by mouth daily at 6 PM.   vitamin B-12 (CYANOCOBALAMIN) 1000 MCG tablet Take 1,000 mcg by mouth daily.    [DISCONTINUED] losartan (COZAAR) 100 MG tablet Take 1 tablet (100 mg total) by mouth daily.   [DISCONTINUED] metoprolol succinate (TOPROL-XL) 25 MG 24 hr tablet TAKE 1 TABLET (25 MG TOTAL) BY MOUTH DAILY.   [DISCONTINUED] RYBELSUS 14 MG TABS TAKE 14 MG (1 TABLET) BY MOUTH DAILY BEFORE BREAKFAST. TAKE ON AN EMPTY STOMACH WITH A SIP OF WATER   [DISCONTINUED] tretinoin (RETIN-A) 0.05 % cream APPLY 1 APPLICATION TOPICALLY AT BEDTIME.   No facility-administered medications prior to visit.    Review of Systems  Constitutional:  Negative for chills and unexpected weight change.  Eyes:  Negative for visual disturbance.  Respiratory:  Negative for chest tightness and shortness of breath.   Cardiovascular:  Positive for leg swelling. Negative for chest pain.  Gastrointestinal:  Negative for abdominal pain, nausea and vomiting.  Neurological:  Negative for dizziness, light-headedness and headaches.       Objective    BP 128/79 (BP Location: Left Arm, Patient Position: Sitting, Cuff Size: Large)   Pulse 66   Temp 98.3 F (36.8 C) (Temporal)   Resp 16  Ht 6\' 1"  (1.854 m)   Wt 220 lb (99.8 kg)   SpO2 97%   BMI 29.03 kg/m    Physical Exam Vitals reviewed.  Constitutional:      General: He is not in acute distress.    Appearance: Normal appearance. He is not diaphoretic.  HENT:     Head: Normocephalic and atraumatic.  Eyes:     General: No scleral icterus.    Conjunctiva/sclera: Conjunctivae normal.  Cardiovascular:     Rate and Rhythm: Normal rate and regular rhythm.     Pulses: Normal pulses.     Heart sounds: Normal heart sounds. No murmur heard. Pulmonary:     Effort: Pulmonary effort is normal. No respiratory distress.     Breath sounds: Normal breath sounds. No wheezing or rhonchi.  Musculoskeletal:     Cervical back: Neck supple.     Right lower leg: No edema.     Left lower leg: No edema.  Lymphadenopathy:     Cervical: No cervical adenopathy.  Skin:    General: Skin  is warm and dry.     Findings: No rash.  Neurological:     Mental Status: He is alert and oriented to person, place, and time. Mental status is at baseline.  Psychiatric:        Mood and Affect: Mood normal.        Behavior: Behavior normal.       No results found for any visits on 02/22/23.  Assessment & Plan     Problem List Items Addressed This Visit       Cardiovascular and Mediastinum   Hypertension associated with diabetes (HCC)    Well controlled on Losartan 100mg  daily and Metoprolol XL 25mg  daily. -Continue current regimen.      Relevant Medications   losartan (COZAAR) 100 MG tablet   metoprolol succinate (TOPROL-XL) 25 MG 24 hr tablet   Semaglutide (RYBELSUS) 14 MG TABS   Other Relevant Orders   Comprehensive metabolic panel     Respiratory   Apnea, sleep    Patient expressed interest in new CPAP alternatives. Current machine is effective. -Continue current CPAP machine.        Endocrine   Diabetes mellitus, type 2 (HCC) - Primary    Well controlled on Rybelsus 14mg  daily, Actos 30mg  daily, and Jardiance 25mg  daily. A1c to be checked today. -Continue current regimen. -Check A1c today. -Consider reducing Actos if A1c remains low.      Relevant Medications   losartan (COZAAR) 100 MG tablet   Semaglutide (RYBELSUS) 14 MG TABS   Other Relevant Orders   Comprehensive metabolic panel   Hemoglobin A1c   Hyperlipidemia associated with type 2 diabetes mellitus (HCC)    Well controlled on Simvastatin 20mg  daily. -Continue current regimen.      Relevant Medications   losartan (COZAAR) 100 MG tablet   metoprolol succinate (TOPROL-XL) 25 MG 24 hr tablet   Semaglutide (RYBELSUS) 14 MG TABS   Other Relevant Orders   Comprehensive metabolic panel     Other   B12 deficiency   Relevant Orders   B12   CBC   Rosacea    Managed by dermatologist. Doxycycline dose recently reduced to once daily. -Continue current regimen.      Obesity    Discussed  importance of healthy weight management Discussed diet and exercise       Relevant Medications   Semaglutide (RYBELSUS) 14 MG TABS   Benign prostatic hyperplasia with nocturia  New onset nocturia, slow urinary stream, and incomplete emptying. Discussed the pathophysiology of BPH and the role of Flomax in improving urinary symptoms. -Start Flomax 0.4mg  daily. -Check PSA today (last checked a few years ago).      Relevant Orders   PSA Total (Reflex To Free)    Assessment and Plan    General Health Maintenance: -Continue baby aspirin daily. -Plan for colonoscopy next month. -Advise patient to get shingles and tetanus vaccines at the pharmacy. -Schedule follow-up visit in December for annual physical and flu vaccine.        Return in about 5 months (around 07/25/2023) for CPE, AWV.      I, Shirlee Latch, MD, have reviewed all documentation for this visit. The documentation on 02/22/23 for the exam, diagnosis, procedures, and orders are all accurate and complete.   Audrianna Driskill, Marzella Schlein, MD, MPH [facility]

## 2023-02-22 NOTE — Addendum Note (Signed)
Addended by: Erasmo Downer on: 02/22/2023 01:35 PM   Modules accepted: Level of Service

## 2023-02-22 NOTE — Assessment & Plan Note (Signed)
Well controlled on Simvastatin 20mg  daily. -Continue current regimen.

## 2023-02-22 NOTE — Assessment & Plan Note (Signed)
Well controlled on Rybelsus 14mg  daily, Actos 30mg  daily, and Jardiance 25mg  daily. A1c to be checked today. -Continue current regimen. -Check A1c today. -Consider reducing Actos if A1c remains low.

## 2023-02-22 NOTE — Assessment & Plan Note (Signed)
Managed by dermatologist. Doxycycline dose recently reduced to once daily. -Continue current regimen.

## 2023-02-22 NOTE — Assessment & Plan Note (Signed)
New onset nocturia, slow urinary stream, and incomplete emptying. Discussed the pathophysiology of BPH and the role of Flomax in improving urinary symptoms. -Start Flomax 0.4mg  daily. -Check PSA today (last checked a few years ago).

## 2023-02-23 LAB — COMPREHENSIVE METABOLIC PANEL
ALT: 19 IU/L (ref 0–44)
AST: 24 IU/L (ref 0–40)
Albumin: 4.3 g/dL (ref 3.8–4.8)
BUN/Creatinine Ratio: 15 (ref 10–24)
Bilirubin Total: 1.2 mg/dL (ref 0.0–1.2)
Calcium: 9.7 mg/dL (ref 8.6–10.2)
Globulin, Total: 1.8 g/dL (ref 1.5–4.5)
Glucose: 115 mg/dL — ABNORMAL HIGH (ref 70–99)
Potassium: 3.7 mmol/L (ref 3.5–5.2)
Total Protein: 6.1 g/dL (ref 6.0–8.5)
eGFR: 74 mL/min/{1.73_m2} (ref >59)

## 2023-02-23 LAB — CBC
MCH: 30.3 pg (ref 26.6–33.0)
MCV: 88 fL (ref 79–97)
RBC: 5.21 x10E6/uL (ref 4.14–5.80)
WBC: 6.1 10*3/uL (ref 3.4–10.8)

## 2023-02-23 LAB — PSA TOTAL (REFLEX TO FREE)

## 2023-02-23 LAB — HEMOGLOBIN A1C: Hgb A1c MFr Bld: 7.1 % — ABNORMAL HIGH (ref 4.8–5.6)

## 2023-02-23 LAB — VITAMIN B12

## 2023-02-24 LAB — COMPREHENSIVE METABOLIC PANEL
Alkaline Phosphatase: 68 IU/L (ref 44–121)
BUN: 16 mg/dL (ref 8–27)
CO2: 24 mmol/L (ref 20–29)
Chloride: 104 mmol/L (ref 96–106)
Creatinine, Ser: 1.05 mg/dL (ref 0.76–1.27)
Sodium: 142 mmol/L (ref 134–144)

## 2023-02-24 LAB — CBC
Hematocrit: 46 % (ref 37.5–51.0)
Hemoglobin: 15.8 g/dL (ref 13.0–17.7)
MCHC: 34.3 g/dL (ref 31.5–35.7)
Platelets: 125 10*3/uL — ABNORMAL LOW (ref 150–450)
RDW: 13.2 % (ref 11.6–15.4)

## 2023-02-24 LAB — HEMOGLOBIN A1C: Est. average glucose Bld gHb Est-mCnc: 157 mg/dL

## 2023-02-24 LAB — PSA TOTAL (REFLEX TO FREE): Prostate Specific Ag, Serum: 3.5 ng/mL (ref 0.0–4.0)

## 2023-03-08 ENCOUNTER — Other Ambulatory Visit: Payer: Self-pay

## 2023-03-08 ENCOUNTER — Ambulatory Visit: Payer: Self-pay

## 2023-03-08 ENCOUNTER — Encounter: Payer: Self-pay | Admitting: Gastroenterology

## 2023-03-08 ENCOUNTER — Ambulatory Visit: Payer: PPO | Admitting: Gastroenterology

## 2023-03-08 VITALS — BP 149/89 | HR 67 | Temp 98.7°F | Ht 73.0 in | Wt 226.0 lb

## 2023-03-08 DIAGNOSIS — Z86010 Personal history of colonic polyps: Secondary | ICD-10-CM | POA: Diagnosis not present

## 2023-03-08 DIAGNOSIS — K219 Gastro-esophageal reflux disease without esophagitis: Secondary | ICD-10-CM | POA: Diagnosis not present

## 2023-03-08 DIAGNOSIS — K5909 Other constipation: Secondary | ICD-10-CM | POA: Diagnosis not present

## 2023-03-08 MED ORDER — NA SULFATE-K SULFATE-MG SULF 17.5-3.13-1.6 GM/177ML PO SOLN: 354.0000 mL | Freq: Once | ORAL | 0 refills | Status: AC

## 2023-03-08 NOTE — Telephone Encounter (Signed)
Chief Complaint: Medication Question Symptoms: None Disposition: [] ED /[] Urgent Care (no appt availability in office) / [] Appointment(In office/virtual)/ []   Virtual Care/ [] Home Care/ [] Refused Recommended Disposition /[]  Mobile Bus/ [x]  Follow-up with PCP Additional Notes: Patient stated at his last office visit he was advised by the provider to stop taking Aspirin 81mg  daily. Patient stated when he received his office visit notes in MyChart it was not mention. Patient would like to verify if he should stop taking Aspirin 81mg  daily. Advised patient that I did not see anything in the chart that advised to stopping taking it. Advised would forward to provider for clarification.   Summary: Medicaiton Advice   Pt is calling to report that he was advised to stop taking the baby Asprin at OV 02/22/23. Pt states that he does not see that in his chart.Under Assessment & Plan Pt is advised to continue to take the baby Asprin. Please advise with Patient.     Reason for Disposition  [1] Caller has NON-URGENT medicine question about med that PCP prescribed AND [2] triager unable to answer question  Answer Assessment - Initial Assessment Questions 1. NAME of MEDICINE: "What medicine(s) are you calling about?"     Aspirin 81mg  daily  2. QUESTION: "What is your question?" (e.g., double dose of medicine, side effect)     Patient would like to know if he was suppose to stop taking the Aspirin 81 mg tablet. 3. PRESCRIBER: "Who prescribed the medicine?" Reason: if prescribed by specialist, call should be referred to that group.     Dr. Beryle Flock 4. SYMPTOMS: "Do you have any symptoms?" If Yes, ask: "What symptoms are you having?"  "How bad are the symptoms (e.g., mild, moderate, severe)     None  Protocols used: Medication Question Call-A-AH

## 2023-03-08 NOTE — Telephone Encounter (Signed)
Ok to stop ASA (place d/c from med list and inform patient. Thanks!

## 2023-03-08 NOTE — Patient Instructions (Signed)
   Please take half a cap daily in your orange juice, coffee or on whatever you drink in the morning.

## 2023-03-08 NOTE — Progress Notes (Addendum)
Wyline Mood MD, MRCP(U.K) [address]  [address]  [place], [place] [address]  Main: [phone]  Fax: [phone]   Gastroenterology Consultation  Referring Provider:     Erasmo Downer, MD Primary Care Physician:  Erasmo Downer, MD Primary Gastroenterologist:  Dr. Wyline Mood  Reason for Consultation:     Stomach problems        HPI:   Ivan Kennedy is a 77 y.o. y/o male referred for consultation & management  by Dr. Beryle Flock, Marzella Schlein, MD.     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 Last colonoscopy 01/19/2020: 6 dimunitive polyps resected that were tubular adenomas.  States currently having episodes of bloating when he does not have a good bowel movement for few days followed by severe diarrhea.  Going on for a year.  No blood in the stool.  No unintentional weight loss.  Does suffer from passage of hard stool.  Long standing history of GERD, well controlled , he would like to get screened for Barrettes esophagus,  02/22/2023: Hb 15.8, CMP normal     Past Medical History:  Diagnosis Date   Colon polyp 2005   Diabetes mellitus (HCC)    GERD (gastroesophageal reflux disease)    Hyperlipidemia    Hypertension    Rosacea    Sleep apnea    CPAP    Past Surgical History:  Procedure Laterality Date   ANAL FISSURE REPAIR  1998   BROW LIFT Bilateral 09/02/2021   Procedure: BLEPHAROPLASTY UPPER EYELID; W/EXCESS SKIN ECTROPION REPAIR, EXTENSIVE WEDGE EXCISION, UPPER BILATERAL;  Surgeon: Imagene Riches, MD;  Location: [facility];  Service: Ophthalmology;  Laterality: Bilateral;  Diabetic  sleep apnea   BROW LIFT Right 06/02/2022   Procedure: BLEPHAROPTOSIS REPAIR; RESECT EX RIGHT;  Surgeon: Imagene Riches, MD;  Location: [facility];  Service: Ophthalmology;  Laterality: Right;  Diabetic   CATARACT EXTRACTION W/PHACO Right 05/25/2021   Procedure: CATARACT EXTRACTION PHACO AND INTRAOCULAR LENS PLACEMENT (IOC) RIGHT DIABETIC  6.64 00:54.7;  Surgeon: Lockie Mola, MD;  Location: [facility];  Service: Ophthalmology;  Laterality: Right;  Diabetic, sleep apnea   CATARACT EXTRACTION W/PHACO Left 06/08/2021   Procedure: CATARACT EXTRACTION PHACO AND INTRAOCULAR LENS PLACEMENT (IOC) LEFT DIABETIC;  Surgeon: Lockie Mola, MD;  Location: [facility];  Service: Ophthalmology;  Laterality: Left;  Diabetic, sleep apnea 7.57 01:02.2   COLONOSCOPY  2005, 2010   COLONOSCOPY WITH PROPOFOL N/A 01/19/2020   Procedure: COLONOSCOPY WITH PROPOFOL;  Surgeon: Wyline Mood, MD;  Location: [facility];  Service: Gastroenterology;  Laterality: N/A;   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 Deeann Saint   shoulder spurs Right 2000    Prior to Admission medications   Medication Sig Start Date End Date Taking? Authorizing Provider  Accu-Chek Softclix Lancets lancets See admin instructions.    [provider]  ARTIFICIAL TEARS 1 % ophthalmic solution  09/05/21   [provider]  aspirin EC 81 MG tablet Take 81 mg by mouth daily.     [provider]  Azelaic Acid 15 % gel Apply topically every morning. 12/28/21   [provider]  Blood Glucose Monitoring Suppl (ONE TOUCH ULTRA 2) w/Device KIT To check blood sugar once daily 03/25/20   Margaretann Loveless, PA-C  doxycycline (VIBRAMYCIN) 50 MG capsule Take 50 mg by mouth 2 (two) times daily.    [provider]  glucose blood (ONETOUCH ULTRA) test  strip CHECK BLOOD SUGAR ONCE DAILY 10/03/21   Bacigalupo, Marzella Schlein, MD  JARDIANCE 25 MG TABS tablet TAKE 1 TABLET BY MOUTH DAILY BEFORE BREAKFAST. 12/25/22   Erasmo Downer, MD  Lancets (ACCU-CHEK SOFT [facility]) lancets To check blood glucose daily 08/22/17   Margaretann Loveless, PA-C  loratadine (CLARITIN) 10 MG tablet Take 10 mg by mouth daily.    [provider]  losartan (COZAAR) 100 MG tablet Take 1 tablet (100 mg total) by mouth daily. 02/22/23   Erasmo Downer, MD  metoprolol succinate (TOPROL-XL) 25 MG 24 hr tablet Take 1 tablet (25 mg total) by mouth daily. 02/22/23   Erasmo Downer, MD  metroNIDAZOLE (METROGEL) 1 % gel Apply 1 application topically every morning. 03/30/15   [provider]  neomycin-polymyxin b-dexamethasone (MAXITROL) 3.5-10000-0.1 OINT Place 1 Application into both eyes at bedtime. 10/12/22   [provider]  omeprazole (PRILOSEC) 40 MG capsule TAKE 1 CAPSULE BY MOUTH EVERY DAY 05/08/22   Caro Laroche, DO  ONETOUCH ULTRA test strip CHECK BLOOD SUGAR ONCE DAILY 10/05/21   Erasmo Downer, MD  pioglitazone (ACTOS) 30 MG tablet TAKE 1 TABLET BY MOUTH EVERY DAY 12/25/22   Bacigalupo, Marzella Schlein, MD  POLYTRIM ophthalmic solution 1 drop 4 (four) times daily. 12/27/21   [provider]  Semaglutide (RYBELSUS) 14 MG TABS TAKE 14 MG (1 TABLET) BY MOUTH DAILY BEFORE BREAKFAST. TAKE ON AN EMPTY STOMACH WITH A SIP OF WATER 02/22/23   Erasmo Downer, MD  simvastatin (ZOCOR) 20 MG tablet Take 1 tablet (20 mg total) by mouth daily at 6 PM. 07/27/22   Bacigalupo, Marzella Schlein, MD  sitaGLIPtin (JANUVIA) 100 MG tablet Take 100 mg by mouth daily.    [provider]  tamsulosin (FLOMAX) 0.4 MG CAPS capsule Take 1 capsule (0.4 mg total) by mouth daily. 02/22/23   Erasmo Downer, MD  tretinoin (RETIN-A) 0.05 % cream Apply 1 application  topically at bedtime. 02/22/23   Erasmo Downer, MD  vitamin B-12 (CYANOCOBALAMIN) 1000 MCG tablet Take 1,000 mcg by mouth daily.    [provider]  XDEMVY 0.25 % SOLN Apply 1 drop to eye 4 (four) times daily. 12/05/22   [provider]    Family History  Problem Relation Age of Onset   Diabetes Mother      Social History   Tobacco Use   Smoking status: Never   Smokeless tobacco: Never  Vaping Use   Vaping status: Never Used  Substance Use Topics   Alcohol use: Yes    Alcohol/week: 0.0 standard drinks of alcohol    Comment:  occasionally- beer 1-2/month   Drug use: No    Allergies as of 03/08/2023   (No Known Allergies)    Review of Systems:    All systems reviewed and negative except where noted in HPI.   Physical Exam:  BP (!) 168/89   Pulse 69   Temp 98.7 F (37.1 C) (Oral)   Ht 6\' 1"  (1.854 m)   Wt 226 lb (102.5 kg)   BMI 29.82 kg/m  No LMP for male patient. Psych:  Alert and cooperative. Normal mood and affect. General:   Alert,  Well-developed, well-nourished, pleasant and cooperative in NAD Head:  Normocephalic and atraumatic. Eyes:  Sclera clear, no icterus.   Conjunctiva pink. .    Neurologic:  Alert and oriented x3;  grossly normal neurologically. Psych:  Alert and cooperative. Normal mood and affect.  Imaging  Studies: No results found.  Assessment and Plan:   NOAM KARAFFA is a 77 y.o. y/o male with a personal history of colon polyps.  Recent change in bowel habits with constipation alternating with diarrhea.GERD well controlled so far   Plan  Surveillance colonoscopy and EGD to screen for Barrettes esophagus.  High-fiber diet advised to consume 25 to 30 g of fiber per day patient information provided.  Commence on daily MiraLAX 1 capful daily if no better come back to see [place]   I have discussed alternative options, risks & benefits,  which include, but are not limited to, bleeding, infection, perforation,respiratory complication & drug reaction.  The patient agrees with this plan & written consent will be obtained.     Follow up in as needed  Dr Wyline Mood MD,MRCP(U.K)

## 2023-03-09 ENCOUNTER — Other Ambulatory Visit: Payer: Self-pay

## 2023-03-09 NOTE — Telephone Encounter (Signed)
Patient advised and medication removed from list

## 2023-04-01 ENCOUNTER — Other Ambulatory Visit: Payer: Self-pay | Admitting: Family Medicine

## 2023-04-03 DIAGNOSIS — H0100A Unspecified blepharitis right eye, upper and lower eyelids: Secondary | ICD-10-CM | POA: Diagnosis not present

## 2023-04-24 ENCOUNTER — Encounter
Admission: RE | Disposition: A | Discharge status: Home / Self Care | Payer: Self-pay | Source: Home / Self Care | Attending: Gastroenterology

## 2023-04-24 ENCOUNTER — Encounter: Payer: Self-pay | Admitting: Gastroenterology

## 2023-04-24 ENCOUNTER — Ambulatory Visit
Admission: RE | Admit: 2023-04-24 | Discharge: 2023-04-24 | Disposition: A | Discharge status: Home / Self Care | Payer: PPO | Attending: Gastroenterology | Admitting: Gastroenterology

## 2023-04-24 ENCOUNTER — Ambulatory Visit: Payer: PPO | Admitting: Certified Registered"

## 2023-04-24 DIAGNOSIS — K5790 Diverticulosis of intestine, part unspecified, without perforation or abscess without bleeding: Secondary | ICD-10-CM | POA: Diagnosis not present

## 2023-04-24 DIAGNOSIS — Z1211 Encounter for screening for malignant neoplasm of colon: Secondary | ICD-10-CM | POA: Insufficient documentation

## 2023-04-24 DIAGNOSIS — Z833 Family history of diabetes mellitus: Secondary | ICD-10-CM | POA: Insufficient documentation

## 2023-04-24 DIAGNOSIS — D122 Benign neoplasm of ascending colon: Secondary | ICD-10-CM | POA: Insufficient documentation

## 2023-04-24 DIAGNOSIS — M1990 Unspecified osteoarthritis, unspecified site: Secondary | ICD-10-CM | POA: Diagnosis not present

## 2023-04-24 DIAGNOSIS — K5909 Other constipation: Secondary | ICD-10-CM

## 2023-04-24 DIAGNOSIS — D126 Benign neoplasm of colon, unspecified: Secondary | ICD-10-CM

## 2023-04-24 DIAGNOSIS — I10 Essential (primary) hypertension: Secondary | ICD-10-CM | POA: Diagnosis not present

## 2023-04-24 DIAGNOSIS — K219 Gastro-esophageal reflux disease without esophagitis: Secondary | ICD-10-CM | POA: Insufficient documentation

## 2023-04-24 DIAGNOSIS — E119 Type 2 diabetes mellitus without complications: Secondary | ICD-10-CM | POA: Insufficient documentation

## 2023-04-24 DIAGNOSIS — Z860100 Personal history of colon polyps, unspecified: Secondary | ICD-10-CM

## 2023-04-24 DIAGNOSIS — Z7984 Long term (current) use of oral hypoglycemic drugs: Secondary | ICD-10-CM | POA: Insufficient documentation

## 2023-04-24 DIAGNOSIS — K640 First degree hemorrhoids: Secondary | ICD-10-CM | POA: Diagnosis not present

## 2023-04-24 DIAGNOSIS — Z13810 Encounter for screening for upper gastrointestinal disorder: Secondary | ICD-10-CM | POA: Diagnosis not present

## 2023-04-24 DIAGNOSIS — Z79899 Other long term (current) drug therapy: Secondary | ICD-10-CM | POA: Insufficient documentation

## 2023-04-24 DIAGNOSIS — K635 Polyp of colon: Secondary | ICD-10-CM | POA: Diagnosis not present

## 2023-04-24 DIAGNOSIS — G4730 Sleep apnea, unspecified: Secondary | ICD-10-CM | POA: Insufficient documentation

## 2023-04-24 DIAGNOSIS — K5730 Diverticulosis of large intestine without perforation or abscess without bleeding: Secondary | ICD-10-CM | POA: Diagnosis not present

## 2023-04-24 DIAGNOSIS — K449 Diaphragmatic hernia without obstruction or gangrene: Secondary | ICD-10-CM | POA: Insufficient documentation

## 2023-04-24 HISTORY — PX: COLONOSCOPY WITH PROPOFOL: SHX5780

## 2023-04-24 HISTORY — PX: POLYPECTOMY: SHX5525

## 2023-04-24 HISTORY — PX: ESOPHAGOGASTRODUODENOSCOPY (EGD) WITH PROPOFOL: SHX5813

## 2023-04-24 LAB — GLUCOSE, CAPILLARY: Glucose-Capillary: 140 mg/dL — ABNORMAL HIGH (ref 70–99)

## 2023-04-24 SURGERY — ESOPHAGOGASTRODUODENOSCOPY (EGD) WITH PROPOFOL
Anesthesia: General

## 2023-04-24 MED ORDER — STERILE WATER FOR IRRIGATION IR SOLN
Status: DC | PRN
  Administered 2023-04-24: 30 mL

## 2023-04-24 MED ORDER — SODIUM CHLORIDE 0.9 % IV SOLN: INTRAVENOUS | Status: DC

## 2023-04-24 MED ORDER — LIDOCAINE HCL (CARDIAC) PF 100 MG/5ML IV SOSY
PREFILLED_SYRINGE | INTRAVENOUS | Status: DC | PRN
  Administered 2023-04-24: 50 mg via INTRAVENOUS

## 2023-04-24 MED ORDER — PROPOFOL 500 MG/50ML IV EMUL
INTRAVENOUS | Status: DC | PRN
  Administered 2023-04-24: 150 ug/kg/min via INTRAVENOUS

## 2023-04-24 MED ORDER — PROPOFOL 10 MG/ML IV BOLUS
INTRAVENOUS | Status: DC | PRN
  Administered 2023-04-24: 70 mg via INTRAVENOUS

## 2023-04-24 MED ORDER — GLYCOPYRROLATE 0.2 MG/ML IJ SOLN
INTRAMUSCULAR | Status: DC | PRN
  Administered 2023-04-24: .2 mg via INTRAVENOUS

## 2023-04-24 NOTE — Op Note (Signed)
[facility] Gastroenterology Patient Name: Ivan Kennedy Procedure Date: 04/24/2023 8:51 AM MRN: [id number] Account #: [id number] Date of Birth: 1946-04-21 Admit Type: Outpatient Age: 77 Room: [facility] ENDO ROOM 2 Gender: Male Note Status: Finalized Instrument Name: Prentice Docker [id number] Procedure:             Colonoscopy Indications:           Surveillance: Personal history of adenomatous polyps                         on last colonoscopy > 3 years ago, Last colonoscopy:                         June 2021 Providers:             Wyline Mood MD, MD Referring MD:          Marzella Schlein. Bacigalupo (Referring MD) Medicines:             Monitored Anesthesia Care Complications:         No immediate complications. Procedure:             Pre-Anesthesia Assessment:                        - Prior to the procedure, a History and Physical was                         performed, and patient medications, allergies and                         sensitivities were reviewed. The patient's tolerance                         of previous anesthesia was reviewed.                        - The risks and benefits of the procedure and the                         sedation options and risks were discussed with the                         patient. All questions were answered and informed                         consent was obtained.                        - ASA Grade Assessment: II - A patient with mild                         systemic disease.                        After obtaining informed consent, the colonoscope was                         passed under direct vision. Throughout the procedure,                         the patient's blood  pressure, pulse, and oxygen                         saturations were monitored continuously. The                         Colonoscope was introduced through the anus and                         advanced to the the cecum, identified by the                          appendiceal orifice. The colonoscopy was performed                         with ease. The patient tolerated the procedure well.                         The quality of the bowel preparation was excellent.                         The ileocecal valve, appendiceal orifice, and rectum                         were photographed. Findings:      Six sessile polyps were found in the ascending colon. The polyps were 4       to 5 mm in size. These polyps were removed with a cold snare. Resection       and retrieval were complete.      Multiple small-mouthed diverticula were found in the sigmoid colon.      Non-bleeding internal hemorrhoids were found during retroflexion. The       hemorrhoids were large and Grade I (internal hemorrhoids that do not       prolapse).      The exam was otherwise without abnormality on direct and retroflexion       views. Impression:            - Six 4 to 5 mm polyps in the ascending colon, removed                         with a cold snare. Resected and retrieved.                        - Diverticulosis in the sigmoid colon.                        - Non-bleeding internal hemorrhoids.                        - The examination was otherwise normal on direct and                         retroflexion views. Recommendation:        - Discharge patient to home (with escort).                        - Resume previous diet.                        -  Continue present medications.                        - Await pathology results.                        - Repeat colonoscopy is not recommended due to current                         age (44 years or older) for surveillance based on                         pathology results. Procedure Code(s):     --- Professional ---                        [phone], Colonoscopy, flexible; with removal of                         tumor(s), polyp(s), or other lesion(s) by snare                         technique Diagnosis Code(s):     --- Professional ---                         Z86.010, Personal history of colonic polyps                        D12.2, Benign neoplasm of ascending colon                        K64.0, First degree hemorrhoids                        K57.30, Diverticulosis of large intestine without                         perforation or abscess without bleeding CPT copyright 2022 American Medical Association. All rights reserved. The codes documented in this report are preliminary and upon coder review may  be revised to meet current compliance requirements. Wyline Mood, MD Wyline Mood MD, MD 04/24/2023 9:30:45 AM This report has been signed electronically. Number of Addenda: 0 Note Initiated On: 04/24/2023 8:51 AM Scope Withdrawal Time: 0 hours 14 minutes 58 seconds  Total Procedure Duration: 0 hours 18 minutes 25 seconds  Estimated Blood Loss:  Estimated blood loss: none.      [facility]

## 2023-04-24 NOTE — Anesthesia Preprocedure Evaluation (Signed)
Anesthesia Evaluation  Patient identified by MRN, date of birth, ID band Patient awake    Reviewed: Allergy & Precautions, NPO status , Patient's Chart, lab work & pertinent test results, reviewed documented beta blocker date and time   History of Anesthesia Complications Negative for: history of anesthetic complications  Airway Mallampati: III  TM Distance: >3 FB Neck ROM: full    Dental  (+) Caps, Dental Advidsory Given   Pulmonary neg shortness of breath, sleep apnea and Continuous Positive Airway Pressure Ventilation , neg COPD, neg recent URI   Pulmonary exam normal        Cardiovascular hypertension, Pt. on home beta blockers and Pt. on medications (-) angina (-) Past MI and (-) Cardiac Stents Normal cardiovascular exam(-) dysrhythmias (-) Valvular Problems/Murmurs     Neuro/Psych negative neurological ROS  negative psych ROS   GI/Hepatic Neg liver ROS,GERD  Controlled and Medicated,,  Endo/Other  diabetes, Type 2, Oral Hypoglycemic Agents    Renal/GU negative Renal ROS  negative genitourinary   Musculoskeletal  (+) Arthritis ,    Abdominal Normal abdominal exam  (+)   Peds  Hematology negative hematology ROS (+)   Anesthesia Other Findings Past Medical History: 2005: Colon polyp No date: Diabetes mellitus (HCC) No date: GERD (gastroesophageal reflux disease) No date: Hyperlipidemia No date: Hypertension No date: Rosacea No date: Sleep apnea     Comment:  CPAP  Past Surgical History: 1998: ANAL FISSURE REPAIR 09/02/2021: BROW LIFT; Bilateral     Comment:  Procedure: BLEPHAROPLASTY UPPER EYELID; W/EXCESS SKIN               ECTROPION REPAIR, EXTENSIVE WEDGE EXCISION, UPPER               BILATERAL;  Surgeon: Imagene Riches, MD;  Location: [facility]              SURGERY [facility];  Service: Ophthalmology;  Laterality:               Bilateral;  Diabetic  sleep apnea 05/25/2021: CATARACT EXTRACTION W/PHACO; Right      Comment:  Procedure: CATARACT EXTRACTION PHACO AND INTRAOCULAR               LENS PLACEMENT (IOC) RIGHT DIABETIC 6.64 00:54.7;                Surgeon: Lockie Mola, MD;  Location: [facility]               SURGERY CNTR;  Service: Ophthalmology;  Laterality:               Right;  Diabetic, sleep apnea 06/08/2021: CATARACT EXTRACTION W/PHACO; Left     Comment:  Procedure: CATARACT EXTRACTION PHACO AND INTRAOCULAR               LENS PLACEMENT (IOC) LEFT DIABETIC;  Surgeon: Lockie Mola, MD;  Location: [facility] SUR[facility]ERY [facility];  Service:               Ophthalmology;  Laterality: Left;  Diabetic, sleep               apnea 7.57 01:02.2 2005, 2010: COLONOSCOPY 01/19/2020: COLONOSCOPY WITH PROPOFOL; N/A     Comment:  Procedure: COLONOSCOPY WITH PROPOFOL;  Surgeon: Wyline Mood, MD;  Location: [facility] ENDOSCOPY;  Service:  Gastroenterology;  Laterality: N/A; 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 Deeann Saint 2000: shoulder spurs; Right  BMI    Body Mass Index: 30.34 kg/m      Reproductive/Obstetrics negative OB ROS                             Anesthesia Physical Anesthesia Plan  ASA: 2  Anesthesia Plan: General   Post-op Pain Management:    Induction: Intravenous  PONV Risk Score and Plan: 2 and Propofol infusion and TIVA  Airway Management Planned: Natural Airway and Nasal Cannula  Additional Equipment:   Intra-op Plan:   Post-operative Plan:   Informed Consent: I have reviewed the patients History and Physical, chart, labs and discussed the procedure including the risks, benefits and alternatives for the proposed anesthesia with the patient or authorized representative who has indicated his/her understanding and acceptance.       Plan Discussed with: Anesthesiologist, CRNA and Surgeon  Anesthesia Plan Comments:         Anesthesia Quick Evaluation

## 2023-04-24 NOTE — Op Note (Addendum)
[facility] Gastroenterology Patient Name: Ivan Kennedy Procedure Date: 04/24/2023 8:56 AM MRN: [id number] Account #: [id number] Date of Birth: 1946-03-29 Admit Type: Outpatient Age: 77 Room: [facility] ENDO ROOM 2 Gender: Male Note Status: Finalized Instrument Name: Patton Salles Endoscope [id number] Procedure:             Upper GI endoscopy Indications:           Screening for Barrett's esophagus Providers:             Wyline Mood MD, MD Referring MD:          Marzella Schlein. Bacigalupo (Referring MD) Medicines:             Monitored Anesthesia Care Complications:         No immediate complications. Procedure:             Pre-Anesthesia Assessment:                        - Prior to the procedure, a History and Physical was                         performed, and patient medications, allergies and                         sensitivities were reviewed. The patient's tolerance                         of previous anesthesia was reviewed.                        - The risks and benefits of the procedure and the                         sedation options and risks were discussed with the                         patient. All questions were answered and informed                         consent was obtained.                        - ASA Grade Assessment: II - A patient with mild                         systemic disease.                        After obtaining informed consent, the endoscope was                         passed under direct vision. Throughout the procedure,                         the patient's blood pressure, pulse, and oxygen                         saturations were monitored continuously. The Endoscope  was introduced through the mouth, and advanced to the                         third part of duodenum. The upper GI endoscopy was                         accomplished with ease. The patient tolerated the                         procedure well. Findings:       The esophagus was normal.      The examined duodenum was normal.      The gastroesophageal flap valve was visualized endoscopically and       classified as Hill Grade III (minimal fold, loose to endoscope, hiatal       hernia likely). Impression:            - Normal esophagus.                        - Normal examined duodenum.                        - Gastroesophageal flap valve classified as Hill Grade                         III (minimal fold, loose to endoscope, hiatal hernia                         likely).                        - No specimens collected. Recommendation:        - Perform a colonoscopy today. Procedure Code(s):     --- Professional ---                        [phone], Esophagogastroduodenoscopy, flexible,                         transoral; diagnostic, including collection of                         specimen(s) by brushing or washing, when performed                         (separate procedure) Diagnosis Code(s):     --- Professional ---                        Z13.810, Encounter for screening for upper                         gastrointestinal disorder CPT copyright 2022 American Medical Association. All rights reserved. The codes documented in this report are preliminary and upon coder review may  be revised to meet current compliance requirements. Wyline Mood, MD Wyline Mood MD, MD 04/24/2023 9:08:09 AM This report has been signed electronically. Number of Addenda: 0 Note Initiated On: 04/24/2023 8:56 AM Estimated Blood Loss:  Estimated blood loss: none.      [facility]

## 2023-04-24 NOTE — Transfer of Care (Signed)
Immediate Anesthesia Transfer of Care Note  Patient: Ivan Kennedy  Procedure(s) Performed: ESOPHAGOGASTRODUODENOSCOPY (EGD) WITH PROPOFOL COLONOSCOPY WITH PROPOFOL POLYPECTOMY  Patient Location: PACU and Endoscopy Unit  Anesthesia Type:General  Level of Consciousness: drowsy and patient cooperative  Airway & Oxygen Therapy: Patient Spontanous Breathing and Patient connected to face mask oxygen  Post-op Assessment: Report given to RN and Post -op Vital signs reviewed and stable  Post vital signs: Reviewed and stable  Last Vitals:  Vitals Value Taken Time  BP 112/72 04/24/23 0931  Temp 36.5 C 04/24/23 0930  Pulse 68 04/24/23 0932  Resp 16 04/24/23 0932  SpO2 94 % 04/24/23 0932  Vitals shown include unfiled device data.  Last Pain:  Vitals:   04/24/23 0930  TempSrc: Temporal  PainSc: Asleep         Complications: No notable events documented.

## 2023-04-24 NOTE — H&P (Signed)
Wyline Mood, MD [address], [place], [place], [address] [address], [address], [place], [place], [address] Phone: [phone]  Fax: [phone]  Primary Care Physician:  Erasmo Downer, MD   Pre-Procedure History & Physical: HPI:  Ivan Kennedy is a 77 y.o. male is here for an endoscopy and colonoscopy    Past Medical History:  Diagnosis Date   Colon polyp 2005   Diabetes mellitus (HCC)    GERD (gastroesophageal reflux disease)    Hyperlipidemia    Hypertension    Rosacea    Sleep apnea    CPAP    Past Surgical History:  Procedure Laterality Date   ANAL FISSURE REPAIR  1998   BROW LIFT Bilateral 09/02/2021   Procedure: BLEPHAROPLASTY UPPER EYELID; W/EXCESS SKIN ECTROPION REPAIR, EXTENSIVE WEDGE EXCISION, UPPER BILATERAL;  Surgeon: Imagene Riches, MD;  Location: [facility] SURGERY [facility];  Service: Ophthalmology;  Laterality: Bilateral;  Diabetic  sleep apnea   BROW LIFT Right 06/02/2022   Procedure: BLEPHAROPTOSIS REPAIR; RESECT EX RIGHT;  Surgeon: Imagene Riches, MD;  Location: [facility];  Service: Ophthalmology;  Laterality: Right;  Diabetic   CATARACT EXTRACTION W/PHACO Right 05/25/2021   Procedure: CATARACT EXTRACTION PHACO AND INTRAOCULAR LENS PLACEMENT (IOC) RIGHT DIABETIC 6.64 00:54.7;  Surgeon: Lockie Mola, MD;  Location: [facility];  Service: Ophthalmology;  Laterality: Right;  Diabetic, sleep apnea   CATARACT EXTRACTION W/PHACO Left 06/08/2021   Procedure: CATARACT EXTRACTION PHACO AND INTRAOCULAR LENS PLACEMENT (IOC) LEFT DIABETIC;  Surgeon: Lockie Mola, MD;  Location: [facility] SURGERY [facility]TR;  Service: Ophthalmology;  Laterality: Left;  Diabetic, sleep apnea 7.57 01:02.2   COLONOSCOPY  2005, 2010   COLONOSCOPY WITH PROPOFOL N/A 01/19/2020   Procedure: COLONOSCOPY WITH PROPOFOL;  Surgeon: Wyline Mood, MD;  Location: [facility] ENDOSCOPY;  Service: Gastroenterology;  Laterality: N/A;   REPLACEMENT TOTAL KNEE Right  07/2007   Dr. Deeann Saint   shoulder spurs Right 2000    Prior to Admission medications   Medication Sig Start Date End Date Taking? Authorizing Provider  JARDIANCE 25 MG TABS tablet TAKE 1 TABLET BY MOUTH DAILY BEFORE BREAKFAST. 12/25/22  Yes Bacigalupo, Marzella Schlein, MD  loratadine (CLARITIN) 10 MG tablet Take 10 mg by mouth daily.   Yes [provider]  losartan (COZAAR) 100 MG tablet Take 1 tablet (100 mg total) by mouth daily. 02/22/23  Yes Bacigalupo, Marzella Schlein, MD  metoprolol succinate (TOPROL-XL) 25 MG 24 hr tablet Take 1 tablet (25 mg total) by mouth daily. 02/22/23  Yes Bacigalupo, Marzella Schlein, MD  vitamin B-12 (CYANOCOBALAMIN) 1000 MCG tablet Take 1,000 mcg by mouth daily.   Yes [provider]  Accu-Chek Softclix Lancets lancets See admin instructions.    [provider]  ARTIFICIAL TEARS 1 % ophthalmic solution  09/05/21   [provider]  Azelaic Acid 15 % gel Apply topically every morning. 12/28/21   [provider]  Blood Glucose Monitoring Suppl (ONE TOUCH ULTRA 2) w/Device KIT To check blood sugar once daily 03/25/20   Margaretann Loveless, PA-C  doxycycline (VIBRAMYCIN) 50 MG capsule Take 50 mg by mouth 2 (two) times daily.    [provider]  glucose blood (ONETOUCH ULTRA) test strip CHECK BLOOD SUGAR ONCE DAILY 10/03/21   Erasmo Downer, MD  Lancets (ACCU-CHEK SOFT [facility]) lancets To check blood glucose daily 08/22/17   Margaretann Loveless, PA-C  metroNIDAZOLE (METROGEL) 1 % gel Apply 1 application topically every morning. 03/30/15  [provider]  neomycin-polymyxin b-dexamethasone (MAXITROL) 3.5-10000-0.1 OINT Place 1 Application into both eyes at bedtime. Patient not taking: Reported on 03/08/2023 10/12/22   [provider]  omeprazole (PRILOSEC) 40 MG capsule TAKE 1 CAPSULE BY MOUTH EVERY DAY 05/08/22   Caro Laroche, DO  ONETOUCH ULTRA test strip CHECK BLOOD SUGAR ONCE DAILY 10/05/21   Erasmo Downer, MD  pioglitazone (ACTOS) 30 MG tablet TAKE 1 TABLET BY MOUTH EVERY DAY 12/25/22   Bacigalupo, Marzella Schlein, MD  POLYTRIM ophthalmic solution 1 drop 4 (four) times daily. 12/27/21   [provider]  Semaglutide (RYBELSUS) 14 MG TABS TAKE 14 MG (1 TABLET) BY MOUTH DAILY BEFORE BREAKFAST. TAKE ON AN EMPTY STOMACH WITH A SIP OF WATER 02/22/23   Erasmo Downer, MD  simvastatin (ZOCOR) 20 MG tablet Take 1 tablet (20 mg total) by mouth daily at 6 PM. 07/27/22   Bacigalupo, Marzella Schlein, MD  sitaGLIPtin (JANUVIA) 100 MG tablet Take 100 mg by mouth daily.    [provider]  tamsulosin (FLOMAX) 0.4 MG CAPS capsule TAKE 1 CAPSULE BY MOUTH EVERY DAY 04/02/23   Bacigalupo, Marzella Schlein, MD  tretinoin (RETIN-A) 0.05 % cream Apply 1 application  topically at bedtime. 02/22/23   Bacigalupo, Marzella Schlein, MD  XDEMVY 0.25 % SOLN Apply 1 drop to eye 4 (four) times daily. 12/05/22   [provider]    Allergies as of 03/08/2023   (No Known Allergies)    Family History  Problem Relation Age of Onset   Diabetes Mother     Social History   Socioeconomic History   Marital status: Married    Spouse name: Not on file   Number of children: 1   Years of education: Not on file   Highest education level: GED or equivalent  Occupational History   Occupation: retired  Tobacco Use   Smoking status: Never   Smokeless tobacco: Never  Vaping Use   Vaping status: Never Used  Substance and Sexual Activity   Alcohol use: Yes    Alcohol/week: 0.0 standard drinks of alcohol    Comment: occasionally- beer 1-2/month   Drug use: No   Sexual activity: Not on file  Other Topics Concern   Not on file  Social History Narrative   Not on file   Social Determinants of Health   Financial Resource Strain: Low Risk  (12/27/2021)   Overall Financial Resource Strain (CARDIA)    Difficulty of Paying Living Expenses: Not hard at all  Food Insecurity: No Food Insecurity (12/27/2021)   Hunger Vital Sign     Worried About Running Out of Food in the Last Year: Never true    Ran Out of Food in the Last Year: Never true  Transportation Needs: No Transportation Needs (12/27/2021)   PRAPARE - Administrator, Civil Service (Medical): No    Lack of Transportation (Non-Medical): No  Physical Activity: Insufficiently Active (12/27/2021)   Exercise Vital Sign    Days of Exercise per Week: 3 days    Minutes of Exercise per Session: 30 min  Stress: No Stress Concern Present (12/27/2021)   [employer] of Occupational Health - Occupational Stress Questionnaire    Feeling of Stress : Not at all  Social Connections: Socially Integrated (12/27/2021)   Social Connection and Isolation Panel [NHANES]    Frequency of Communication with Friends and Family: Never    Frequency of Social Gatherings with Friends and Family: More than three times a  week    Attends Religious Services: More than 4 times per year    Active Member of Clubs or Organizations: Yes    Attends Banker Meetings: More than 4 times per year    Marital Status: Married  Catering manager Violence: Not At Risk (12/27/2021)   Humiliation, Afraid, Rape, and Kick questionnaire    Fear of Current or Ex-Partner: No    Emotionally Abused: No    Physically Abused: No    Sexually Abused: No    Review of Systems: See HPI, otherwise negative ROS  Physical Exam: BP (!) 161/90   Pulse 66   Temp (!) 97.2 F (36.2 C) (Temporal)   Resp 18   Ht 6\' 1"  (1.854 m)   Wt 99.3 kg   SpO2 100%   BMI 28.89 kg/m  General:   Alert,  pleasant and cooperative in NAD Head:  Normocephalic and atraumatic. Neck:  Supple; no masses or thyromegaly. Lungs:  Clear throughout to auscultation, normal respiratory effort.    Heart:  +S1, +S2, Regular rate and rhythm, No edema. Abdomen:  Soft, nontender and nondistended. Normal bowel sounds, without guarding, and without rebound.   Neurologic:  Alert and  oriented x4;  grossly normal  neurologically.  Impression/Plan: Ivan Kennedy is here for an endoscopy and colonoscopy  to be performed for  evaluation of colon cancer screening and barrettes esophagus    Risks, benefits, limitations, and alternatives regarding endoscopy have been reviewed with the patient.  Questions have been answered.  All parties agreeable.   Wyline Mood, MD  04/24/2023, 8:17 AM

## 2023-04-25 ENCOUNTER — Encounter: Payer: Self-pay | Admitting: Gastroenterology

## 2023-04-25 LAB — SURGICAL PATHOLOGY

## 2023-04-26 ENCOUNTER — Encounter: Payer: Self-pay | Admitting: Gastroenterology

## 2023-05-01 ENCOUNTER — Ambulatory Visit: Payer: Self-pay | Admitting: *Deleted

## 2023-05-01 NOTE — Telephone Encounter (Signed)
Summary: High Blood sugar   Patient has called and stated he had a colonoscopy done and was taken off of his medications 6 days prior. He started back on his medications on 04/25/2023 and stated his blood sugar has been crazy, he can not get it back right .He did not know if it is because he was taken off his medication for a while or what. Patient would like a call back regarding this and what to do about it. No symptoms.   Patient wants to know how to get blood sugar stable again.   Latest Blood Sugar reading was 193  Please advise. Patients callback #[phone]         Reason for Disposition . Blood glucose 70-240 mg/dL (3.9 -08.6 mmol/L)  Answer Assessment - Initial Assessment Questions 1. BLOOD GLUCOSE: "What is your blood glucose level?"      193 this morning- fasting, 180- yesterday 2. ONSET: "When did you check the blood glucose?"     Been getting higher and higher since procedure 3. USUAL RANGE: "What is your glucose level usually?" (e.g., usual fasting morning value, usual evening value)     130-150- before procedure 4. KETONES: "Do you check for ketones (urine or blood test strips)?" If Yes, ask: "What does the test show now?"      na 5. TYPE 1 or 2:  "Do you know what type of diabetes you have?"  (e.g., Type 1, Type 2, Gestational; doesn't know)      Type 2 6. INSULIN: "Do you take insulin?" "What type of insulin(s) do you use? What is the mode of delivery? (syringe, pen; injection or pump)?"      *No Answer* 7. DIABETES PILLS: "Do you take any pills for your diabetes?" If Yes, ask: "Have you missed taking any pills recently?"     Rybelsus, januvia  8. OTHER SYMPTOMS: "Do you have any symptoms?" (e.g., fever, frequent urination, difficulty breathing, dizziness, weakness, vomiting)     no  Protocols used: Diabetes - High Blood Sugar-A-AH

## 2023-05-01 NOTE — Telephone Encounter (Signed)
  Chief Complaint: elevated glucose since procedure 9/11 Symptoms: none- fasting glucose is elevated Frequency: started 9/11 Pertinent Negatives: Patient denies fever, frequent urination, difficulty breathing, dizziness, weakness, vomiting Disposition: [] ED /[] Urgent Care (no appt availability in office) / [] Appointment(In office/virtual)/ []  [facility]/ [] Home Care/ [] Refused Recommended Disposition /[]  Mobile Bus/ [x]  Follow-up with PCP Additional Notes: Patient states he is having elevated fasting glucose readings since his procedure- 9/11. Patient has increased fluid intake and is taking prescribed medications - but his numbers are continuing to be elevated. Patient would like to get advice from provider on what else he can do- also how long to expect these elevated readings.

## 2023-05-01 NOTE — Telephone Encounter (Signed)
It's going to take 1-2 weeks for it to settle back out. Make sure that he resumed all of his DM meds.  Watch carbohydrate intake and hydrate well.

## 2023-05-01 NOTE — Telephone Encounter (Signed)
Patient made aware of providers message.

## 2023-05-02 NOTE — Anesthesia Postprocedure Evaluation (Signed)
Anesthesia Post Note  Patient: Ivan Kennedy  Procedure(s) Performed: ESOPHAGOGASTRODUODENOSCOPY (EGD) WITH PROPOFOL COLONOSCOPY WITH PROPOFOL POLYPECTOMY  Patient location during evaluation: Endoscopy Anesthesia Type: General Level of consciousness: awake and alert Pain management: pain level controlled Vital Signs Assessment: post-procedure vital signs reviewed and stable Respiratory status: spontaneous breathing, nonlabored ventilation, respiratory function stable and patient connected to nasal cannula oxygen Cardiovascular status: blood pressure returned to baseline and stable Postop Assessment: no apparent nausea or vomiting Anesthetic complications: no   No notable events documented.   Last Vitals:  Vitals:   04/24/23 0930 04/24/23 0940  BP: 112/72 106/69  Pulse: 82   Resp: 15   Temp: 36.5 C   SpO2: 94%     Last Pain:  Vitals:   04/25/23 0741  TempSrc:   PainSc: 0-No pain                 Lenard Simmer

## 2023-05-15 ENCOUNTER — Other Ambulatory Visit: Payer: Self-pay | Admitting: Family Medicine

## 2023-05-15 DIAGNOSIS — K219 Gastro-esophageal reflux disease without esophagitis: Secondary | ICD-10-CM

## 2023-07-18 ENCOUNTER — Other Ambulatory Visit: Payer: Self-pay | Admitting: Family Medicine

## 2023-07-18 DIAGNOSIS — E1165 Type 2 diabetes mellitus with hyperglycemia: Secondary | ICD-10-CM

## 2023-07-19 NOTE — Telephone Encounter (Signed)
Requested Prescriptions  Pending Prescriptions Disp Refills   pioglitazone (ACTOS) 30 MG tablet [Pharmacy Med Name: PIOGLITAZONE HCL 30 MG TABLET] 90 tablet 0    Sig: TAKE 1 TABLET BY MOUTH EVERY DAY     Endocrinology:  Diabetes - Glitazones - pioglitazone Passed - 07/18/2023  4:07 AM      Passed - HBA1C is between 0 and 7.9 and within 180 days    Hgb A1c MFr Bld  Date Value Ref Range Status  02/22/2023 7.1 (H) 4.8 - 5.6 % Final    Comment:             Prediabetes: 5.7 - 6.4          Diabetes: >6.4          Glycemic control for adults with diabetes: <7.0          Passed - Valid encounter within last 6 months    Recent Outpatient Visits           4 months ago Type 2 diabetes mellitus with hyperglycemia, without long-term current use of insulin [facility])   [place] [facility] [place], Marzella Schlein, MD   11 months ago Hypertension associated with diabetes [facility])   [place] [facility] [place], Marzella Schlein, MD   1 year ago Hypertension associated with diabetes [facility])   [place] [facility] [place], Marzella Schlein, MD   1 year ago Encounter for Medicare annual wellness exam   [place] [facility] [place], Hilbert Corrigan S, LPN   1 year ago Type 2 diabetes mellitus with other specified complication, without long-term current use of insulin [facility])   [place] [facility] [place], Marzella Schlein, MD       Future Appointments             In 2 weeks [place], Marzella Schlein, MD [facility]

## 2023-08-02 ENCOUNTER — Ambulatory Visit: Payer: PPO | Admitting: Family Medicine

## 2023-08-02 ENCOUNTER — Encounter: Payer: Self-pay | Admitting: Family Medicine

## 2023-08-02 VITALS — BP 135/84 | HR 64 | Ht 73.0 in | Wt 226.1 lb

## 2023-08-02 DIAGNOSIS — E1165 Type 2 diabetes mellitus with hyperglycemia: Secondary | ICD-10-CM

## 2023-08-02 DIAGNOSIS — E1159 Type 2 diabetes mellitus with other circulatory complications: Secondary | ICD-10-CM

## 2023-08-02 DIAGNOSIS — Z23 Encounter for immunization: Secondary | ICD-10-CM | POA: Diagnosis not present

## 2023-08-02 DIAGNOSIS — E785 Hyperlipidemia, unspecified: Secondary | ICD-10-CM

## 2023-08-02 DIAGNOSIS — R351 Nocturia: Secondary | ICD-10-CM

## 2023-08-02 DIAGNOSIS — Z0001 Encounter for general adult medical examination with abnormal findings: Secondary | ICD-10-CM | POA: Diagnosis not present

## 2023-08-02 DIAGNOSIS — E119 Type 2 diabetes mellitus without complications: Secondary | ICD-10-CM

## 2023-08-02 DIAGNOSIS — Z7984 Long term (current) use of oral hypoglycemic drugs: Secondary | ICD-10-CM

## 2023-08-02 DIAGNOSIS — E1169 Type 2 diabetes mellitus with other specified complication: Secondary | ICD-10-CM | POA: Diagnosis not present

## 2023-08-02 DIAGNOSIS — Z0000 Encounter for general adult medical examination without abnormal findings: Secondary | ICD-10-CM | POA: Diagnosis not present

## 2023-08-02 DIAGNOSIS — I152 Hypertension secondary to endocrine disorders: Secondary | ICD-10-CM | POA: Diagnosis not present

## 2023-08-02 DIAGNOSIS — N401 Enlarged prostate with lower urinary tract symptoms: Secondary | ICD-10-CM

## 2023-08-02 DIAGNOSIS — E7800 Pure hypercholesterolemia, unspecified: Secondary | ICD-10-CM

## 2023-08-02 MED ORDER — RYBELSUS 14 MG PO TABS: ORAL_TABLET | ORAL | 1 refills | Status: DC

## 2023-08-02 MED ORDER — SIMVASTATIN 20 MG PO TABS
20.0000 mg | ORAL_TABLET | Freq: Every day | ORAL | 3 refills | Status: DC
Start: 2023-08-02 — End: 2023-08-09

## 2023-08-02 MED ORDER — PIOGLITAZONE HCL 30 MG PO TABS
30.0000 mg | ORAL_TABLET | Freq: Every day | ORAL | 1 refills | Status: DC
Start: 2023-08-02 — End: 2024-03-04

## 2023-08-02 MED ORDER — TAMSULOSIN HCL 0.4 MG PO CAPS: 0.8000 mg | ORAL_CAPSULE | Freq: Every day | ORAL | 1 refills | Status: DC

## 2023-08-02 MED ORDER — EMPAGLIFLOZIN 25 MG PO TABS: 25.0000 mg | ORAL_TABLET | Freq: Every day | ORAL | 1 refills | Status: DC

## 2023-08-02 NOTE — Assessment & Plan Note (Signed)
Reports nocturia three times per night despite current Flomax dose. Discussed increasing dose to improve symptoms. Explained Flomax's role in decreasing prostate size. - Increase Flomax to 0.8 mg daily

## 2023-08-02 NOTE — Assessment & Plan Note (Signed)
Managed with Jardiance, Actos, and Rybelsus. Reports loose stools potentially due to Rybelsus. A1c improved from 8% to 7.1%. Discussed hypoglycemia risks with glipizide and potential insulin need. Prefers Rybelsus despite gastrointestinal side effects. Discussed alternatives like Ozempic or Mounjaro if side effects worsen. - Continue Jardiance 25 mg daily - Continue Actos 30 mg daily - Continue Rybelsus - Check A1c, cholesterol, kidney, and liver function - Consider Ozempic or Mounjaro if gastrointestinal side effects worsen

## 2023-08-02 NOTE — Assessment & Plan Note (Signed)
Managed with simvastatin. No new concerns. - Continue simvastatin 20 mg daily

## 2023-08-02 NOTE — Assessment & Plan Note (Signed)
Managed with losartan and metoprolol. Blood pressure well-controlled. - Continue losartan 100 mg daily - Continue metoprolol XL 25 mg daily

## 2023-08-02 NOTE — Patient Instructions (Signed)
Consider asking at the pharmacy about the tetanus shot (TDAP) and the shingles shot (Shingrix)  ?

## 2023-08-02 NOTE — Progress Notes (Signed)
Annual Wellness Visit     Patient: Ivan Kennedy, Male    DOB: 1945-12-21, 77 y.o.   MRN: [id number] Visit Date: 08/02/2023  Today's Provider: Shirlee Latch, MD   Chief Complaint  Patient presents with   Annual Exam    Last AWV - 12/27/21 Diet - General, well balanced Exercise - daily for 20 minutes miniumm Feeling - well Sleeping - Well Concerns - none   Immunizations    Influenza- Receiving today Shingles- Aware to visit pharamcy Tetanus- Aware to visit pharamcy   Subjective    Ivan Kennedy is a 77 y.o. male who presents today for his Annual Wellness Visit.  Discussed the use of AI scribe software for clinical note transcription with the patient, who gave verbal consent to proceed.  History of Present Illness   A 77 year old patient with a history of hypertension, type two diabetes, hyperlipidemia, sleep apnea, and benign prostatic hyperplasia (BPH) presents for his annual wellness visit and physical. The patient reports that his current medication, Rybelsus, is causing frequent loose stools. He inquires about the possibility of switching to glipizide, a medication his father used, but understands the potential risks associated with it. The patient also mentions that his current medication for BPH, Flomax, is not effectively managing his symptoms as he still experiences frequent urination at night. He expresses interest in trying a higher dose of Flomax. Additionally, the patient brings up a growing lump on his chest, which has been previously evaluated and deemed benign. The patient is concerned about the growth of the lump but reports no associated pain or discomfort.             Medications: Outpatient Medications Prior to Visit  Medication Sig   Accu-Chek Softclix Lancets lancets See admin instructions.   ARTIFICIAL TEARS 1 % ophthalmic solution    Azelaic Acid 15 % gel Apply topically every morning.   Blood Glucose Monitoring Suppl (ONE TOUCH ULTRA 2)  w/Device KIT To check blood sugar once daily   doxycycline (VIBRAMYCIN) 50 MG capsule Take 50 mg by mouth 2 (two) times daily.   glucose blood (ONETOUCH ULTRA) test strip CHECK BLOOD SUGAR ONCE DAILY   Lancets (ACCU-CHEK SOFT TOUCH) lancets To check blood glucose daily   loratadine (CLARITIN) 10 MG tablet Take 10 mg by mouth daily.   losartan (COZAAR) 100 MG tablet Take 1 tablet (100 mg total) by mouth daily.   metoprolol succinate (TOPROL-XL) 25 MG 24 hr tablet Take 1 tablet (25 mg total) by mouth daily.   metroNIDAZOLE (METROGEL) 1 % gel Apply 1 application topically every morning.   neomycin-polymyxin b-dexamethasone (MAXITROL) 3.5-10000-0.1 OINT Place 1 Application into both eyes at bedtime.   omeprazole (PRILOSEC) 40 MG capsule TAKE 1 CAPSULE BY MOUTH EVERY DAY   ONETOUCH ULTRA test strip CHECK BLOOD SUGAR ONCE DAILY   POLYTRIM ophthalmic solution 1 drop 4 (four) times daily.   tretinoin (RETIN-A) 0.05 % cream Apply 1 application  topically at bedtime.   vitamin B-12 (CYANOCOBALAMIN) 1000 MCG tablet Take 1,000 mcg by mouth daily.   XDEMVY 0.25 % SOLN Apply 1 drop to eye 4 (four) times daily.   [DISCONTINUED] JARDIANCE 25 MG TABS tablet TAKE 1 TABLET BY MOUTH DAILY BEFORE BREAKFAST.   [DISCONTINUED] pioglitazone (ACTOS) 30 MG tablet TAKE 1 TABLET BY MOUTH EVERY DAY   [DISCONTINUED] Semaglutide (RYBELSUS) 14 MG TABS TAKE 14 MG (1 TABLET) BY MOUTH DAILY BEFORE BREAKFAST. TAKE ON AN EMPTY STOMACH WITH A SIP OF  WATER   [DISCONTINUED] simvastatin (ZOCOR) 20 MG tablet Take 1 tablet (20 mg total) by mouth daily at 6 PM.   [DISCONTINUED] sitaGLIPtin (JANUVIA) 100 MG tablet Take 100 mg by mouth daily.   [DISCONTINUED] tamsulosin (FLOMAX) 0.4 MG CAPS capsule TAKE 1 CAPSULE BY MOUTH EVERY DAY   No facility-administered medications prior to visit.    No Known Allergies  Patient Care Team: Erasmo Downer, MD as PCP - General (Family Medicine) Eli Phillips, OD as Consulting Physician  (Optometry) Jesusita Oka, MD as Consulting Physician (Dermatology) Wyline Mood, MD as Consulting Physician (Gastroenterology) Gaspar Cola, [facility] (Inactive) (Pharmacist)  Review of Systems       Objective    Vitals: BP 135/84 (BP Location: Right Arm, Patient Position: Sitting, Cuff Size: Normal)   Pulse 64   Ht 6\' 1"  (1.854 m)   Wt 226 lb 1.6 oz (102.6 kg)   SpO2 100%   BMI 29.83 kg/m      Physical Exam Vitals reviewed.  Constitutional:      General: He is not in acute distress.    Appearance: Normal appearance. He is well-developed. He is not diaphoretic.  HENT:     Head: Normocephalic and atraumatic.     Right Ear: Tympanic membrane, ear canal and external ear normal.     Left Ear: Tympanic membrane, ear canal and external ear normal.     Nose: Nose normal.     Mouth/Throat:     Mouth: Mucous membranes are moist.     Pharynx: Oropharynx is clear. No oropharyngeal exudate.  Eyes:     General: No scleral icterus.    Conjunctiva/sclera: Conjunctivae normal.     Pupils: Pupils are equal, round, and reactive to light.  Neck:     Thyroid: No thyromegaly.  Cardiovascular:     Rate and Rhythm: Normal rate and regular rhythm.     Heart sounds: Murmur heard.  Pulmonary:     Effort: Pulmonary effort is normal. No respiratory distress.     Breath sounds: Normal breath sounds. No wheezing or rales.  Abdominal:     General: There is no distension.     Palpations: Abdomen is soft.     Tenderness: There is no abdominal tenderness.  Musculoskeletal:        General: No deformity.     Cervical back: Neck supple.     Right lower leg: No edema.     Left lower leg: No edema.  Lymphadenopathy:     Cervical: No cervical adenopathy.  Skin:    General: Skin is warm and dry.     Findings: No rash.  Neurological:     Mental Status: He is alert and oriented to person, place, and time. Mental status is at baseline.     Gait: Gait normal.  Psychiatric:        Mood and  Affect: Mood normal.        Behavior: Behavior normal.        Thought Content: Thought content normal.     Most recent functional status assessment:    08/02/2023    8:28 AM  In your present state of health, do you have any difficulty performing the following activities:  Hearing? 0  Vision? 1  Difficulty concentrating or making decisions? 0  Walking or climbing stairs? 0  Dressing or bathing? 0  Doing errands, shopping? 0  Preparing Food and eating ? N  Using the Toilet? N  In the past six months, have  you accidently leaked urine? N  Do you have problems with loss of bowel control? N  Managing your Medications? N  Managing your Finances? N  Housekeeping or managing your Housekeeping? N   Most recent fall risk assessment:    08/02/2023    8:30 AM  Fall Risk   Falls in the past year? 0  Number falls in past yr: 0  Injury with Fall? 0  Risk for fall due to : No Fall Risks  Follow up Falls evaluation completed    Most recent depression screenings:    08/02/2023    8:31 AM 02/22/2023    9:36 AM  PHQ 2/9 Scores  PHQ - 2 Score 0 0   Most recent cognitive screening:    08/02/2023    8:31 AM  6CIT Screen  What Year? 0 points  What month? 0 points  What time? 0 points  Count back from 20 0 points  Months in reverse 0 points  Repeat phrase 0 points  Total Score 0 points   Most recent Audit-C alcohol use screening    12/27/2021    8:15 AM  Alcohol Use Disorder Test (AUDIT)  1. How often do you have a drink containing alcohol? 0  2. How many drinks containing alcohol do you have on a typical day when you are drinking? 0  3. How often do you have six or more drinks on one occasion? 0  AUDIT-C Score 0   A score of 3 or more in women, and 4 or more in men indicates increased risk for alcohol abuse, EXCEPT if all of the points are from question 1   No results found.  No results found for any visits on 08/02/23.  Assessment & Plan     Annual wellness visit done  today including the all of the following: Reviewed patient's Family Medical History Reviewed and updated list of patient's medical providers Assessment of cognitive impairment was done Assessed patient's functional ability Established a written schedule for health screening services Health Risk Assessent Completed and Reviewed  Exercise Activities and Dietary recommendations  Goals      DIET - EAT MORE FRUITS AND VEGETABLES     DIET - INCREASE WATER INTAKE     Recommend increasing water intake to 4-6 glasses a day.     DIET - REDUCE CARBOHYDRATES     Recommend to cut back on carbohydrates to 1 serving a day and to focus on eating more good carbohydrates.      Monitor and Manage My Blood Sugar-Diabetes Type 2     Timeframe:  Long-Range Goal Priority:  High Start Date:  01/04/2021                            Expected End Date: 07/07/2022                      Follow Up within 90 days   - check blood sugar at prescribed times - check blood sugar if I feel it is too high or too low - enter blood sugar readings and medication or insulin into daily log    Why is this important?   Checking your blood sugar at home helps to keep it from getting very high or very low.  Writing the results in a diary or log helps the doctor know how to care for you.  Your blood sugar log should have the time, date  and the results.  Also, write down the amount of insulin or other medicine that you take.  Other information, like what you ate, exercise done and how you were feeling, will also be helpful.     Notes:         Immunization History  Administered Date(s) Administered   Fluad Quad(high Dose 65+) 05/08/2019, 06/23/2020, 06/28/2021, 07/27/2022   Fluad Trivalent(High Dose 65+) 08/02/2023   Hepatitis B, ADULT 05/29/2012, 07/29/2012, 03/18/2013   Influenza, High Dose Seasonal PF 07/06/2014, 05/06/2015, 07/11/2016, 04/26/2017, 04/24/2018   PFIZER(Purple Top)SARS-COV-2 Vaccination 09/29/2019,  10/28/2019   Pneumococcal Conjugate-13 07/06/2014   Pneumococcal Polysaccharide-23 05/29/2012, 10/30/2018    Health Maintenance  Topic Date Due   DTaP/Tdap/Td (1 - Tdap) Never done   Zoster Vaccines- Shingrix (1 of 2) Never done   COVID-19 Vaccine (3 - 2024-25 season) 04/15/2023   Diabetic kidney evaluation - Urine ACR  07/28/2023   HEMOGLOBIN A1C  08/25/2023   OPHTHALMOLOGY EXAM  09/09/2023   Diabetic kidney evaluation - eGFR measurement  02/22/2024   FOOT EXAM  02/22/2024   Medicare Annual Wellness (AWV)  08/01/2024   Colonoscopy  04/23/2033   Pneumonia Vaccine 32+ Years old  Completed   INFLUENZA VACCINE  Completed   Hepatitis C Screening  Completed   HPV VACCINES  Aged Out     Discussed health benefits of physical activity, and encouraged him to engage in regular exercise appropriate for his age and condition.    Problem List Items Addressed This Visit       Cardiovascular and Mediastinum   Hypertension associated with diabetes (HCC)   Managed with losartan and metoprolol. Blood pressure well-controlled. - Continue losartan 100 mg daily - Continue metoprolol XL 25 mg daily      Relevant Medications   empagliflozin (JARDIANCE) 25 MG TABS tablet   pioglitazone (ACTOS) 30 MG tablet   Semaglutide (RYBELSUS) 14 MG TABS   simvastatin (ZOCOR) 20 MG tablet   Other Relevant Orders   Comprehensive metabolic panel   Hemoglobin A1c     Endocrine   Type 2 diabetes mellitus without complications (HCC)   Managed with Jardiance, Actos, and Rybelsus. Reports loose stools potentially due to Rybelsus. A1c improved from 8% to 7.1%. Discussed hypoglycemia risks with glipizide and potential insulin need. Prefers Rybelsus despite gastrointestinal side effects. Discussed alternatives like Ozempic or Mounjaro if side effects worsen. - Continue Jardiance 25 mg daily - Continue Actos 30 mg daily - Continue Rybelsus - Check A1c, cholesterol, kidney, and liver function - Consider Ozempic  or Mounjaro if gastrointestinal side effects worsen      Relevant Medications   empagliflozin (JARDIANCE) 25 MG TABS tablet   pioglitazone (ACTOS) 30 MG tablet   Semaglutide (RYBELSUS) 14 MG TABS   simvastatin (ZOCOR) 20 MG tablet   Other Relevant Orders   Urine microalbumin-creatinine with uACR   Comprehensive metabolic panel   Hemoglobin A1c   Hyperlipidemia associated with type 2 diabetes mellitus (HCC)   Managed with simvastatin. No new concerns. - Continue simvastatin 20 mg daily      Relevant Medications   empagliflozin (JARDIANCE) 25 MG TABS tablet   pioglitazone (ACTOS) 30 MG tablet   Semaglutide (RYBELSUS) 14 MG TABS   simvastatin (ZOCOR) 20 MG tablet   Other Relevant Orders   Lipid Panel With LDL/HDL Ratio     Other   Benign prostatic hyperplasia with nocturia   Reports nocturia three times per night despite current Flomax dose. Discussed increasing dose to improve symptoms.  Explained Flomax's role in decreasing prostate size. - Increase Flomax to 0.8 mg daily      RESOLVED: Pure hypercholesterolemia   Relevant Medications   simvastatin (ZOCOR) 20 MG tablet   Other Visit Diagnoses       Encounter for annual wellness visit (AWV) in Medicare patient    -  Primary     Encounter for annual physical exam       Relevant Orders   Urine microalbumin-creatinine with uACR   Comprehensive metabolic panel   Lipid Panel With LDL/HDL Ratio   Hemoglobin A1c     Influenza vaccine needed       Relevant Orders   Flu Vaccine Trivalent High Dose (Fluad) (Completed)     Type 2 diabetes mellitus with hyperglycemia, without long-term current use of insulin (HCC)       Relevant Medications   empagliflozin (JARDIANCE) 25 MG TABS tablet   pioglitazone (ACTOS) 30 MG tablet   Semaglutide (RYBELSUS) 14 MG TABS   simvastatin (ZOCOR) 20 MG tablet           General Health Maintenance Discussed importance of vaccinations and routine screenings. Received flu shot today. Due for  shingles and tetanus vaccines. Eye exam follow-up needed due to ongoing vision issues. - Recommend shingles and tetanus vaccines - Follow up with eye doctor and consider referral to a specialist in [place] or [facility] - Annual urine test for protein levels  Follow-up - Follow up in six months - Schedule lab work for A1c, cholesterol, kidney, and liver function - Send prescriptions for Jardiance, Actos, simvastatin, and Rybelsus [facility] to [facility].          Return in about 6 months (around 01/31/2024) for chronic disease f/u.     Shirlee Latch, MD  [facility] [phone] (phone) [phone] (fax)  [facility]

## 2023-08-03 LAB — COMPREHENSIVE METABOLIC PANEL
ALT: 17 [IU]/L (ref 0–44)
AST: 22 [IU]/L (ref 0–40)
Albumin: 4.2 g/dL (ref 3.8–4.8)
Alkaline Phosphatase: 63 [IU]/L (ref 44–121)
BUN/Creatinine Ratio: 11 (ref 10–24)
BUN: 11 mg/dL (ref 8–27)
Bilirubin Total: 1.1 mg/dL (ref 0.0–1.2)
CO2: 21 mmol/L (ref 20–29)
Calcium: 9.2 mg/dL (ref 8.6–10.2)
Chloride: 108 mmol/L — ABNORMAL HIGH (ref 96–106)
Creatinine, Ser: 0.96 mg/dL (ref 0.76–1.27)
Globulin, Total: 1.8 g/dL (ref 1.5–4.5)
Glucose: 132 mg/dL — ABNORMAL HIGH (ref 70–99)
Potassium: 3.7 mmol/L (ref 3.5–5.2)
Sodium: 145 mmol/L — ABNORMAL HIGH (ref 134–144)
Total Protein: 6 g/dL (ref 6.0–8.5)
eGFR: 82 mL/min/{1.73_m2} (ref >59)

## 2023-08-03 LAB — LIPID PANEL WITH LDL/HDL RATIO
Cholesterol, Total: 112 mg/dL (ref 100–199)
HDL: 49 mg/dL (ref >39)
LDL Chol Calc (NIH): 48 mg/dL (ref 0–99)
LDL/HDL Ratio: 1 {ratio} (ref 0.0–3.6)
Triglycerides: 69 mg/dL (ref 0–149)
VLDL Cholesterol Cal: 15 mg/dL (ref 5–40)

## 2023-08-03 LAB — MICROALBUMIN / CREATININE URINE RATIO
Creatinine, Urine: 83.5 mg/dL
Microalb/Creat Ratio: 14 mg/g{creat} (ref 0–29)
Microalbumin, Urine: 11.6 ug/mL

## 2023-08-03 LAB — HEMOGLOBIN A1C
Est. average glucose Bld gHb Est-mCnc: 169 mg/dL
Hgb A1c MFr Bld: 7.5 % — ABNORMAL HIGH (ref 4.8–5.6)

## 2023-08-06 ENCOUNTER — Telehealth: Payer: Self-pay | Admitting: Family Medicine

## 2023-08-06 ENCOUNTER — Ambulatory Visit: Payer: Self-pay | Admitting: *Deleted

## 2023-08-06 DIAGNOSIS — E119 Type 2 diabetes mellitus without complications: Secondary | ICD-10-CM

## 2023-08-06 MED ORDER — TIRZEPATIDE 2.5 MG/0.5ML ~~LOC~~ SOAJ
2.5000 mg | SUBCUTANEOUS | 0 refills | Status: DC
Start: 2023-08-06 — End: 2023-08-20

## 2023-08-06 NOTE — Addendum Note (Signed)
Addended by: Malva Limes on: 08/06/2023 02:53 PM   Modules accepted: Orders

## 2023-08-06 NOTE — Telephone Encounter (Signed)
Results given in a separate encounter.

## 2023-08-06 NOTE — Telephone Encounter (Signed)
Patient called and he says he will have to call back in about 30-40 minutes. Advised this is a call regarding lab results, he verbalized understanding.

## 2023-08-06 NOTE — Telephone Encounter (Signed)
Reason for Disposition  [1] Follow-up call to recent contact AND [2] information only call, no triage required  Answer Assessment - Initial Assessment Questions 1. REASON FOR CALL or QUESTION: "What is your reason for calling today?" or "How can I best help you?" or "What question do you have that I can help answer?"     He returned call and was given the message from Dr. Beryle Flock regarding his lab results.  He is ok with taking the Mounjaro instead of Rybelsus.    He is leaving town Wed. And will not be back until 08/16/2023.   If Dr. Beryle Flock can call it in and the pharmacy have it ready in time he can get it tomorrow 12/24.    If not he will stay on the Rybelsus for 30 more days until he gets back but would prefer to start the [facility] now if possible. Message sent to Dr. Beryle Flock.  Protocols used: Information Only Call - No Triage-A-AH

## 2023-08-06 NOTE — Telephone Encounter (Signed)
  Chief Complaint: He returned call and was given the message from Dr. Beryle Flock regarding his labs results.   He is willing to switch from Rybelsus to [facility].    He is leaving town Wed. And won't be back until 08/16/2023.   If Dr. Beryle Flock can send in the [facility] so the pharmacy will have it ready by tomorrow for pick up he will go ahead and switch.   He would prefer to do this otherwise he will stay on the Rybelsus for 30 more days and then start the [facility] when he finishes the 30 days of Rybelsus.   Symptoms: N/A Frequency: N/A Pertinent Negatives: Patient denies N/A Disposition: [] ED /[] Urgent Care (no appt availability in office) / [] Appointment(In office/virtual)/ []  [place] [facility]/ [] Home Care/ [] Refused Recommended Disposition /[] [place] [facility]/ [x]  Follow-up with PCP Additional Notes: High priority message sent to Dr. Beryle Flock to call in rx today for the [facility].

## 2023-08-06 NOTE — Telephone Encounter (Signed)
Received fax from [employer] for [facility] 2.5mg /0.40ml  Key:  [id number]

## 2023-08-06 NOTE — Telephone Encounter (Signed)
 Pt is requesting a call back to discuss labs. Please advise.

## 2023-08-07 ENCOUNTER — Ambulatory Visit: Payer: Self-pay | Admitting: *Deleted

## 2023-08-07 NOTE — Telephone Encounter (Signed)
Reason for Disposition . [1] Blood glucose 240 - 300 mg/dL (13.0 - 86.5 mmol/L) AND [2] does not  use insulin (e.g., not insulin-dependent; most people with type 2 diabetes) . Blood glucose 70-240 mg/dL (3.9 -78.4 mmol/L)  Answer Assessment - Initial Assessment Questions 1. BLOOD GLUCOSE: "What is your blood glucose level?"      238- fasting, 175-fasting yesterday 2. ONSET: "When did you check the blood glucose?"     Patient reports his glucose is elevated for last couple 3. USUAL RANGE: "What is your glucose level usually?" (e.g., usual fasting morning value, usual evening value)     130-160 4. KETONES: "Do you check for ketones (urine or blood test strips)?" If Yes, ask: "What does the test show now?"      na 5. TYPE 1 or 2:  "Do you know what type of diabetes you have?"  (e.g., Type 1, Type 2, Gestational; doesn't know)      Type 2 6. INSULIN: "Do you take insulin?" "What type of insulin(s) do you use? What is the mode of delivery? (syringe, pen; injection or pump)?"      no 7. DIABETES PILLS: "Do you take any pills for your diabetes?" If Yes, ask: "Have you missed taking any pills recently?"     empagliflozin (JARDIANCE) 25 MG TABS tablet    pioglitazone (ACTOS) 30 MG tablet   Semaglutide (RYBELSUS) 14 MG TABS   simvastatin (ZOCOR) 20 MG tablet   8. OTHER SYMPTOMS: "Do you have any symptoms?" (e.g., fever, frequent urination, difficulty breathing, dizziness, weakness, vomiting)     No- slight lightheadedness  Protocols used: Diabetes - High Blood Sugar-A-AH

## 2023-08-07 NOTE — Telephone Encounter (Signed)
  Chief Complaint: elevated glucose- seems to be trending up Symptoms: fasting glucose readings- trending up- awaiting PA on new Rx Frequency: fasting last few days- A1c was elevated in office Pertinent Negatives: Patient denies symptoms although does states he has slight dizziness Disposition: [] ED /[] Urgent Care (no appt availability in office) / [x] Appointment(In office/virtual)/ []  [place] [facility]/ [] Home Care/ [] Refused Recommended Disposition /[] [place] [facility]/ []  Follow-up with PCP Additional Notes: Patient is awaiting PA for another medication- he is concerned because he is leaving to go out of town Thursday- virtual visit appointment scheduled.

## 2023-08-08 ENCOUNTER — Observation Stay: Payer: PPO

## 2023-08-08 ENCOUNTER — Other Ambulatory Visit: Payer: Self-pay

## 2023-08-08 ENCOUNTER — Emergency Department: Payer: PPO

## 2023-08-08 ENCOUNTER — Observation Stay
Admission: EM | Admit: 2023-08-08 | Discharge: 2023-08-09 | Disposition: A | Discharge status: Home / Self Care | Payer: PPO | Attending: Family Medicine | Admitting: Family Medicine

## 2023-08-08 DIAGNOSIS — K219 Gastro-esophageal reflux disease without esophagitis: Secondary | ICD-10-CM | POA: Diagnosis not present

## 2023-08-08 DIAGNOSIS — E785 Hyperlipidemia, unspecified: Secondary | ICD-10-CM | POA: Insufficient documentation

## 2023-08-08 DIAGNOSIS — I10 Essential (primary) hypertension: Secondary | ICD-10-CM | POA: Diagnosis not present

## 2023-08-08 DIAGNOSIS — R519 Headache, unspecified: Secondary | ICD-10-CM | POA: Diagnosis not present

## 2023-08-08 DIAGNOSIS — Z96651 Presence of right artificial knee joint: Secondary | ICD-10-CM | POA: Insufficient documentation

## 2023-08-08 DIAGNOSIS — I639 Cerebral infarction, unspecified: Principal | ICD-10-CM | POA: Diagnosis present

## 2023-08-08 DIAGNOSIS — R270 Ataxia, unspecified: Secondary | ICD-10-CM

## 2023-08-08 DIAGNOSIS — Z79899 Other long term (current) drug therapy: Secondary | ICD-10-CM | POA: Diagnosis not present

## 2023-08-08 DIAGNOSIS — I6523 Occlusion and stenosis of bilateral carotid arteries: Secondary | ICD-10-CM | POA: Diagnosis not present

## 2023-08-08 DIAGNOSIS — I672 Cerebral atherosclerosis: Secondary | ICD-10-CM | POA: Diagnosis not present

## 2023-08-08 DIAGNOSIS — R42 Dizziness and giddiness: Secondary | ICD-10-CM | POA: Diagnosis not present

## 2023-08-08 DIAGNOSIS — Z7985 Long-term (current) use of injectable non-insulin antidiabetic drugs: Secondary | ICD-10-CM | POA: Diagnosis not present

## 2023-08-08 DIAGNOSIS — I152 Hypertension secondary to endocrine disorders: Secondary | ICD-10-CM | POA: Diagnosis present

## 2023-08-08 DIAGNOSIS — E119 Type 2 diabetes mellitus without complications: Secondary | ICD-10-CM

## 2023-08-08 DIAGNOSIS — R29818 Other symptoms and signs involving the nervous system: Secondary | ICD-10-CM | POA: Diagnosis not present

## 2023-08-08 DIAGNOSIS — Z7984 Long term (current) use of oral hypoglycemic drugs: Secondary | ICD-10-CM | POA: Insufficient documentation

## 2023-08-08 DIAGNOSIS — E1159 Type 2 diabetes mellitus with other circulatory complications: Secondary | ICD-10-CM | POA: Diagnosis present

## 2023-08-08 DIAGNOSIS — G9389 Other specified disorders of brain: Secondary | ICD-10-CM | POA: Diagnosis not present

## 2023-08-08 DIAGNOSIS — R269 Unspecified abnormalities of gait and mobility: Secondary | ICD-10-CM | POA: Diagnosis not present

## 2023-08-08 DIAGNOSIS — E1169 Type 2 diabetes mellitus with other specified complication: Secondary | ICD-10-CM | POA: Diagnosis not present

## 2023-08-08 DIAGNOSIS — I63541 Cerebral infarction due to unspecified occlusion or stenosis of right cerebellar artery: Principal | ICD-10-CM | POA: Insufficient documentation

## 2023-08-08 LAB — CBC
HCT: 44 % (ref 39.0–52.0)
HCT: 39.5 % (ref 39.0–52.0)
Hemoglobin: 14.8 g/dL (ref 13.0–17.0)
Hemoglobin: 13.8 g/dL (ref 13.0–17.0)
MCH: 30.1 pg (ref 26.0–34.0)
MCH: 31 pg (ref 26.0–34.0)
MCHC: 33.6 g/dL (ref 30.0–36.0)
MCHC: 34.9 g/dL (ref 30.0–36.0)
MCV: 89.6 fL (ref 80.0–100.0)
MCV: 88.8 fL (ref 80.0–100.0)
Platelets: 107 10*3/uL — ABNORMAL LOW (ref 150–400)
Platelets: 109 10*3/uL — ABNORMAL LOW (ref 150–400)
RBC: 4.91 MIL/uL (ref 4.22–5.81)
RBC: 4.45 MIL/uL (ref 4.22–5.81)
RDW: 13.4 % (ref 11.5–15.5)
RDW: 13.3 % (ref 11.5–15.5)
WBC: 5.4 10*3/uL (ref 4.0–10.5)
WBC: 5.4 10*3/uL (ref 4.0–10.5)
nRBC: 0 % (ref 0.0–0.2)
nRBC: 0 % (ref 0.0–0.2)

## 2023-08-08 LAB — PROTIME-INR
INR: 1.1 (ref 0.8–1.2)
Prothrombin Time: 14.7 s (ref 11.4–15.2)

## 2023-08-08 LAB — COMPREHENSIVE METABOLIC PANEL
ALT: 20 U/L (ref 0–44)
AST: 22 U/L (ref 15–41)
Albumin: 4.1 g/dL (ref 3.5–5.0)
Alkaline Phosphatase: 48 U/L (ref 38–126)
Anion gap: 12 (ref 5–15)
BUN: 18 mg/dL (ref 8–23)
CO2: 23 mmol/L (ref 22–32)
Calcium: 9.1 mg/dL (ref 8.9–10.3)
Chloride: 104 mmol/L (ref 98–111)
Creatinine, Ser: 1.1 mg/dL (ref 0.61–1.24)
GFR, Estimated: >60 mL/min (ref >60)
Glucose, Bld: 304 mg/dL — ABNORMAL HIGH (ref 70–99)
Potassium: 4.2 mmol/L (ref 3.5–5.1)
Sodium: 139 mmol/L (ref 135–145)
Total Bilirubin: 1.4 mg/dL — ABNORMAL HIGH (ref <1.2)
Total Protein: 6.4 g/dL — ABNORMAL LOW (ref 6.5–8.1)

## 2023-08-08 LAB — APTT: aPTT: 29 s (ref 24–36)

## 2023-08-08 LAB — DIFFERENTIAL
Abs Immature Granulocytes: 0.02 10*3/uL (ref 0.00–0.07)
Basophils Absolute: 0 10*3/uL (ref 0.0–0.1)
Basophils Relative: 0 %
Eosinophils Absolute: 0.1 10*3/uL (ref 0.0–0.5)
Eosinophils Relative: 2 %
Immature Granulocytes: 0 %
Lymphocytes Relative: 19 %
Lymphs Abs: 1.1 10*3/uL (ref 0.7–4.0)
Monocytes Absolute: 0.4 10*3/uL (ref 0.1–1.0)
Monocytes Relative: 6 %
Neutro Abs: 3.9 10*3/uL (ref 1.7–7.7)
Neutrophils Relative %: 73 %

## 2023-08-08 LAB — CBG MONITORING, ED
Glucose-Capillary: 200 mg/dL — ABNORMAL HIGH (ref 70–99)
Glucose-Capillary: 101 mg/dL — ABNORMAL HIGH (ref 70–99)

## 2023-08-08 LAB — ETHANOL: Alcohol, Ethyl (B): <10 mg/dL (ref <10)

## 2023-08-08 MED ORDER — SENNOSIDES-DOCUSATE SODIUM 8.6-50 MG PO TABS: 1.0000 | ORAL_TABLET | Freq: Every evening | ORAL | Status: DC | PRN

## 2023-08-08 MED ORDER — ATORVASTATIN CALCIUM 20 MG PO TABS
80.0000 mg | ORAL_TABLET | Freq: Every day | ORAL | Status: DC
  Administered 2023-08-08 – 2023-08-09 (×2): 80 mg via ORAL
  Filled 2023-08-08 (×2): qty 4

## 2023-08-08 MED ORDER — STROKE: EARLY STAGES OF RECOVERY BOOK: Freq: Once | Status: AC

## 2023-08-08 MED ORDER — LABETALOL HCL 5 MG/ML IV SOLN: 10.0000 mg | INTRAVENOUS | Status: DC | PRN

## 2023-08-08 MED ORDER — ASPIRIN 81 MG PO TBEC
81.0000 mg | DELAYED_RELEASE_TABLET | Freq: Every day | ORAL | Status: DC
  Administered 2023-08-08 – 2023-08-09 (×2): 81 mg via ORAL
  Filled 2023-08-08 (×2): qty 1

## 2023-08-08 MED ORDER — ACETAMINOPHEN 650 MG RE SUPP: 650.0000 mg | RECTAL | Status: DC | PRN

## 2023-08-08 MED ORDER — CLOPIDOGREL BISULFATE 75 MG PO TABS
300.0000 mg | ORAL_TABLET | Freq: Once | ORAL | Status: AC
  Administered 2023-08-08: 300 mg via ORAL
  Filled 2023-08-08: qty 4

## 2023-08-08 MED ORDER — SODIUM CHLORIDE 0.9 % IV SOLN: INTRAVENOUS | Status: AC

## 2023-08-08 MED ORDER — ACETAMINOPHEN 325 MG PO TABS
650.0000 mg | ORAL_TABLET | ORAL | Status: DC | PRN
  Administered 2023-08-08: 650 mg via ORAL
  Filled 2023-08-08: qty 2

## 2023-08-08 MED ORDER — CLOPIDOGREL BISULFATE 75 MG PO TABS
75.0000 mg | ORAL_TABLET | Freq: Every day | ORAL | Status: DC
Start: 2023-08-09 — End: 2023-08-09
  Administered 2023-08-09: 75 mg via ORAL
  Filled 2023-08-08: qty 1

## 2023-08-08 MED ORDER — IOHEXOL 350 MG/ML SOLN
75.0000 mL | Freq: Once | INTRAVENOUS | Status: AC | PRN
  Administered 2023-08-08: 75 mL via INTRAVENOUS

## 2023-08-08 MED ORDER — ENOXAPARIN SODIUM 40 MG/0.4ML IJ SOSY
40.0000 mg | PREFILLED_SYRINGE | INTRAMUSCULAR | Status: DC
  Administered 2023-08-08: 40 mg via SUBCUTANEOUS
  Filled 2023-08-08: qty 0.4

## 2023-08-08 MED ORDER — ACETAMINOPHEN 160 MG/5ML PO SOLN: 650.0000 mg | ORAL | Status: DC | PRN

## 2023-08-08 MED ORDER — ROSUVASTATIN CALCIUM 20 MG PO TABS: 40.0000 mg | ORAL_TABLET | Freq: Every day | ORAL | Status: DC

## 2023-08-08 MED ORDER — INSULIN ASPART 100 UNIT/ML IJ SOLN
0.0000 [IU] | Freq: Three times a day (TID) | INTRAMUSCULAR | Status: DC
  Administered 2023-08-08: 3 [IU] via SUBCUTANEOUS
  Filled 2023-08-08: qty 1

## 2023-08-08 MED ORDER — INSULIN ASPART 100 UNIT/ML IJ SOLN
3.0000 [IU] | Freq: Three times a day (TID) | INTRAMUSCULAR | Status: DC
Start: 2023-08-08 — End: 2023-08-09
  Administered 2023-08-08 – 2023-08-09 (×3): 3 [IU] via SUBCUTANEOUS
  Filled 2023-08-08 (×3): qty 1

## 2023-08-08 MED ORDER — INSULIN ASPART 100 UNIT/ML IJ SOLN: 0.0000 [IU] | Freq: Three times a day (TID) | INTRAMUSCULAR | Status: DC

## 2023-08-08 NOTE — H&P (Addendum)
History and Physical    Patient: Ivan Kennedy EPP:[id number] DOB: 19-Aug-1945 DOA: 08/08/2023 DOS: the patient was seen and examined on 08/08/2023 PCP: Erasmo Downer, MD  Patient coming from: Home  Chief Complaint:  Chief Complaint  Patient presents with   Transient Ischemic Attack   multiple complaints   HPI: Ivan Kennedy is a 77 y.o. male with medical history significant of type 2 diabetes, GERD, HTN, HLD presenting w/ CVA.  Patient reports difficulty with ambulation since around Monday around 11:00.  No focal hemiparesis or confusion.  No slurred speech.  Has had some mild dizziness.  Ataxia is intermittent.  Can sometimes walk albeit with difficulty.  No chest pain or shortness of breath.  No nausea or vomiting.  No headache or vision changes.  Symptoms have been fairly persistent.  Denies any known prior history of stroke in the past.  Previously on baby aspirin.  Patient reports that he was taken off roughly 1 year ago by PCP.  No reported history of GI bleeding or stomach ulcers in the past.  Baseline type 2 diabetes.  Blood sugars been fairly variable into the 200s. WBC 5.4, hgb 14.8, plt 107. Cr 1.1, Glu 304. 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 EKG NSR w/ LBBB.   Review of Systems: As mentioned in the history of present illness. All other systems reviewed and are negative. Past Medical History:  Diagnosis Date   Colon polyp 2005   Diabetes mellitus (HCC)    GERD (gastroesophageal reflux disease)    Hyperlipidemia    Hypertension    Rosacea    Sleep apnea    CPAP   Past Surgical History:  Procedure Laterality Date   ANAL FISSURE REPAIR  1998   BROW LIFT Bilateral 09/02/2021   Procedure: BLEPHAROPLASTY UPPER EYELID; W/EXCESS SKIN ECTROPION REPAIR, EXTENSIVE WEDGE EXCISION, UPPER BILATERAL;  Surgeon: Imagene Riches, MD;  Location: [facility];  Service: Ophthalmology;  Laterality: Bilateral;  Diabetic  sleep apnea   BROW LIFT  Right 06/02/2022   Procedure: BLEPHAROPTOSIS REPAIR; RESECT EX RIGHT;  Surgeon: Imagene Riches, MD;  Location: [facility];  Service: Ophthalmology;  Laterality: Right;  Diabetic   CATARACT EXTRACTION W/PHACO Right 05/25/2021   Procedure: CATARACT EXTRACTION PHACO AND INTRAOCULAR LENS PLACEMENT (IOC) RIGHT DIABETIC 6.64 00:54.7;  Surgeon: Lockie Mola, MD;  Location: [facility];  Service: Ophthalmology;  Laterality: Right;  Diabetic, sleep apnea   CATARACT EXTRACTION W/PHACO Left 06/08/2021   Procedure: CATARACT EXTRACTION PHACO AND INTRAOCULAR LENS PLACEMENT (IOC) LEFT DIABETIC;  Surgeon: Lockie Mola, MD;  Location: [facility];  Service: Ophthalmology;  Laterality: Left;  Diabetic, sleep apnea 7.57 01:02.2   COLONOSCOPY  2005, 2010   COLONOSCOPY WITH PROPOFOL N/A 01/19/2020   Procedure: COLONOSCOPY WITH PROPOFOL;  Surgeon: Wyline Mood, MD;  Location: [facility];  Service: Gastroenterology;  Laterality: N/A;   COLONOSCOPY WITH PROPOFOL N/A 04/24/2023   Procedure: COLONOSCOPY WITH PROPOFOL;  Surgeon: Wyline Mood, MD;  Location: [facility];  Service: Gastroenterology;  Laterality: N/A;   ESOPHAGOGASTRODUODENOSCOPY (EGD) WITH PROPOFOL N/A 04/24/2023   Procedure: ESOPHAGOGASTRODUODENOSCOPY (EGD) WITH PROPOFOL;  Surgeon: Wyline Mood, MD;  Location: [facility];  Service: Gastroenterology;  Laterality: N/A;   POLYPECTOMY  04/24/2023   Procedure: POLYPECTOMY;  Surgeon: Wyline Mood, MD;  Location: [facility] END[facility];  Service: Gastroenterology;;   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 Deeann Saint   shoulder spurs Right 2000   Social History:  reports that he has never smoked. He has never used smokeless tobacco. He reports current alcohol use. He reports that he does not use drugs.  No Known Allergies  Family History  Problem Relation Age of Onset   Diabetes Mother     Prior to Admission medications   Medication Sig Start Date End Date Taking?  Authorizing Provider  Accu-Chek Softclix Lancets lancets See admin instructions.    [provider]  ARTIFICIAL TEARS 1 % ophthalmic solution  09/05/21   [provider]  Azelaic Acid 15 % gel Apply topically every morning. 12/28/21   [provider]  Blood Glucose Monitoring Suppl (ONE TOUCH ULTRA 2) w/Device KIT To check blood sugar once daily 03/25/20   Margaretann Loveless, PA-C  doxycycline (VIBRAMYCIN) 50 MG capsule Take 50 mg by mouth 2 (two) times daily.    [provider]  empagliflozin (JARDIANCE) 25 MG TABS tablet Take 1 tablet (25 mg total) by mouth daily before breakfast. 08/02/23   Bacigalupo, Marzella Schlein, MD  glucose blood (ONETOUCH ULTRA) test strip CHECK BLOOD SUGAR ONCE DAILY 10/03/21   Erasmo Downer, MD  Lancets (ACCU-CHEK SOFT TOUCH) lancets To check blood glucose daily 08/22/17   Margaretann Loveless, PA-C  loratadine (CLARITIN) 10 MG tablet Take 10 mg by mouth daily.    [provider]  losartan (COZAAR) 100 MG tablet Take 1 tablet (100 mg total) by mouth daily. 02/22/23   Erasmo Downer, MD  metoprolol succinate (TOPROL-XL) 25 MG 24 hr tablet Take 1 tablet (25 mg total) by mouth daily. 02/22/23   Erasmo Downer, MD  metroNIDAZOLE (METROGEL) 1 % gel Apply 1 application topically every morning. 03/30/15   [provider]  neomycin-polymyxin b-dexamethasone (MAXITROL) 3.5-10000-0.1 OINT Place 1 Application into both eyes at bedtime. 10/12/22   [provider]  omeprazole (PRILOSEC) 40 MG capsule TAKE 1 CAPSULE BY MOUTH EVERY DAY 05/15/23   Bacigalupo, Marzella Schlein, MD  [facility] ULTRA test strip CHECK BLOOD SUGAR ONCE DAILY 10/05/21   Erasmo Downer, MD  pioglitazone (ACTOS) 30 MG tablet Take 1 tablet (30 mg total) by mouth daily. 08/02/23   Erasmo Downer, MD  POLYTRIM ophthalmic solution 1 drop 4 (four) times daily. 12/27/21   [provider]  simvastatin (ZOCOR) 20 MG tablet Take 1 tablet (20 mg  total) by mouth daily at 6 PM. 08/02/23   Bacigalupo, Marzella Schlein, MD  tamsulosin (FLOMAX) 0.4 MG CAPS capsule Take 2 capsules (0.8 mg total) by mouth daily. 08/02/23   Erasmo Downer, MD  tirzepatide [facility]) 2.5 MG/0.5ML Pen Inject 2.5 mg into the skin once a week. Take in place of Rybelsus 08/06/23   Malva Limes, MD  tretinoin (RETIN-A) 0.05 % cream Apply 1 application  topically at bedtime. 02/22/23   Erasmo Downer, MD  vitamin B-12 (CYANOCOBALAMIN) 1000 MCG tablet Take 1,000 mcg by mouth daily.    [provider]  XDEMVY 0.25 % SOLN Apply 1 drop to eye 4 (four) times daily. 12/05/22   [provider]    Physical Exam: Vitals:   08/08/23 1220 08/08/23 1221 08/08/23 1224  BP:   (!) 148/90  Pulse:   73  Resp:   16  Temp:   (!) 97.5 F (36.4 C)  TempSrc:   Oral  SpO2:   96%  Weight: 102 kg 84.4 kg   Height: 6\' 1"  (1.854 m)     Physical Exam Constitutional:  Appearance: He is normal weight.  HENT:     Head: Normocephalic and atraumatic.     Nose: Nose normal.     Mouth/Throat:     Mouth: Mucous membranes are moist.  Eyes:     Pupils: Pupils are equal, round, and reactive to light.  Cardiovascular:     Rate and Rhythm: Normal rate and regular rhythm.  Pulmonary:     Effort: Pulmonary effort is normal.  Abdominal:     General: Bowel sounds are normal.  Musculoskeletal:        General: Normal range of motion.  Skin:    General: Skin is warm.  Neurological:     General: No focal deficit present.  Psychiatric:        Mood and Affect: Mood normal.     Data Reviewed:  There are no new results to review at this time.  CT HEAD WO CONTRAST Addendum: ADDENDUM REPORT: 08/08/2023 13:03   ADDENDUM:  Study discussed by telephone with Dr. Claudell Kyle on 08/08/2023 at  1257 hours.   Electronically Signed    By: Odessa Fleming M.D.    On: 08/08/2023 13:03 Narrative: CLINICAL DATA:  77 year old male with headache, dizziness, loss  of balance.  EXAM: CT HEAD WITHOUT CONTRAST  TECHNIQUE: Contiguous axial images were obtained from the base of the skull through the vertex without intravenous contrast.  RADIATION DOSE REDUCTION: This exam was performed according to the departmental dose-optimization program which includes automated exposure control, adjustment of the mA and/or kV according to patient size and/or use of iterative reconstruction technique.  COMPARISON:  None Available.  FINDINGS: Brain: Cerebral volume is within normal limits for age.  Acute to subacute appearing right cerebellum PICA territory infarct, in an area of about 2.3 cm. No hemorrhagic transformation. No posterior fossa mass effect.  No superimposed ventriculomegaly, evidence of mass lesion, intracranial hemorrhage or supratentorial cortically based acute infarction. Mild for age periventricular white matter hypodensity.  Vascular: Calcified atherosclerosis at the skull base. Intracranial artery tortuosity. No suspicious intracranial vascular hyperdensity.  Skull: Intact.  No acute osseous abnormality identified.  Sinuses/Orbits: Tympanic cavities, Visualized paranasal sinuses and mastoids are clear.  Other: Visualized orbits and scalp soft tissues are within normal limits.  IMPRESSION: 1. 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 No hemorrhagic transformation or mass effect. 2. No other acute intracranial abnormality. Mild for age supratentorial white matter changes most commonly due to small vessel disease.  Electronically Signed: By: Odessa Fleming M.D. On: 08/08/2023 12:53  Lab Results  Component Value Date   WBC 5.4 08/08/2023   HGB 14.8 08/08/2023   HCT 44.0 08/08/2023   MCV 89.6 08/08/2023   PLT 107 (L) 08/08/2023   Last metabolic panel Lab Results  Component Value Date   GLUCOSE 304 (H) 08/08/2023   NA 139 08/08/2023   K 4.2 08/08/2023   CL 104 08/08/2023   CO2 23 08/08/2023   BUN  18 08/08/2023   CREATININE 1.10 08/08/2023   GFRNONAA >60 08/08/2023   CALCIUM 9.1 08/08/2023   PROT 6.4 (L) 08/08/2023   ALBUMIN 4.1 08/08/2023   LABGLOB 1.8 08/02/2023   AGRATIO 2.9 (H) 07/27/2022   BILITOT 1.4 (H) 08/08/2023   ALKPHOS 48 08/08/2023   AST 22 08/08/2023   ALT 20 08/08/2023   ANIONGAP 12 08/08/2023    Assessment and Plan: * CVA (cerebral vascular accident) (HCC) Positive ataxia and gait instability x 48 hours with noted Acute or subacute Right cerebellum PICA  territory infarct on CT head  Will plan for formal stroke evaluation including MRI of the brain, CTA of the head neck, 2D echo, risk stratification labs Neurology consulted Start statin Antiplatelet regimen per neurology recommendations Monitor  Hypertension associated with diabetes (HCC) Allow for permissive HTN in setting of CVA evaluation  Prn IV labetalol for SBP>220 or DBP>110    Hyperlipidemia associated with type 2 diabetes mellitus (HCC) High dose statin  Gastroesophageal reflux disease without esophagitis PPI  Type 2 diabetes mellitus without complications (HCC) Blood sugar 300s  SSI  A1C       Advance Care Planning:   Code Status: Full Code   Consults: Neuro   Family Communication: Family at the bedside   Severity of Illness: The appropriate patient status for this patient is INPATIENT. Inpatient status is judged to be reasonable and necessary in order to provide the required intensity of service to ensure the patient's safety. The patient's presenting symptoms, physical exam findings, and initial radiographic and laboratory data in the context of their chronic comorbidities is felt to place them at high risk for further clinical deterioration. Furthermore, it is not anticipated that the patient will be medically stable for discharge from the hospital within 2 midnights of admission.   * I certify that at the point of admission it is my clinical judgment that the patient will require  inpatient hospital care spanning beyond 2 midnights from the point of admission due to high intensity of service, high risk for further deterioration and high frequency of surveillance required.*  Author: Floydene Flock, MD 08/08/2023 2:31 PM  For on call review [email or web address].

## 2023-08-08 NOTE — Assessment & Plan Note (Signed)
Blood sugar 300s  SSI  A1C

## 2023-08-08 NOTE — Assessment & Plan Note (Signed)
High-dose statin ?

## 2023-08-08 NOTE — ED Notes (Signed)
Pt ambulated to restroom and back with walker assistance without incident

## 2023-08-08 NOTE — Assessment & Plan Note (Signed)
PPI ?

## 2023-08-08 NOTE — Consult Note (Signed)
Requesting Physician: Alvester Morin    Chief Complaint: Difficulty with balance  I have been asked by Dr. Alvester Morin to see this patient in consultation for acute infarct.  HPI: Ivan Kennedy is an 77 y.o. male with a history of DM, HTN, HLD and sleep apnea who was in his usual state of health until about 1100 on 12/23.  At that time noted that he bgan to be off balance and have difficulty with gait.  Has persisted and presented today for evaluation.    Date last known well: Date: 08/06/2023 Time last known well: Time: 11:00 tPA Given: No: Outside time window Thrombectomy Candidate: No, outside time window, infarct apparent on imaging  Past Medical History:  Diagnosis Date   Colon polyp 2005   Diabetes mellitus (HCC)    GERD (gastroesophageal reflux disease)    Hyperlipidemia    Hypertension    Rosacea    Sleep apnea    CPAP    Past Surgical History:  Procedure Laterality Date   ANAL FISSURE REPAIR  1998   BROW LIFT Bilateral 09/02/2021   Procedure: BLEPHAROPLASTY UPPER EYELID; W/EXCESS SKIN ECTROPION REPAIR, EXTENSIVE WEDGE EXCISION, UPPER BILATERAL;  Surgeon: Imagene Riches, MD;  Location: [facility];  Service: Ophthalmology;  Laterality: Bilateral;  Diabetic  sleep apnea   BROW LIFT Right 06/02/2022   Procedure: BLEPHAROPTOSIS REPAIR; RESECT EX RIGHT;  Surgeon: Imagene Riches, MD;  Location: [facility];  Service: Ophthalmology;  Laterality: Right;  Diabetic   CATARACT EXTRACTION W/PHACO Right 05/25/2021   Procedure: CATARACT EXTRACTION PHACO AND INTRAOCULAR LENS PLACEMENT (IOC) RIGHT DIABETIC 6.64 00:54.7;  Surgeon: Lockie Mola, MD;  Location: [facility];  Service: Ophthalmology;  Laterality: Right;  Diabetic, sleep apnea   CATARACT EXTRACTION W/PHACO Left 06/08/2021   Procedure: CATARACT EXTRACTION PHACO AND INTRAOCULAR LENS PLACEMENT (IOC) LEFT DIABETIC;  Surgeon: Lockie Mola, MD;  Location: [facility];  Service: Ophthalmology;   Laterality: Left;  Diabetic, sleep apnea 7.57 01:02.2   COLONOSCOPY  2005, 2010   COLONOSCOPY WITH PROPOFOL N/A 01/19/2020   Procedure: COLONOSCOPY WITH PROPOFOL;  Surgeon: Wyline Mood, MD;  Location: [facility];  Service: Gastroenterology;  Laterality: N/A;   COLONOSCOPY WITH PROPOFOL N/A 04/24/2023   Procedure: COLONOSCOPY WITH PROPOFOL;  Surgeon: Wyline Mood, MD;  Location: [facility];  Service: Gastroenterology;  Laterality: N/A;   ESOPHAGOGASTRODUODENOSCOPY (EGD) WITH PROPOFOL N/A 04/24/2023   Procedure: ESOPHAGOGASTRODUODENOSCOPY (EGD) WITH PROPOFOL;  Surgeon: Wyline Mood, MD;  Location: [facility];  Service: Gastroenterology;  Laterality: N/A;   POLYPECTOMY  04/24/2023   Procedure: POLYPECTOMY;  Surgeon: Wyline Mood, MD;  Location: [facility] ENDOSCOPY;  Service: Gastroenterology;;   REPLACEMENT TOTAL KNEE Right 07/2007   Dr. Deeann Saint   shoulder spurs Right 2000    Family History  Problem Relation Age of Onset   Diabetes Mother    Social History:  reports that he has never smoked. He has never used smokeless tobacco. He reports current alcohol use. He reports that he does not use drugs.  Allergies: No Known Allergies  Medications:  Prior to Admission medications   Medication Sig Start Date End Date Taking? Authorizing Provider  Accu-Chek Softclix Lancets lancets See admin instructions.    [provider]  ARTIFICIAL TEARS 1 % ophthalmic solution  09/05/21   [provider]  Azelaic Acid 15 % gel Apply topically every morning. 12/28/21   [provider]  Blood Glucose Monitoring Suppl (ONE TOUCH ULTRA 2) w/Device KIT To check blood sugar once  daily 03/25/20   Margaretann Loveless, PA-C  doxycycline (VIBRAMYCIN) 50 MG capsule Take 50 mg by mouth 2 (two) times daily.    [provider]  empagliflozin (JARDIANCE) 25 MG TABS tablet Take 1 tablet (25 mg total) by mouth daily before breakfast. 08/02/23   Bacigalupo, Marzella Schlein, MD  glucose blood  (ONETOUCH ULTRA) test strip CHECK BLOOD SUGAR ONCE DAILY 10/03/21   Erasmo Downer, MD  Lancets (ACCU-CHEK SOFT TOUCH) lancets To check blood glucose daily 08/22/17   Margaretann Loveless, PA-C  loratadine (CLARITIN) 10 MG tablet Take 10 mg by mouth daily.    [provider]  losartan (COZAAR) 100 MG tablet Take 1 tablet (100 mg total) by mouth daily. 02/22/23   Erasmo Downer, MD  metoprolol succinate (TOPROL-XL) 25 MG 24 hr tablet Take 1 tablet (25 mg total) by mouth daily. 02/22/23   Erasmo Downer, MD  metroNIDAZOLE (METROGEL) 1 % gel Apply 1 application topically every morning. 03/30/15   [provider]  neomycin-polymyxin b-dexamethasone (MAXITROL) 3.5-10000-0.1 OINT Place 1 Application into both eyes at bedtime. 10/12/22   [provider]  omeprazole (PRILOSEC) 40 MG capsule TAKE 1 CAPSULE BY MOUTH EVERY DAY 05/15/23   Bacigalupo, Marzella Schlein, MD  [facility] ULTRA test strip CHECK BLOOD SUGAR ONCE DAILY 10/05/21   Erasmo Downer, MD  pioglitazone (ACTOS) 30 MG tablet Take 1 tablet (30 mg total) by mouth daily. 08/02/23   Erasmo Downer, MD  POLYTRIM ophthalmic solution 1 drop 4 (four) times daily. 12/27/21   [provider]  simvastatin (ZOCOR) 20 MG tablet Take 1 tablet (20 mg total) by mouth daily at 6 PM. 08/02/23   Bacigalupo, Marzella Schlein, MD  tamsulosin (FLOMAX) 0.4 MG CAPS capsule Take 2 capsules (0.8 mg total) by mouth daily. 08/02/23   Erasmo Downer, MD  tirzepatide [facility]) 2.5 MG/0.5ML Pen Inject 2.5 mg into the skin once a week. Take in place of Rybelsus 08/06/23   Malva Limes, MD  tretinoin (RETIN-A) 0.05 % cream Apply 1 application  topically at bedtime. 02/22/23   Erasmo Downer, MD  vitamin B-12 (CYANOCOBALAMIN) 1000 MCG tablet Take 1,000 mcg by mouth daily.    [provider]  XDEMVY 0.25 % SOLN Apply 1 drop to eye 4 (four) times daily. 12/05/22   [provider]     ROS: History obtained  from the patient  General ROS: negative for - chills, fatigue, fever, night sweats, weight gain or weight loss Psychological ROS: negative for - behavioral disorder, hallucinations, memory difficulties, mood swings or suicidal ideation Ophthalmic ROS: negative for - blurry vision, double vision, eye pain or loss of vision ENT ROS: negative for - epistaxis, nasal discharge, oral lesions, sore throat, tinnitus or vertigo Allergy and Immunology ROS: negative for - hives or itchy/watery eyes Hematological and Lymphatic ROS: negative for - bleeding problems, bruising or swollen lymph nodes Endocrine ROS: negative for - galactorrhea, hair pattern changes, polydipsia/polyuria or temperature intolerance Respiratory ROS: negative for - cough, hemoptysis, shortness of breath or wheezing Cardiovascular ROS: right leg edema Gastrointestinal ROS: negative for - abdominal pain, diarrhea, hematemesis, nausea/vomiting or stool incontinence Genito-Urinary ROS: negative for - dysuria, hematuria, incontinence or urinary frequency/urgency Musculoskeletal ROS: negative for - joint swelling or muscular weakness Neurological ROS: as noted in HPI Dermatological ROS: negative for rash and skin lesion changes   Physical Examination: Blood pressure (!) 148/90, pulse 73, temperature (!) 97.5 F (36.4 C), temperature source Oral, resp.  rate 16, height 6\' 1"  (1.854 m), weight 84.4 kg, SpO2 96%.  HEENT-  Normocephalic, no lesions, without obvious abnormality.  Normal external eye and conjunctiva.  Normal auditory canals and external ears.  Cardiovascular- Single S1, S2 Lungs- CTA Abdomen-  soft, nontender Extremities-  LLE edema  Neurological Examination   Mental Status: Alert, oriented, thought content appropriate.  Speech fluent without evidence of aphasia.  Able to follow 3 step commands without difficulty. Cranial Nerves: II: Discs flat bilaterally; Visual fields grossly normal, pupils equal, round, reactive to  light and accommodation III,IV, VI: ptosis not present, extra-ocular motions intact bilaterally V,VII: smile symmetric, facial light touch sensation normal bilaterally VIII: hearing normal bilaterally XI: bilateral shoulder shrug XII: midline tongue extension Motor: Right : Upper extremity   5/5    Left:     Upper extremity   5/5  Lower extremity   5/5     Lower extremity   5/5 Tone and bulk:normal tone throughout; no atrophy noted Sensory: Pinprick and light touch intact throughout, bilaterally Deep Tendon Reflexes: 2+ and symmetric in the upper extremities, absent in the lower extremities Plantars: Right: mute   Left: mute Cerebellar: normal finger-to-nose and normal heel-to-shin testing bilaterally Gait: not tested due to safety concerns   NIHSS of 0  Laboratory Studies:  Basic Metabolic Panel: Recent Labs  Lab 08/02/23 0915 08/08/23 1226  NA 145* 139  K 3.7 4.2  CL 108* 104  CO2 21 23  GLUCOSE 132* 304*  BUN 11 18  CREATININE 0.96 1.10  CALCIUM 9.2 9.1    Liver Function Tests: Recent Labs  Lab 08/02/23 0915 08/08/23 1226  AST 22 22  ALT 17 20  ALKPHOS 63 48  BILITOT 1.1 1.4*  PROT 6.0 6.4*  ALBUMIN 4.2 4.1   No results for input(s): "LIPASE", "AMYLASE" in the last 168 hours. No results for input(s): "AMMONIA" in the last 168 hours.  CBC: Recent Labs  Lab 08/08/23 1226  WBC 5.4  NEUTROABS 3.9  HGB 14.8  HCT 44.0  MCV 89.6  PLT 107*    Cardiac Enzymes: No results for input(s): "CKTOTAL", "CKMB", "CKMBINDEX", "TROPONINI" in the last 168 hours.  BNP: Invalid input(s): "POCBNP"  CBG: No results for input(s): "GLUCAP" in the last 168 hours.  Microbiology: Results for orders placed or performed during the hospital encounter of 01/15/20  SARS CORONAVIRUS 2 (TAT 6-24 HRS) Nasopharyngeal Nasopharyngeal Swab     Status: None   Collection Time: 01/15/20 11:09 AM   Specimen: Nasopharyngeal Swab  Result Value Ref Range Status   SARS Coronavirus 2  NEGATIVE NEGATIVE Final    Comment: (NOTE) SARS-CoV-2 target nucleic acids are NOT DETECTED. The SARS-CoV-2 RNA is generally detectable in upper and lower respiratory specimens during the acute phase of infection. Negative results do not preclude SARS-CoV-2 infection, do not rule out co-infections with other pathogens, and should not be used as the sole basis for treatment or other patient management decisions. Negative results must be combined with clinical observations, patient history, and epidemiological information. The expected result is Negative. Fact Sheet for Patients: HairSlick.no Fact Sheet for Healthcare Providers: quierodirigir.com This test is not yet approved or cleared by the [place] FDA and  has been authorized for detection and/or diagnosis of SARS-CoV-2 by FDA under an Emergency Use Authorization (EUA). This EUA will remain  in effect (meaning this test can be used) for the duration of the COVID-19 declaration under Section 56 4(b)(1) of the Act, 21 U.S.C. section 360bbb-3(b)(1), unless  the authorization is terminated or revoked sooner. Performed at [facility], [address], [place], [place] [address]     Coagulation Studies: Recent Labs    08/08/23 1226  LABPROT 14.7  INR 1.1    Urinalysis: No results for input(s): "COLORURINE", "LABSPEC", "PHURINE", "GLUCOSEU", "HGBUR", "BILIRUBINUR", "KETONESUR", "PROTEINUR", "UROBILINOGEN", "NITRITE", "LEUKOCYTESUR" in the last 168 hours.  Invalid input(s): "APPERANCEUR"  Lipid Panel:    Component Value Date/Time   CHOL 112 08/02/2023 0915   TRIG 69 08/02/2023 0915   HDL 49 08/02/2023 0915   CHOLHDL 2.2 07/27/2022 0837   CHOLHDL 2.4 04/26/2017 0902   LDLCALC 48 08/02/2023 0915   LDLCALC 38 04/26/2017 0902    HgbA1C:  Lab Results  Component Value Date   HGBA1C 7.5 (H) 08/02/2023    Urine Drug Screen:  No results found for: "LABOPIA",  "COCAINSCRNUR", "LABBENZ", "AMPHETMU", "THCU", "LABBARB"  Alcohol Level: No results for input(s): "ETH" in the last 168 hours.  Other results: EKG: NSR at 71 bpm.  Imaging: CT HEAD WO CONTRAST Addendum Date: 08/08/2023 ADDENDUM REPORT: 08/08/2023 13:03 ADDENDUM: Study discussed by telephone with Dr. Claudell Kyle on 08/08/2023 at 1257 hours. Electronically Signed   By: Odessa Fleming M.D.   On: 08/08/2023 13:03   Result Date: 08/08/2023 CLINICAL DATA:  77 year old male with headache, dizziness, loss of balance. EXAM: CT HEAD WITHOUT CONTRAST TECHNIQUE: Contiguous axial images were obtained from the base of the skull through the vertex without intravenous contrast. RADIATION DOSE REDUCTION: This exam was performed according to the departmental dose-optimization program which includes automated exposure control, adjustment of the mA and/or kV according to patient size and/or use of iterative reconstruction technique. COMPARISON:  None Available. FINDINGS: Brain: Cerebral volume is within normal limits for age. Acute to subacute appearing right cerebellum PICA territory infarct, in an area of about 2.3 cm. No hemorrhagic transformation. No posterior fossa mass effect. No superimposed ventriculomegaly, evidence of mass lesion, intracranial hemorrhage or supratentorial cortically based acute infarction. Mild for age periventricular white matter hypodensity. Vascular: Calcified atherosclerosis at the skull base. Intracranial artery tortuosity. No suspicious intracranial vascular hyperdensity. Skull: Intact.  No acute osseous abnormality identified. Sinuses/Orbits: Tympanic cavities, Visualized paranasal sinuses and mastoids are clear. Other: Visualized orbits and scalp soft tissues are within normal limits. IMPRESSION: 1. 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 No hemorrhagic transformation or mass effect. 2. No other acute intracranial abnormality. Mild for age supratentorial white matter  changes most commonly due to small vessel disease. Electronically Signed: By: Odessa Fleming M.D. On: 08/08/2023 12:53    Assessment: 77 y.o. male with a history of DM, HTN, HLD and sleep apnea presenting with a two day history of difficulty with gait.  Head CT personally reviewed and reveals a hypodensity in the right cerebellum consistent with an acute to subacute infarct.  Infarct likely secondary to small vessel disease.  Patient on no antiplatelet or anticoagulation therapy.  Recent (12/19) A1c 7.5, LDL 48.  Stroke Risk Factors - diabetes mellitus, hyperlipidemia, and hypertension  Plan: 1. Cerebral vasculature to be visualized with either a CTA or MRA of the head and neck.   2. MRI of the brain without contrast 3. PT consult, OT consult, Speech consult 4. Echocardiogram 5. Prophylactic therapy-Dual antiplatelet therapy with ASA 81mg  and Plavix 75mg  for three weeks with change to ASA 81mg  alone as monotherapy after that time.  6. NPO until RN stroke swallow screen 7. Telemetry monitoring 8. Frequent neuro checks 9. Would  change Zocor to a high intensity statin for stroke prophylaxis 10. Elevated blood sugars are being addressed.  Goal A1c<7.0.   Case discussed with Dr. Sharen Counter, MD Neurology  08/08/2023, 2:52 PM

## 2023-08-08 NOTE — Assessment & Plan Note (Addendum)
Allow for permissive HTN in setting of CVA evaluation  Prn IV labetalol for SBP>220 or DBP>110

## 2023-08-08 NOTE — ED Triage Notes (Signed)
Pt to ED for multiple complaints. Headache, indigestion, has been off balance since Monday, and has intermittent "dizzy spells" and involuntarily leaning to R side and sometimes has to catch himself falling (since 2 days ago).   Hx DM. No blood thinners, used to take 81mg  aspirin but was taken off 6 months ago.  No arm drift, no facial asymmetry or staggering noted at this time.

## 2023-08-08 NOTE — ED Provider Notes (Signed)
[facility] Provider Note    Event Date/Time   First MD Initiated Contact with Patient 08/08/23 1235     (approximate)   History   Transient Ischemic Attack and multiple complaints   HPI Ivan Kennedy is a 77 y.o. male with DM2, HTN, HLD presenting today for dizziness.  Patient states since Monday he has had difficulty with walking where he stumbles a lot and notices both falling both to the right and left side.  States he usually catches himself and does not completely fall.  Does not use a walker or cane at baseline.  Also notes a room spinning sensation.  Otherwise denies vision changes, chest pain, shortness of breath, abdominal pain, numbness or weakness.  No prior history of stroke.     Physical Exam   Triage Vital Signs: ED Triage Vitals  Encounter Vitals Group     BP 08/08/23 1224 (!) 148/90     Systolic BP Percentile --      Diastolic BP Percentile --      Pulse Rate 08/08/23 1224 73     Resp 08/08/23 1224 16     Temp 08/08/23 1224 (!) 97.5 F (36.4 C)     Temp Source 08/08/23 1224 Oral     SpO2 08/08/23 1224 96 %     Weight 08/08/23 1220 224 lb 13.9 oz (102 kg)     Height 08/08/23 1220 6\' 1"  (1.854 m)     Head Circumference --      Peak Flow --      Pain Score 08/08/23 1221 0     Pain Loc --      Pain Education --      Exclude from Growth Chart --     Most recent vital signs: Vitals:   08/08/23 1224  BP: (!) 148/90  Pulse: 73  Resp: 16  Temp: (!) 97.5 F (36.4 C)  SpO2: 96%   Physical Exam: I have reviewed the vital signs and nursing notes. General: Awake, alert, no acute distress.  Nontoxic appearing. Head:  Atraumatic, normocephalic.   ENT:  EOM intact, PERRL. Oral mucosa is pink and moist with no lesions. Neck: Neck is supple with full range of motion, No meningeal signs. Cardiovascular:  RRR, No murmurs. Peripheral pulses palpable and equal bilaterally. Respiratory:  Symmetrical chest wall expansion.  No rhonchi,  rales, or wheezes.  Good air movement throughout.  No use of accessory muscles.   Musculoskeletal:  No cyanosis or edema. Moving extremities with full ROM Abdomen:  Soft, nontender, nondistended. Neuro:  GCS 15, moving all four extremities, interacting appropriately. Alert and oriented with cogent speech; 5/5 motor strength all 4 extremities with intact peripheral nerve distributions; gross sensory intact to challenge in all 4 extremities and peripheral nerve distributions; no clonus; cerebellar exam notable for abnormal Romberg as well as slight ataxia.  Otherwise normal finger-to-nose testing.  Dysdiadochokinesis or dysmetria; cranial nerves 2-12 intact to gross challenge bilaterally. Psych:  Calm, appropriate.   Skin:  Warm, dry, no rash.    ED Results / Procedures / Treatments   Labs (all labs ordered are listed, but only abnormal results are displayed) Labs Reviewed  CBC - Abnormal; Notable for the following components:      Result Value   Platelets 107 (*)    All other components within normal limits  COMPREHENSIVE METABOLIC PANEL - Abnormal; Notable for the following components:   Glucose, Bld 304 (*)    Total Protein 6.4 (*)  Total Bilirubin 1.4 (*)    All other components within normal limits  PROTIME-INR  APTT  DIFFERENTIAL  ETHANOL  CBG MONITORING, ED     EKG My EKG interpretation: Rate of 71, normal sinus rhythm.  Left bundle branch block.  No acute ST elevations or depressions.   RADIOLOGY Independently interpreted CT head with evidence of new right-sided cerebellar stroke   PROCEDURES:  Critical Care performed: Yes, see critical care procedure note(s)  .Critical Care  Performed by: Janith Lima, MD Authorized by: Janith Lima, MD   Critical care provider statement:    Critical care time (minutes):  30   Critical care was necessary to treat or prevent imminent or life-threatening deterioration of the following conditions: stroke.   Critical care was  time spent personally by me on the following activities:  Development of treatment plan with patient or surrogate, discussions with consultants, evaluation of patient's response to treatment, examination of patient, ordering and review of laboratory studies, ordering and review of radiographic studies, ordering and performing treatments and interventions, pulse oximetry, re-evaluation of patient's condition and review of old charts   I assumed direction of critical care for this patient from another provider in my specialty: no     Care discussed with: admitting provider      MEDICATIONS ORDERED IN ED: Medications - No data to display   IMPRESSION / MDM / ASSESSMENT AND PLAN / ED COURSE  I reviewed the triage vital signs and the nursing notes.                              Differential diagnosis includes, but is not limited to, CVA, orthostatic hypotension, peripheral vertigo  Patient's presentation is most consistent with acute presentation with potential threat to life or bodily function.  Patient is a 77 year old male presenting today for dizziness and ataxia spells x 48 hours.  Similar symptoms 2 to 3 weeks ago as well which resolved.  Exam today shows positive Romberg as well as slight ataxia with walking but does not completely fall.  Vital signs otherwise stable.  CT imaging of the head shows acute versus subacute right cerebellar infarct with no hemorrhage or mass effect.  This does correlate with his symptoms today.  Discussed case with Dr. Thad Ranger with neurology.  Patient will be admitted and they will come evaluate patient at this time for further workup.  Hospitalist was agreed to admit patient for further care.  The patient is on the cardiac monitor to evaluate for evidence of arrhythmia and/or significant heart rate changes. 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 [DW]     Clinical Course User Index [DW] Janith Lima, MD     FINAL CLINICAL IMPRESSION(S) / ED DIAGNOSES   Final diagnoses:  Acute CVA (cerebrovascular accident) (HCC)  Ataxia  Dizziness     Rx / DC Orders   ED Discharge Orders     None        Note:  This document was prepared using Dragon voice recognition software and may include unintentional dictation errors.   Janith Lima, MD 08/08/23 [phone]

## 2023-08-08 NOTE — Assessment & Plan Note (Addendum)
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

## 2023-08-09 ENCOUNTER — Telehealth: Payer: Self-pay | Admitting: Family Medicine

## 2023-08-09 ENCOUNTER — Other Ambulatory Visit: Payer: Self-pay

## 2023-08-09 ENCOUNTER — Observation Stay (HOSPITAL_BASED_OUTPATIENT_CLINIC_OR_DEPARTMENT_OTHER)
Admit: 2023-08-09 | Discharge: 2023-08-09 | Disposition: A | Discharge status: Home / Self Care | Payer: PPO | Attending: Family Medicine | Admitting: Family Medicine

## 2023-08-09 ENCOUNTER — Telehealth: Payer: PPO | Admitting: Family Medicine

## 2023-08-09 DIAGNOSIS — I639 Cerebral infarction, unspecified: Secondary | ICD-10-CM | POA: Diagnosis not present

## 2023-08-09 DIAGNOSIS — I63539 Cerebral infarction due to unspecified occlusion or stenosis of unspecified posterior cerebral artery: Secondary | ICD-10-CM

## 2023-08-09 DIAGNOSIS — I6389 Other cerebral infarction: Secondary | ICD-10-CM | POA: Diagnosis not present

## 2023-08-09 LAB — LIPID PANEL
Cholesterol: 106 mg/dL (ref 0–200)
HDL: 40 mg/dL — ABNORMAL LOW (ref >40)
LDL Cholesterol: 46 mg/dL (ref 0–99)
Total CHOL/HDL Ratio: 2.7 {ratio}
Triglycerides: 101 mg/dL (ref <150)
VLDL: 20 mg/dL (ref 0–40)

## 2023-08-09 LAB — ECHOCARDIOGRAM COMPLETE
AR max vel: 3.04 cm2
AV Area VTI: 3.1 cm2
AV Area mean vel: 3.11 cm2
AV Mean grad: 5 mm[Hg]
AV Peak grad: 8.8 mm[Hg]
Ao pk vel: 1.48 m/s
Area-P 1/2: 4.49 cm2
Height: 73 in
MV VTI: 3.2 cm2
S' Lateral: 2.9 cm
Weight: 2976 [oz_av]

## 2023-08-09 LAB — CBG MONITORING, ED
Glucose-Capillary: 121 mg/dL — ABNORMAL HIGH (ref 70–99)
Glucose-Capillary: 102 mg/dL — ABNORMAL HIGH (ref 70–99)

## 2023-08-09 MED ORDER — ASPIRIN 81 MG PO TBEC: 81.0000 mg | DELAYED_RELEASE_TABLET | Freq: Every day | ORAL | 3 refills | Status: DC

## 2023-08-09 MED ORDER — ATORVASTATIN CALCIUM 40 MG PO TABS: 40.0000 mg | ORAL_TABLET | Freq: Every day | ORAL | 0 refills | Status: DC

## 2023-08-09 MED ORDER — ATORVASTATIN CALCIUM 20 MG PO TABS: 40.0000 mg | ORAL_TABLET | Freq: Every day | ORAL | Status: DC

## 2023-08-09 MED ORDER — CLOPIDOGREL BISULFATE 75 MG PO TABS: 75.0000 mg | ORAL_TABLET | Freq: Every day | ORAL | 0 refills | Status: AC

## 2023-08-09 NOTE — Telephone Encounter (Signed)
Received fax from [employer] for [facility] 2.5mg ./0.81ml  Key:  [id number]

## 2023-08-09 NOTE — Progress Notes (Signed)
OT Cancellation Note  Patient Details Name: ANDREA GASTON MRN: [id number] DOB: 05-30-1946   Cancelled Treatment:    Reason Eval/Treat Not Completed: OT screened, no needs identified, will sign off. Pt worked well with PT ambulating with no AD. He and wife present during OT discussion and pt/wife declining need for OT services, therefore screen performed and OT to sign off. Can place new orders if new needs arise and a formal eval is warranted.   Emeline Darling Acelynn Dejonge 08/09/2023, 1:19 PM

## 2023-08-09 NOTE — Progress Notes (Signed)
SLP Cancellation Note  Patient Details Name: Ivan Kennedy MRN: [id number] DOB: 1946/02/28   Cancelled treatment:       Reason Eval/Treat Not Completed: SLP screened, no needs identified, will sign off. Patient, family and nsg report no needs with regard to speech, language, cognitive communication. Passed [employer] and is tolerating regular diet.SLP to s/o.  Rondel Baton, MS, [employer] Pathologist [phone]    Ivan Kennedy 08/09/2023, 11:14 AM

## 2023-08-09 NOTE — Progress Notes (Signed)
Physical Therapy Evaluation Patient Details Name: Ivan Kennedy MRN: [id number] DOB: March 05, 1946 Today's Date: 08/09/2023  History of Present Illness  77 y.o. male with a history of DM, HTN, HLD and sleep apnea who was in his usual state of health until about 1100 on 12/23.  e began to be off balance and have difficulty with gait, mutiple times needing to use wall to hold him up (staggering to the R).  Infarct seen on imaging.  Clinical Impression  Pt moving well, displays no strength or coordination asymmetry with testing, showed good confidence and safety with static and dynamic balance challenges, also showed good safety and confidence with ~300 ft of ambulation w/o AD with speed and directional changes, high step marching, stops, turns, etc, possibly very mild limp/decreased stance on R with no staggering, LOBs or overt safety concerns.  Pt reports not feeling at baseline, but better than on arrival.  Managed balance and mobility challenges well, will benefit from continued PT to facilitate return to high level baseline activity.        If plan is discharge home, recommend the following:     Can travel by private vehicle        Equipment Recommendations None recommended by PT  Recommendations for Other Services       Functional Status Assessment Patient has had a recent decline in their functional status and demonstrates the ability to make significant improvements in function in a reasonable and predictable amount of time.     Precautions / Restrictions Precautions Precautions: Fall Restrictions Weight Bearing Restrictions Per Provider Order: No      Mobility  Bed Mobility Overal bed mobility: Independent                  Transfers Overall transfer level: Independent Equipment used: None               General transfer comment: easily rises to standing w/o issue    Ambulation/Gait Ambulation/Gait assistance: Modified independent (Device/Increase  time) Gait Distance (Feet): 300 Feet Assistive device: None     Gait velocity interpretation: >2.62 ft/sec, indicative of community ambulatory   General Gait Details: briefly with HHA to start, but quickly apparent he did not require UE support.  Able to ambulate at community appropriate speeds with stops, turns, head turns, high step, speed changes, etc w/o issue.  Very minimally decreased stance time (mild limp) on R with no LOBs or instability  Stairs            Wheelchair Mobility     Tilt Bed    Modified Rankin (Stroke Patients Only)       Balance Overall balance assessment: Modified Independent (Eyes closed NBOS with mod perturbations, able to maintain balance weel, (-) Trendel)                                           Pertinent Vitals/Pain Pain Assessment Pain Assessment: No/denies pain    Home Living Family/patient expects to be discharged to:: Private residence Living Arrangements: Spouse/significant other Available Help at Discharge: Family;Available 24 hours/day   Home Access: Stairs to enter Entrance Stairs-Rails: Can reach both Entrance Stairs-Number of Steps: 3     Home Equipment: Rolling Walker (2 wheels)      Prior Function Prior Level of Function : Independent/Modified Independent;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)  Mobility Comments: 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, out of house daily ADLs Comments: independent     Extremity/Trunk Assessment   Upper Extremity Assessment Upper Extremity Assessment: Overall WFL for tasks assessed (no asymmetry or focal weakness)    Lower Extremity Assessment Lower Extremity Assessment: Overall WFL for tasks assessed (no asymmetry or focal weakness)       Communication   Communication Communication: No apparent difficulties  Cognition Arousal: Alert Behavior During Therapy: WFL for tasks assessed/performed Overall Cognitive Status: Within Functional Limits for tasks  assessed                                          General Comments General comments (skin integrity, edema, etc.): Pt with confident, safe and independent mobility/ambulation with no LOBs.  Orthostatics:  supine 150s/80s, sitting 157/88, standing 154/89 - asymptomatic    Exercises     Assessment/Plan    PT Assessment Patient needs continued PT services  PT Problem List Decreased balance       PT Treatment Interventions DME instruction;Gait training;Stair training;Functional mobility training;Therapeutic activities;Therapeutic exercise;Balance training;Patient/family education    PT Goals (Current goals can be found in the Care Plan section)  Acute Rehab PT Goals Patient Stated Goal: go home PT Goal Formulation: With patient/family Time For Goal Achievement: 08/22/23 Potential to Achieve Goals: Good    Frequency Min 1X/week     Co-evaluation               AM-PAC PT "6 Clicks" Mobility  Outcome Measure Help needed turning from your back to your side while in a flat bed without using bedrails?: None Help needed moving from lying on your back to sitting on the side of a flat bed without using bedrails?: None Help needed moving to and from a bed to a chair (including a wheelchair)?: None Help needed standing up from a chair using your arms (e.g., wheelchair or bedside chair)?: None Help needed to walk in hospital room?: A Little Help needed climbing 3-5 steps with a railing? : A Little 6 Click Score: 22    End of Session Equipment Utilized During Treatment: Gait belt Activity Tolerance: Patient tolerated treatment well Patient left: in bed;with call bell/phone within reach;with family/visitor present Nurse Communication: Mobility status PT Visit Diagnosis: Unsteadiness on feet (R26.81);Other symptoms and signs involving the nervous system (R29.898)    Time: 7846-9629 PT Time Calculation (min) (ACUTE ONLY): 28 min   Charges:   PT Evaluation $PT  Eval Low Complexity: 1 Low PT Treatments $Gait Training: 8-22 mins PT General Charges $$ ACUTE PT VISIT: 1 Visit         Malachi Pro, DPT 08/09/2023, 1:50 PM

## 2023-08-09 NOTE — Discharge Summary (Signed)
Physician Discharge Summary   Patient: Ivan Kennedy MRN: [id number] DOB: 05-Sep-1945  Admit date:     08/08/2023  Discharge date: 08/09/23  Discharge Physician: Debarah Crape   PCP: Erasmo Downer, MD   Recommendations at discharge:   Follow up with primary care physician in 1 to 2 weeks for blood pressure and diabetes control  Discharge Diagnoses: Principal Problem:   CVA (cerebral vascular accident) [facility]) Active Problems:   Type 2 diabetes mellitus without complications (HCC)   Gastroesophageal reflux disease without esophagitis   Hyperlipidemia associated with type 2 diabetes mellitus (HCC)   Hypertension associated with diabetes (HCC)  Resolved Problems:   * No resolved hospital problems. *  Hospital Course: Ivan Kennedy is 77 y.o. male with type 2 diabetes, GERD, hypertension, hyperlipidemia, presenting with difficulty with ambulation, intermittent ataxia, mild dizziness.  On arrival CT head with acute or subacute right cerebellum PICA territory infarct.  EKG normal sinus rhythm with left bundle branch block.  Patient was admitted for further workup.  Neurology was consulted.  Brain MRI revealed acute infarct in the setting of a more proximal PICA stenosis, also revealed some chronic microvascular changes.  Echocardiogram was performed which did not reveal any embolic source, LVEF 60 to 65%, grade 1 diastolic dysfunction.  No regional wall motion abnormalities appreciated.  He was initiated on dual antiplatelet therapy which he will need to continue for 90 days and proceed with monotherapy with aspirin.  LDL was found to be at goal, but we will add high intensity statin for best prevention.  Patient will need close outpatient follow-up with his primary care physician for blood pressure control and diabetes control.  This was discussed with him extensively. We also discussed his new diagnosis of heart failure with preserved EF.  He endorses understanding.  Patient worked  with PT and OT during his stay but was not found to require skilled services.  Care plan was discussed extensively with him as well as with his wife who is at bedside for discussions.  He was given strict ER return precautions.  Will need to follow-up with primary care physician.   Consultants: Neurology Procedures performed: None Disposition: Home Diet recommendation:  Discharge Diet Orders (From admission, onward)     Start     Ordered   08/09/23 0000  Diet - low sodium heart healthy        08/09/23 1628           Cardiac diet DISCHARGE MEDICATION: Allergies as of 08/09/2023   No Known Allergies      Medication List     STOP taking these medications    doxycycline 50 MG capsule Commonly known as: VIBRAMYCIN   simvastatin 20 MG tablet Commonly known as: ZOCOR       TAKE these medications    Accu-Chek Softclix Lancets lancets See admin instructions.   accu-chek soft touch lancets To check blood glucose daily   Artificial Tears 1 % ophthalmic solution Generic drug: carboxymethylcellulose   aspirin EC 81 MG tablet Take 1 tablet (81 mg total) by mouth daily. Swallow whole. Start taking on: August 10, 2023   atorvastatin 40 MG tablet Commonly known as: LIPITOR Take 1 tablet (40 mg total) by mouth daily. Start taking on: August 10, 2023   Azelaic Acid 15 % gel Apply topically every morning.   clopidogrel 75 MG tablet Commonly known as: PLAVIX Take 1 tablet (75 mg total) by mouth daily. Start taking on: August 10, 2023  cyanocobalamin 1000 MCG tablet Commonly known as: VITAMIN B12 Take 1,000 mcg by mouth daily.   empagliflozin 25 MG Tabs tablet Commonly known as: Jardiance Take 1 tablet (25 mg total) by mouth daily before breakfast.   loratadine 10 MG tablet Commonly known as: CLARITIN Take 10 mg by mouth daily.   losartan 100 MG tablet Commonly known as: COZAAR Take 1 tablet (100 mg total) by mouth daily.   metoprolol succinate 25  MG 24 hr tablet Commonly known as: TOPROL-XL Take 1 tablet (25 mg total) by mouth daily.   metroNIDAZOLE 1 % gel Commonly known as: METROGEL Apply 1 application topically every morning.   neomycin-polymyxin b-dexamethasone 3.5-10000-0.1 Oint Commonly known as: MAXITROL Place 1 Application into both eyes at bedtime.   omeprazole 40 MG capsule Commonly known as: PRILOSEC TAKE 1 CAPSULE BY MOUTH EVERY DAY   ONE TOUCH ULTRA 2 w/Device Kit To check blood sugar once daily   OneTouch Ultra test strip Generic drug: glucose blood CHECK BLOOD SUGAR ONCE DAILY   OneTouch Ultra test strip Generic drug: glucose blood CHECK BLOOD SUGAR ONCE DAILY   pioglitazone 30 MG tablet Commonly known as: ACTOS Take 1 tablet (30 mg total) by mouth daily.   Polytrim ophthalmic solution Generic drug: trimethoprim-polymyxin b 1 drop 4 (four) times daily.   tamsulosin 0.4 MG Caps capsule Commonly known as: FLOMAX Take 2 capsules (0.8 mg total) by mouth daily.   tirzepatide 2.5 MG/0.5ML Pen Commonly known as: MOUNJARO Inject 2.5 mg into the skin once a week. Take in place of Rybelsus   tretinoin 0.05 % cream Commonly known as: RETIN-A Apply 1 application  topically at bedtime.   Xdemvy 0.25 % Soln Generic drug: Lotilaner Apply 1 drop to eye 4 (four) times daily.        Discharge Exam: Filed Weights   08/08/23 1220 08/08/23 1221  Weight: 102 kg 84.4 kg   Constitutional:  Normal appearance. Non toxic-appearing.  HENT: Head Normocephalic and atraumatic.  Mucous membranes are moist.  Eyes:  Extraocular intact. Conjunctivae normal. Pupils are equal, round, and reactive to light.  Cardiovascular: Rate and Rhythm: Normal rate and regular rhythm.  Pulmonary: Non labored, symmetric rise of chest wall.  Musculoskeletal:  Normal range of motion.  Skin: warm and dry. not jaundiced.  Neurological: No focal deficit present. alert. Oriented. Psychiatric: Mood and Affect congruent.    Condition  at discharge: good  The results of significant diagnostics from this hospitalization (including imaging, microbiology, ancillary and laboratory) are listed below for reference.   Imaging Studies: ECHOCARDIOGRAM COMPLETE Result Date: 08/09/2023    ECHOCARDIOGRAM REPORT   Patient Name:   Ivan Kennedy Date of Exam: 08/09/2023 Medical Rec #:  [id number]      Height:       73.0 in Accession #:    [id number]     Weight:       186.0 lb Date of Birth:  [birth year]      BSA:          2.086 m Patient Age:    77 years       BP:           156/81 mmHg Patient Gender: M              HR:           66 bpm. Exam Location:  [facility] Procedure: 2D Echo, Cardiac Doppler and Color Doppler Indications:     TIA  History:  Patient has no prior history of Echocardiogram examinations.                  TIA, Signs/Symptoms:Chest Pain; Risk Factors:Hypertension,                  Diabetes, Dyslipidemia and Sleep Apnea.  Sonographer:     Mikki Harbor Referring Phys:  [phone] Francoise Schaumann [place] Diagnosing Phys: Julien Nordmann MD IMPRESSIONS  1. Left ventricular ejection fraction, by estimation, is 60 to 65%. The left ventricle has normal function. The left ventricle has no regional wall motion abnormalities. There is moderate left ventricular hypertrophy. Left ventricular diastolic parameters are consistent with Grade I diastolic dysfunction (impaired relaxation).  2. Right ventricular systolic function is normal. The right ventricular size is normal.  3. The mitral valve is normal in structure. Mild mitral valve regurgitation. No evidence of mitral stenosis.  4. The aortic valve is normal in structure. Aortic valve regurgitation is not visualized. No aortic stenosis is present.  5. The inferior vena cava is normal in size with greater than 50% respiratory variability, suggesting right atrial pressure of 3 mmHg. FINDINGS  Left Ventricle: Left ventricular ejection fraction, by estimation, is 60 to 65%. The left ventricle has normal  function. The left ventricle has no regional wall motion abnormalities. The left ventricular internal cavity size was normal in size. There is  moderate left ventricular hypertrophy. Left ventricular diastolic parameters are consistent with Grade I diastolic dysfunction (impaired relaxation). Right Ventricle: The right ventricular size is normal. No increase in right ventricular wall thickness. Right ventricular systolic function is normal. Left Atrium: Left atrial size was normal in size. Right Atrium: Right atrial size was normal in size. Pericardium: There is no evidence of pericardial effusion. Mitral Valve: The mitral valve is normal in structure. Mild mitral valve regurgitation. No evidence of mitral valve stenosis. MV peak gradient, 4.7 mmHg. The mean mitral valve gradient is 1.0 mmHg. Tricuspid Valve: The tricuspid valve is normal in structure. Tricuspid valve regurgitation is not demonstrated. No evidence of tricuspid stenosis. Aortic Valve: The aortic valve is normal in structure. Aortic valve regurgitation is not visualized. No aortic stenosis is present. Aortic valve mean gradient measures 5.0 mmHg. Aortic valve peak gradient measures 8.8 mmHg. Aortic valve area, by VTI measures 3.10 cm. Pulmonic Valve: The pulmonic valve was normal in structure. Pulmonic valve regurgitation is not visualized. No evidence of pulmonic stenosis. Aorta: The aortic root is normal in size and structure. Venous: The inferior vena cava is normal in size with greater than 50% respiratory variability, suggesting right atrial pressure of 3 mmHg. IAS/Shunts: No atrial level shunt detected by color flow Doppler.  LEFT VENTRICLE PLAX 2D LVIDd:         3.90 cm   Diastology LVIDs:         2.90 cm   LV e' medial:    4.03 cm/s LV PW:         1.50 cm   LV E/e' medial:  12.8 LV IVS:        1.60 cm   LV e' lateral:   5.17 cm/s LVOT diam:     2.00 cm   LV E/e' lateral: 9.9 LV SV:         102 LV SV Index:   49 LVOT Area:     3.14 cm  RIGHT  VENTRICLE RV Basal diam:  3.85 cm RV Mid diam:    4.10 cm RV S prime:  15.30 cm/s TAPSE (M-mode): 2.2 cm LEFT ATRIUM             Index        RIGHT ATRIUM           Index LA diam:        3.40 cm 1.63 cm/m   RA Area:     21.40 cm LA Vol (A2C):   52.9 ml 25.36 ml/m  RA Volume:   64.70 ml  31.01 ml/m LA Vol (A4C):   68.3 ml 32.74 ml/m LA Biplane Vol: 61.4 ml 29.43 ml/m  AORTIC VALVE                     PULMONIC VALVE AV Area (Vmax):    3.04 cm      PV Vmax:       1.29 m/s AV Area (Vmean):   3.11 cm      PV Peak grad:  6.7 mmHg AV Area (VTI):     3.10 cm AV Vmax:           148.00 cm/s AV Vmean:          101.000 cm/s AV VTI:            0.328 m AV Peak Grad:      8.8 mmHg AV Mean Grad:      5.0 mmHg LVOT Vmax:         143.00 cm/s LVOT Vmean:        100.000 cm/s LVOT VTI:          0.324 m LVOT/AV VTI ratio: 0.99  AORTA Ao Root diam: 4.00 cm MITRAL VALVE MV Area (PHT): 4.49 cm     SHUNTS MV Area VTI:   3.20 cm     Systemic VTI:  0.32 m MV Peak grad:  4.7 mmHg     Systemic Diam: 2.00 cm MV Mean grad:  1.0 mmHg MV Vmax:       1.08 m/s MV Vmean:      56.0 cm/s MV Decel Time: 169 msec MV E velocity: 51.40 cm/s MV A velocity: 118.00 cm/s MV E/A ratio:  0.44 Julien Nordmann MD Electronically signed by Julien Nordmann MD Signature Date/Time: 08/09/2023/2:23:03 PM    Final    MR BRAIN WO CONTRAST Result Date: 08/08/2023 CLINICAL DATA:  Neuro deficit, acute stroke suspected. Abnormal CT. Dizziness and abnormal gait 2 days. EXAM: MRI HEAD WITHOUT CONTRAST TECHNIQUE: Multiplanar, multiecho pulse sequences of the brain and surrounding structures were obtained without intravenous contrast. COMPARISON:  CT head without contrast and CT angio head and neck 08/08/2023. FINDINGS: Brain: Diffusion-weighted images confirm an acute/subacute nonhemorrhagic infarct in the right PICA territory. No other acute infarcts are present. T2 and FLAIR signal changes are associated with a subacute time frame of 2 days in accordance with the  clinical history. Periventricular and scattered subcortical T2 hyperintensities are mildly advanced for age. The ventricles are of normal size. No significant extraaxial fluid collection is present. The brainstem and cerebellum are otherwise within normal limits. The internal auditory canals are within normal limits. Midline structures are within normal limits. Vascular: Flow is present in the major intracranial arteries. Skull and upper cervical spine: The craniocervical junction is normal. Upper cervical spine is within normal limits. Marrow signal is unremarkable. Sinuses/Orbits: A right mastoid effusion is present. No obstructing nasopharyngeal lesion is present. The paranasal sinuses and mastoid air cells are otherwise clear. Bilateral lens replacements are noted. Globes and orbits are otherwise unremarkable.  IMPRESSION: 1. Acute/subacute nonhemorrhagic infarct of the right PICA territory. 2. Periventricular and scattered subcortical T2 hyperintensities bilaterally are mildly advanced for age. The finding is nonspecific but can be seen in the setting of chronic microvascular ischemia, a demyelinating process such as multiple sclerosis, vasculitis, complicated migraine headaches, or as the sequelae of a prior infectious or inflammatory process. 3. Right mastoid effusion. Electronically Signed   By: Marin Roberts M.D.   On: 08/08/2023 15:46   CT ANGIO HEAD NECK W WO CM Result Date: 08/08/2023 CLINICAL DATA:  Provided history: Neuro deficit, acute, stroke suspected. Additional history provided: Dizziness, difficulty walking. EXAM: CT ANGIOGRAPHY HEAD AND NECK WITH AND WITHOUT CONTRAST TECHNIQUE: Multidetector CT imaging of the head and neck was performed using the standard protocol during bolus administration of intravenous contrast. Multiplanar CT image reconstructions and MIPs were obtained to evaluate the vascular anatomy. Carotid stenosis measurements (when applicable) are obtained utilizing NASCET  criteria, using the distal internal carotid diameter as the denominator. RADIATION DOSE REDUCTION: This exam was performed according to the departmental dose-optimization program which includes automated exposure control, adjustment of the mA and/or kV according to patient size and/or use of iterative reconstruction technique. CONTRAST:  75mL OMNIPAQUE IOHEXOL 350 MG/ML SOLN COMPARISON:  Head CT 08/08/2023. FINDINGS: CTA NECK FINDINGS Aortic arch: Standard aortic branching. Atherosclerotic plaque within the visualized thoracic aorta and proximal major branch vessels of the neck. 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 No hemodynamically significant innominate or proximal subclavian artery stenosis. Right carotid system: CCA and ICA patent within the neck without stenosis. Minimal atherosclerotic plaque within the proximal ICA. Left carotid system: CCA and ICA patent within the neck without stenosis. Minimal atherosclerotic plaque at the CCA origin and within the proximal ICA. Vertebral arteries: Patent within the neck without stenosis. Nonstenotic atherosclerotic plaque within the right vertebral artery at the vessel origin and at the V3/V4 junction. Skeleton: Cervical spondylosis. Nonspecific reversal of the expected cervical lordosis. C7-T1 grade 1 anterolisthesis. No acute fracture or aggressive osseous lesion. Other neck: No neck mass or cervical lymphadenopathy. Upper chest: No consolidation within the imaged lung apices. Review of the MIP images confirms the above findings CTA HEAD FINDINGS Anterior circulation: The intracranial internal carotid arteries are patent. Atherosclerotic plaque within both vessels with no more than mild stenosis. The M1 middle cerebral arteries are patent. No M2 proximal branch occlusion or high-grade proximal stenosis. The anterior cerebral arteries are patent. No intracranial aneurysm is  identified. Posterior circulation: The intracranial vertebral arteries are patent. 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 The basilar artery is patent. The posterior cerebral arteries are patent. Posterior communicating arteries are diminutive or absent, bilaterally. Venous sinuses: Within the limitations of contrast timing, no convincing thrombus. Anatomic variants: As described. Review of the MIP images confirms the above findings IMPRESSION: CTA neck: 1. The common carotid and internal carotid arteries are patent within the neck without stenosis. Mild atherosclerotic plaque bilaterally, as described. 2. The vertebral arteries are patent within the neck without stenosis. Minimal non-stenotic calcified plaque within the cervical right vertebral artery. 3. 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 Aortic Atherosclerosis (ICD10-I70.0). CTA head: 1. No proximal intracranial large vessel occlusion identified. 2. 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 3. Atherosclerotic plaque within the intracranial internal carotid arteries with no more than mild stenosis.  Electronically Signed   By: Jackey Loge D.O.   On: 08/08/2023 15:21   CT HEAD WO CONTRAST Addendum Date: 08/08/2023 ADDENDUM REPORT: 08/08/2023 13:03 ADDENDUM: Study discussed by telephone with Dr. Claudell Kyle on 08/08/2023 at 1257 hours. Electronically Signed   By: Odessa Fleming M.D.   On: 08/08/2023 13:03   Result Date: 08/08/2023 CLINICAL DATA:  77 year old male with headache, dizziness, loss of balance. EXAM: CT HEAD WITHOUT CONTRAST TECHNIQUE: Contiguous axial images were obtained from the base of the skull through the vertex without intravenous contrast. RADIATION DOSE REDUCTION: This exam was performed according to the departmental dose-optimization program which includes automated exposure control, adjustment of the  mA and/or kV according to patient size and/or use of iterative reconstruction technique. COMPARISON:  None Available. FINDINGS: Brain: Cerebral volume is within normal limits for age. Acute to subacute appearing right cerebellum PICA territory infarct, in an area of about 2.3 cm. No hemorrhagic transformation. No posterior fossa mass effect. No superimposed ventriculomegaly, evidence of mass lesion, intracranial hemorrhage or supratentorial cortically based acute infarction. Mild for age periventricular white matter hypodensity. Vascular: Calcified atherosclerosis at the skull base. Intracranial artery tortuosity. No suspicious intracranial vascular hyperdensity. Skull: Intact.  No acute osseous abnormality identified. Sinuses/Orbits: Tympanic cavities, Visualized paranasal sinuses and mastoids are clear. Other: Visualized orbits and scalp soft tissues are within normal limits. IMPRESSION: 1. 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 No hemorrhagic transformation or mass effect. 2. No other acute intracranial abnormality. Mild for age supratentorial white matter changes most commonly due to small vessel disease. Electronically Signed: By: Odessa Fleming M.D. On: 08/08/2023 12:53    Microbiology: Results for orders placed or performed during the hospital encounter of 01/15/20  SARS CORONAVIRUS 2 (TAT 6-24 HRS) Nasopharyngeal Nasopharyngeal Swab     Status: None   Collection Time: 01/15/20 11:09 AM   Specimen: Nasopharyngeal Swab  Result Value Ref Range Status   SARS Coronavirus 2 NEGATIVE NEGATIVE Final    Comment: (NOTE) SARS-CoV-2 target nucleic acids are NOT DETECTED. The SARS-CoV-2 RNA is generally detectable in upper and lower respiratory specimens during the acute phase of infection. Negative results do not preclude SARS-CoV-2 infection, do not rule out co-infections with other pathogens, and should not be used as the sole basis for treatment or other patient management  decisions. Negative results must be combined with clinical observations, patient history, and epidemiological information. The expected result is Negative. Fact Sheet for Patients: HairSlick.no Fact Sheet for Healthcare Providers: quierodirigir.com This test is not yet approved or cleared by the [place] FDA and  has been authorized for detection and/or diagnosis of SARS-CoV-2 by FDA under an Emergency Use Authorization (EUA). This EUA will remain  in effect (meaning this test can be used) for the duration of the COVID-19 declaration under Section 56 4(b)(1) of the Act, 21 U.S.C. section 360bbb-3(b)(1), unless the authorization is terminated or revoked sooner. Performed at [facility], [address] [place], [place] [address]     Labs: CBC: Recent Labs  Lab 08/08/23 1226 08/08/23 2028  WBC 5.4 5.4  NEUTROABS 3.9  --   HGB 14.8 13.8  HCT 44.0 39.5  MCV 89.6 88.8  PLT 107* 109*   Basic Metabolic Panel: Recent Labs  Lab 08/08/23 1226  NA 139  K 4.2  CL 104  CO2 23  GLUCOSE 304*  BUN 18  CREATININE 1.10  CALCIUM 9.1   Liver Function Tests: Recent Labs  Lab 08/08/23 1226  AST 22  ALT 20  ALKPHOS 48  BILITOT 1.4*  PROT 6.4*  ALBUMIN 4.1   CBG: Recent Labs  Lab 08/08/23 1632 08/08/23 2119 08/09/23 0717 08/09/23 1203  GLUCAP 200* 101* 121* 102*    Discharge time spent: greater than 30 minutes.  Signed: Debarah Crape, DO [facility] 08/09/2023

## 2023-08-09 NOTE — Hospital Course (Addendum)
BRANTLEY ROCHEFORT is 77 y.o. male with type 2 diabetes, GERD, hypertension, hyperlipidemia, presenting with difficulty with ambulation, intermittent ataxia, mild dizziness.  On arrival CT head with acute or subacute right cerebellum PICA territory infarct.  EKG normal sinus rhythm with left bundle branch block.  Patient was admitted for further workup.  Neurology was consulted.  Brain MRI revealed acute infarct in the setting of a more proximal PICA stenosis, also revealed some chronic microvascular changes.  Echocardiogram was performed which did not reveal any embolic source, LVEF 60 to 65%, grade 1 diastolic dysfunction.  No regional wall motion abnormalities appreciated.  He was initiated on dual antiplatelet therapy which he will need to continue for 90 days and proceed with monotherapy with aspirin.  LDL was found to be at goal, but we will add high intensity statin for best prevention.  Patient will need close outpatient follow-up with his primary care physician for blood pressure control and diabetes control.  This was discussed with him extensively. We also discussed his new diagnosis of heart failure with preserved EF.  He endorses understanding.  Patient worked with PT and OT during his stay but was not found to require skilled services.  Care plan was discussed extensively with him as well as with his wife who is at bedside for discussions.  He was given strict ER return precautions.  Will need to follow-up with primary care physician.

## 2023-08-09 NOTE — Progress Notes (Signed)
*  PRELIMINARY RESULTS* Echocardiogram 2D Echocardiogram has been performed.  Carolyne Fiscal 08/09/2023, 9:40 AM

## 2023-08-09 NOTE — Progress Notes (Signed)
NEUROLOGY CONSULT FOLLOW UP NOTE   Date of service: August 09, 2023 Patient Name: Ivan Kennedy MRN:  [id number] DOB:  Jul 11, 1946  Brief HPI  Ivan Kennedy is a 77 y.o. male with a history of DM, htn, hyperlipidemia, osa who presented on 08/08/2023 with  two days of balance and gait difficulty. 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    Interval Hx/subjective   Feels a little better today  Vitals   Vitals:   08/09/23 0400 08/09/23 0600 08/09/23 0700 08/09/23 0723  BP: (!) 150/81 139/70 (!) 156/81   Pulse: (!) 57 61 (!) 56   Resp: 19 (!) 0 12   Temp:    97.9 F (36.6 C)  TempSrc:    Oral  SpO2: 95% 93% 94%   Weight:      Height:         Body mass index is 24.54 kg/m.  Physical Exam   Constitutional: Appears well-developed and well-nourished.   Neurologic Examination   Gen: in bed, NAD  Neuro: MS: awake, alert,  WU:JWJX, VFF Motor: 5/5 throughout Sensory:intact to LT Cerebeller: slight difficujlty with FNF on the right  Labs and Diagnostic Imaging   CBC:  Recent Labs  Lab 08/08/23 1226 08/08/23 2028  WBC 5.4 5.4  NEUTROABS 3.9  --   HGB 14.8 13.8  HCT 44.0 39.5  MCV 89.6 88.8  PLT 107* 109*    Basic Metabolic Panel:  Lab Results  Component Value Date   NA 139 08/08/2023   K 4.2 08/08/2023   CO2 23 08/08/2023   GLUCOSE 304 (H) 08/08/2023   BUN 18 08/08/2023   CREATININE 1.10 08/08/2023   CALCIUM 9.1 08/08/2023   GFRNONAA >60 08/08/2023   GFRAA 84 11/06/2019   Lipid Panel:  Lab Results  Component Value Date   LDLCALC 46 08/09/2023   HgbA1c:  Lab Results  Component Value Date   HGBA1C 7.5 (H) 08/02/2023    Imaging(Personally reviewed): MRI brian - moderate sized PICA infarct   Impression   Ivan Kennedy is a 77 y.o. 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  Intrinsic medium/large vessel disease is possible but I would still favor getting an echo as well.  If this does not reveal any embolic  source, I would favor dual antiplatelet therapy for 90 days followed by monotherapy with aspirin.  He will need intensive statin therapy, but with an LDL already of 46, I would favor atorvastatin 40 mg as opposed to 80 mg.  Recommendations  1) DM management with goal A1C < 7 2) DAPT with asa 81mg  and clopidigrel 75mg  daily x 90 days then monotherapy with asa 81mg .  3) continue high intensity statin, but will reduce dose to atorvastatin 40 4) f/u echo 5) work with PCP as an outpatient for BP control  6) neurology will be available as needed. ______________________________________________________________________   Thank you for the opportunity to take part in the care of this patient. If you have any further questions, please contact the neurology consultation team on call. Updated oncall schedule is listed on AMION.  Signed,  Ritta Slot Neurohospitalist

## 2023-08-13 NOTE — Telephone Encounter (Signed)
Pt is calling to follow up on PA for medication Mounjaro.  Please advise.

## 2023-08-20 ENCOUNTER — Other Ambulatory Visit: Payer: Self-pay | Admitting: Family Medicine

## 2023-08-20 DIAGNOSIS — D485 Neoplasm of uncertain behavior of skin: Secondary | ICD-10-CM | POA: Diagnosis not present

## 2023-08-20 DIAGNOSIS — C44622 Squamous cell carcinoma of skin of right upper limb, including shoulder: Secondary | ICD-10-CM | POA: Diagnosis not present

## 2023-08-20 DIAGNOSIS — E119 Type 2 diabetes mellitus without complications: Secondary | ICD-10-CM

## 2023-08-20 NOTE — Telephone Encounter (Signed)
 Medication Refill -  Most Recent Primary Care Visit:  Provider: BACIGALUPO, ANGELA M  Department: [facility]  Visit Type: PHYSICAL  Date: 08/02/2023  Medication: tirzepatide  (MOUNJARO ) 2.5 MG/0.5ML Pen   Has the patient contacted their pharmacy? Yes Pharmacy stated they do not have the script  Is this the correct pharmacy for this prescription? Yes  This is the patient's preferred pharmacy:  [id number] - [place], [place] - [address] [place] [address] Phone: [phone] Fax: [phone]  Has the prescription been filled recently? Yes  Is the patient out of the medication? Yes  Has the patient been seen for an appointment in the last year OR does the patient have an upcoming appointment? Yes  Can we respond through MyChart? No  Agent: Please be advised that Rx refills may take up to 3 business days. We ask that you follow-up with your pharmacy.

## 2023-08-21 MED ORDER — TIRZEPATIDE 2.5 MG/0.5ML ~~LOC~~ SOAJ: 2.5000 mg | SUBCUTANEOUS | 0 refills | Status: DC

## 2023-08-21 NOTE — Telephone Encounter (Signed)
 Requested medication (s) are due for refill today - no  Requested medication (s) are on the active medication list -yes  Future visit scheduled -yes  Last refill: 08/06/23 2ml  Notes to clinic: off protocol- provider review   Requested Prescriptions  Pending Prescriptions Disp Refills   tirzepatide  (MOUNJARO ) 2.5 MG/0.5ML Pen 2 mL 0    Sig: Inject 2.5 mg into the skin once a week. Take in place of Rybelsus      Off-Protocol Failed - 08/21/2023  8:25 AM      Failed - Medication not assigned to a protocol, review manually.      Passed - Valid encounter within last 12 months    Recent Outpatient Visits           2 weeks ago Encounter for annual wellness visit (AWV) in Medicare patient   [place] [facility] Roe, Jon HERO, MD   6 months ago Type 2 diabetes mellitus with hyperglycemia, without long-term current use of insulin  [facility])   [place] [facility] [place], Jon HERO, MD   1 year ago Hypertension associated with diabetes [facility])   [place] [facility] Myrla, Jon HERO, MD   1 year ago Hypertension associated with diabetes [facility])   [place] [facility] [place], Jon HERO, MD   1 year ago Encounter for Medicare annual wellness exam   [place] [facility] Gwenn Jhonnie RAMAN, LPN       Future Appointments             In 6 days Bacigalupo, Jon HERO, MD [facility], [facility]   In 5 months Bacigalupo, Jon HERO, MD [facility], [facility]               Requested Prescriptions  Pending Prescriptions Disp Refills   tirzepatide  (MOUNJARO ) 2.5 MG/0.5ML Pen 2 mL 0    Sig: Inject 2.5 mg into the skin once a week. Take in place of Rybelsus      Off-Protocol Failed - 08/21/2023  8:25 AM      Failed - Medication not assigned to a protocol, review manually.      Passed - Valid encounter within last 12 months    Recent Outpatient Visits            2 weeks ago Encounter for annual wellness visit (AWV) in Medicare patient   [place] [facility] [place], Jon HERO, MD   6 months ago Type 2 diabetes mellitus with hyperglycemia, without long-term current use of insulin  [facility])   [place] [facility] [place], Jon HERO, MD   1 year ago Hypertension associated with diabetes [facility])   [place] [facility] Myrla, Jon HERO, MD   1 year ago Hypertension associated with diabetes [facility])   [place] [facility] [place], Jon HERO, MD   1 year ago Encounter for Medicare annual wellness exam   [facility] Gwenn Jhonnie RAMAN, LPN       Future Appointments             In 6 days Bacigalupo, Jon HERO, MD [facility], [facility]   In 5 months Bacigalupo, Jon HERO, MD [facility], [facility]

## 2023-08-22 NOTE — Telephone Encounter (Signed)
 Recieved a fax from covermymeds for Mounjaro 2.5MG /0.5ML Auto-injectors..PA has been started.   key: [id number]

## 2023-08-23 NOTE — Telephone Encounter (Signed)
 Duplicate encounter. Will continue in original encounter.

## 2023-08-23 NOTE — Telephone Encounter (Signed)
 He has now failed rybelsus, so I'd like to see if we can get it switched. We can refer to pharmacy if we need help with getting him medications

## 2023-08-24 ENCOUNTER — Other Ambulatory Visit: Payer: Self-pay | Admitting: *Deleted

## 2023-08-24 DIAGNOSIS — E119 Type 2 diabetes mellitus without complications: Secondary | ICD-10-CM

## 2023-08-24 NOTE — Telephone Encounter (Signed)
 Notified pt ---referral to pharmacy was placed-medication assistant. Pt verified just pick-up Mounjararo at the pharmacy last week  for $47.00 out of pocket, ok to continue to pay the amount.  Please advise if need to go forward w/ referral or cancel.

## 2023-08-27 ENCOUNTER — Ambulatory Visit: Payer: PPO | Admitting: Family Medicine

## 2023-08-27 ENCOUNTER — Encounter: Payer: Self-pay | Admitting: Family Medicine

## 2023-08-27 VITALS — BP 127/83 | HR 79 | Ht 73.0 in | Wt 223.8 lb

## 2023-08-27 DIAGNOSIS — E119 Type 2 diabetes mellitus without complications: Secondary | ICD-10-CM

## 2023-08-27 DIAGNOSIS — I152 Hypertension secondary to endocrine disorders: Secondary | ICD-10-CM | POA: Diagnosis not present

## 2023-08-27 DIAGNOSIS — E1169 Type 2 diabetes mellitus with other specified complication: Secondary | ICD-10-CM

## 2023-08-27 DIAGNOSIS — L719 Rosacea, unspecified: Secondary | ICD-10-CM

## 2023-08-27 DIAGNOSIS — Z7984 Long term (current) use of oral hypoglycemic drugs: Secondary | ICD-10-CM | POA: Diagnosis not present

## 2023-08-27 DIAGNOSIS — I5032 Chronic diastolic (congestive) heart failure: Secondary | ICD-10-CM

## 2023-08-27 DIAGNOSIS — E1159 Type 2 diabetes mellitus with other circulatory complications: Secondary | ICD-10-CM | POA: Diagnosis not present

## 2023-08-27 DIAGNOSIS — E785 Hyperlipidemia, unspecified: Secondary | ICD-10-CM | POA: Diagnosis not present

## 2023-08-27 DIAGNOSIS — Z8673 Personal history of transient ischemic attack (TIA), and cerebral infarction without residual deficits: Secondary | ICD-10-CM | POA: Diagnosis not present

## 2023-08-27 DIAGNOSIS — G4733 Obstructive sleep apnea (adult) (pediatric): Secondary | ICD-10-CM | POA: Diagnosis not present

## 2023-08-27 MED ORDER — EMPAGLIFLOZIN 25 MG PO TABS: 25.0000 mg | ORAL_TABLET | Freq: Every day | ORAL | 1 refills | Status: DC

## 2023-08-27 MED ORDER — ATORVASTATIN CALCIUM 40 MG PO TABS: 40.0000 mg | ORAL_TABLET | Freq: Every day | ORAL | 1 refills | Status: DC

## 2023-08-27 MED ORDER — TIRZEPATIDE 5 MG/0.5ML ~~LOC~~ SOAJ: 5.0000 mg | SUBCUTANEOUS | 2 refills | Status: DC

## 2023-08-27 NOTE — Assessment & Plan Note (Signed)
 Reports excessive daytime sleepiness and issues with current CPAP machine. CPAP machine is auto-adjusting but may need a new mask or adjustment. Discussed the possibility of a mask fitting and equipment adjustment to improve effectiveness. - Refer to [facility] for CPAP mask fitting and equipment adjustment

## 2023-08-27 NOTE — Assessment & Plan Note (Signed)
 Previously on doxycycline 50 mg daily for rosacea. Discontinued during hospital stay  but advised to resume by dermatologist. - Resume doxycycline 50 mg daily

## 2023-08-27 NOTE — Assessment & Plan Note (Signed)
 Diastolic Dysfunction Echocardiogram revealed normal LV ejection fraction and mild grade one diastolic dysfunction. Advised to mention this condition if hospitalized to avoid rapid IV fluid administration. Explained that this condition is common in older adults and typically not a major concern but requires careful fluid management. - Document diastolic dysfunction in chart - Advise to mention diastolic dysfunction if hospitalized

## 2023-08-27 NOTE — Assessment & Plan Note (Signed)
 Current medications include Jardiance  25 mg daily, Actos  30 mg daily, and Mounjaro  2.5 mg weekly. Recently switched from Rybelsus  to Mounjaro . Plan to increase Mounjaro  dose to 5 mg after four weeks if tolerated. A1c was 7.5% last month. Discussed potential GI side effects of Mounjaro  and the need to monitor and adjust dosage if necessary. Explained that smaller, more frequent meals may help mitigate GI side effects. Discussed the possibility of discontinuing Actos  if Mounjaro  is effective. - Continue Jardiance  25 mg daily - Continue Actos  30 mg daily - Continue Mounjaro  2.5 mg weekly, increase to 5 mg after four weeks if tolerated - Recheck A1c in mid-March - Monitor for GI side effects and adjust Mounjaro  dose if necessary

## 2023-08-27 NOTE — Assessment & Plan Note (Signed)
 Blood pressure well-controlled with losartan 100 mg daily and metoprolol XL 25 mg daily. - Continue losartan 100 mg daily - Continue metoprolol XL 25 mg daily

## 2023-08-27 NOTE — Assessment & Plan Note (Signed)
 Presented to the ER on 08/08/2023 with difficulty ambulating, intermittent ataxia, and mild dizziness. Head CT and brain MRI confirmed an acute infarct in the right cerebellum in the PICA territory, with a more proximal PICA stenosis. Neurology was consulted. Discharged on 08/09/2023. Currently experiencing lightheadedness but no severe spells. Discussed that new symptoms such as vision changes, speech changes, weakness, or numbness would necessitate a repeat CT scan. Continued dual antiplatelet therapy for 90 days, then switch to monotherapy with aspirin . LDL at goal, but switched to high-intensity statin for better prevention. Encouraged good blood pressure follow-up. Explained that the stroke will always be visible on scans, but symptoms guide treatment decisions. - Continue dual antiplatelet therapy (aspirin  81 mg daily, Plavix  75 mg daily) for 90 days - Switch to aspirin  monotherapy after 90 days - Continue atorvastatin  40 mg daily - Encourage good blood pressure follow-up

## 2023-08-27 NOTE — Progress Notes (Signed)
 Established patient visit   Patient: Ivan Kennedy   DOB: 05/16/1946   78 y.o. Male  MRN: [id number] Visit Date: 08/27/2023  Today's healthcare provider: Jon Eva, MD   Chief Complaint  Patient presents with   Hospitalization Follow-up    Patient was seen at [facility] on 08/08/23-08/09/23 for CVA. Patient reports feeling a little light headed but no bad spells and not much energy.   Medication Consultation    Should he be taking his doxy for rosea. Reports was taken off in the hospital but was advised by Dr.Graham to start back taking it. Also wants to be sure if he should stop Plavix  after 90 days. Only given 30 day refill of Lipitor. Also needing refill on Jardiance  as he does not have any refills remaining and Mounjaro  was only given 4 pens so wants to know what's next once those are finished.    Subjective    HPI HPI     Hospitalization Follow-up    Additional comments: Patient was seen at [facility] on 08/08/23-08/09/23 for CVA. Patient reports feeling a little light headed but no bad spells and not much energy.        Medication Consultation    Additional comments: Should he be taking his doxy for rosea. Reports was taken off in the hospital but was advised by Dr.Graham to start back taking it. Also wants to be sure if he should stop Plavix  after 90 days. Only given 30 day refill of Lipitor. Also needing refill on Jardiance  as he does not have any refills remaining and Mounjaro  was only given 4 pens so wants to know what's next once those are finished.       Last edited by Lilian Fitzpatrick, CMA on 08/27/2023 11:22 AM.       Discussed the use of AI scribe software for clinical note transcription with the patient, who gave verbal consent to proceed.  History of Present Illness   Ivan Kennedy, a 78 year old male with a history of type two diabetes, hyperlipidemia, and hypertension, presents for a follow-up after a recent hospitalization. He was admitted due to difficulty with  ambulation, intermittent ataxia, and mild dizziness. A head CT scan during his hospital stay revealed an acute or subacute right cerebellum infarct in the PICA territory. He was started on dual antiplatelet therapy and a high-intensity statin.  Currently, Ivan Kennedy reports persistent lightheadedness, although he has not experienced any severe spells. He describes the sensation as not being where he wants to be, but it is not severe enough to cause him to fall. He also reports issues with his CPAP machine, which he uses for sleep apnea. He feels that the machine is not working as effectively as it used to, leading to dry mouth and disrupted sleep.         Medications: Outpatient Medications Prior to Visit  Medication Sig   Accu-Chek Softclix Lancets lancets See admin instructions.   ARTIFICIAL TEARS 1 % ophthalmic solution    aspirin  EC 81 MG tablet Take 1 tablet (81 mg total) by mouth daily. Swallow whole.   Azelaic Acid 15 % gel Apply topically every morning.   Blood Glucose Monitoring Suppl (ONE TOUCH ULTRA 2) w/Device KIT To check blood sugar once daily   clopidogrel  (PLAVIX ) 75 MG tablet Take 1 tablet (75 mg total) by mouth daily.   doxycycline  (ADOXA) 50 MG tablet Take 50 mg by mouth daily.   glucose blood (ONETOUCH ULTRA) test strip CHECK BLOOD SUGAR ONCE  DAILY   Lancets (ACCU-CHEK SOFT TOUCH) lancets To check blood glucose daily   loratadine (CLARITIN) 10 MG tablet Take 10 mg by mouth daily.   losartan  (COZAAR ) 100 MG tablet Take 1 tablet (100 mg total) by mouth daily.   metoprolol  succinate (TOPROL -XL) 25 MG 24 hr tablet Take 1 tablet (25 mg total) by mouth daily.   metroNIDAZOLE (METROGEL) 1 % gel Apply 1 application topically every morning.   neomycin-polymyxin b-dexamethasone (MAXITROL) 3.5-10000-0.1 OINT Place 1 Application into both eyes at bedtime.   omeprazole  (PRILOSEC) 40 MG capsule TAKE 1 CAPSULE BY MOUTH EVERY DAY   ONETOUCH ULTRA test strip CHECK BLOOD SUGAR ONCE DAILY    pioglitazone  (ACTOS ) 30 MG tablet Take 1 tablet (30 mg total) by mouth daily.   POLYTRIM ophthalmic solution 1 drop 4 (four) times daily.   tamsulosin  (FLOMAX ) 0.4 MG CAPS capsule Take 2 capsules (0.8 mg total) by mouth daily.   tirzepatide  (MOUNJARO ) 2.5 MG/0.5ML Pen Inject 2.5 mg into the skin once a week. Take in place of Rybelsus    tretinoin  (RETIN-A ) 0.05 % cream Apply 1 application  topically at bedtime.   vitamin B-12 (CYANOCOBALAMIN ) 1000 MCG tablet Take 1,000 mcg by mouth daily.   XDEMVY 0.25 % SOLN Apply 1 drop to eye 4 (four) times daily.   [DISCONTINUED] atorvastatin  (LIPITOR) 40 MG tablet Take 1 tablet (40 mg total) by mouth daily.   [DISCONTINUED] empagliflozin  (JARDIANCE ) 25 MG TABS tablet Take 1 tablet (25 mg total) by mouth daily before breakfast.   No facility-administered medications prior to visit.    Review of Systems     Objective    BP 127/83 (Cuff Size: Normal)   Pulse 79   Ht 6' 1 (1.854 m)   Wt 223 lb 12.8 oz (101.5 kg)   SpO2 100%   BMI 29.53 kg/m    Physical Exam Vitals reviewed.  Constitutional:      General: He is not in acute distress.    Appearance: Normal appearance. He is not diaphoretic.  HENT:     Head: Normocephalic and atraumatic.  Eyes:     General: No scleral icterus.    Conjunctiva/sclera: Conjunctivae normal.  Cardiovascular:     Rate and Rhythm: Normal rate and regular rhythm.     Heart sounds: Normal heart sounds. No murmur heard. Pulmonary:     Effort: Pulmonary effort is normal. No respiratory distress.     Breath sounds: Normal breath sounds. No wheezing or rhonchi.  Musculoskeletal:     Cervical back: Neck supple.     Right lower leg: No edema.     Left lower leg: No edema.  Lymphadenopathy:     Cervical: No cervical adenopathy.  Skin:    General: Skin is warm and dry.     Findings: No rash.  Neurological:     Mental Status: He is alert and oriented to person, place, and time. Mental status is at baseline.   Psychiatric:        Mood and Affect: Mood normal.        Behavior: Behavior normal.      No results found for any visits on 08/27/23.  Assessment & Plan     Problem List Items Addressed This Visit       Cardiovascular and Mediastinum   Hypertension associated with diabetes (HCC)   Blood pressure well-controlled with losartan  100 mg daily and metoprolol  XL 25 mg daily. - Continue losartan  100 mg daily - Continue metoprolol  XL 25 mg  daily      Relevant Medications   tirzepatide  (MOUNJARO ) 5 MG/0.5ML Pen   atorvastatin  (LIPITOR) 40 MG tablet   empagliflozin  (JARDIANCE ) 25 MG TABS tablet   Chronic heart failure with preserved ejection fraction (HCC)   Diastolic Dysfunction Echocardiogram revealed normal LV ejection fraction and mild grade one diastolic dysfunction. Advised to mention this condition if hospitalized to avoid rapid IV fluid administration. Explained that this condition is common in older adults and typically not a major concern but requires careful fluid management. - Document diastolic dysfunction in chart - Advise to mention diastolic dysfunction if hospitalized       Relevant Medications   atorvastatin  (LIPITOR) 40 MG tablet     Respiratory   OSA on CPAP   Reports excessive daytime sleepiness and issues with current CPAP machine. CPAP machine is auto-adjusting but may need a new mask or adjustment. Discussed the possibility of a mask fitting and equipment adjustment to improve effectiveness. - Refer to [facility] for CPAP mask fitting and equipment adjustment        Endocrine   Type 2 diabetes mellitus without complications (HCC)   Current medications include Jardiance  25 mg daily, Actos  30 mg daily, and Mounjaro  2.5 mg weekly. Recently switched from Rybelsus  to Mounjaro . Plan to increase Mounjaro  dose to 5 mg after four weeks if tolerated. A1c was 7.5% last month. Discussed potential GI side effects of Mounjaro  and the need to monitor and adjust dosage if  necessary. Explained that smaller, more frequent meals may help mitigate GI side effects. Discussed the possibility of discontinuing Actos  if Mounjaro  is effective. - Continue Jardiance  25 mg daily - Continue Actos  30 mg daily - Continue Mounjaro  2.5 mg weekly, increase to 5 mg after four weeks if tolerated - Recheck A1c in mid-March - Monitor for GI side effects and adjust Mounjaro  dose if necessary      Relevant Medications   tirzepatide  (MOUNJARO ) 5 MG/0.5ML Pen   atorvastatin  (LIPITOR) 40 MG tablet   empagliflozin  (JARDIANCE ) 25 MG TABS tablet   Hyperlipidemia associated with type 2 diabetes mellitus (HCC)   LDL at goal, but switched from simvastatin  to high-intensity atorvastatin  for better stroke prevention. - Continue atorvastatin  40 mg daily      Relevant Medications   tirzepatide  (MOUNJARO ) 5 MG/0.5ML Pen   atorvastatin  (LIPITOR) 40 MG tablet   empagliflozin  (JARDIANCE ) 25 MG TABS tablet     Other   Rosacea   Previously on doxycycline  50 mg daily for rosacea. Discontinued during hospital stay  but advised to resume by dermatologist. - Resume doxycycline  50 mg daily      History of ischemic right PCA stroke - Primary   Presented to the ER on 08/08/2023 with difficulty ambulating, intermittent ataxia, and mild dizziness. Head CT and brain MRI confirmed an acute infarct in the right cerebellum in the PICA territory, with a more proximal PICA stenosis. Neurology was consulted. Discharged on 08/09/2023. Currently experiencing lightheadedness but no severe spells. Discussed that new symptoms such as vision changes, speech changes, weakness, or numbness would necessitate a repeat CT scan. Continued dual antiplatelet therapy for 90 days, then switch to monotherapy with aspirin . LDL at goal, but switched to high-intensity statin for better prevention. Encouraged good blood pressure follow-up. Explained that the stroke will always be visible on scans, but symptoms guide treatment  decisions. - Continue dual antiplatelet therapy (aspirin  81 mg daily, Plavix  75 mg daily) for 90 days - Switch to aspirin  monotherapy after 90 days - Continue atorvastatin   40 mg daily - Encourage good blood pressure follow-up           General Health Maintenance Discussed the importance of controlling blood pressure, cholesterol, and diabetes to prevent another stroke. Encouraged smaller, more frequent meals to avoid GI issues with Mounjaro . - Encourage good blood pressure control - Continue high-intensity statin therapy - Monitor and control blood glucose levels - Encourage smaller, more frequent meals  Follow-up - Schedule follow-up visit in mid-March to recheck A1c - Schedule regular follow-up visit in June.        Return in about 2 months (around 10/25/2023) for chronic disease f/u after 3/15.       Jon Eva, MD  [facility] [phone] (phone) [phone] (fax)  [facility]

## 2023-08-27 NOTE — Assessment & Plan Note (Signed)
 LDL at goal, but switched from simvastatin to high-intensity atorvastatin for better stroke prevention. - Continue atorvastatin 40 mg daily

## 2023-08-28 ENCOUNTER — Telehealth: Payer: Self-pay

## 2023-08-28 NOTE — Progress Notes (Signed)
 Care Guide Pharmacy Note  08/28/2023 Name: Ivan Kennedy MRN: [id number] DOB: [birth year]  Referred By: Myrla Jon HERO, MD Reason for referral: Care Coordination (Outreach to schedule with Pharm d )   Ivan Kennedy is a 78 y.o. year old male who is a primary care patient of Myrla Jon HERO, MD.  Ivan Kennedy was referred to the pharmacist for assistance related to: DMII  Successful contact was made with the patient to discuss pharmacy services including being ready for the pharmacist to call at least 5 minutes before the scheduled appointment time and to have medication bottles and any blood pressure readings ready for review. The patient agreed to meet with the pharmacist via telephone visit on (date/time).09/05/2023  Jeoffrey Buffalo , RMA     [place]  [facility] Guide  Direct Dial: [phone]  Website: [email or web address]

## 2023-09-05 ENCOUNTER — Other Ambulatory Visit: Payer: Self-pay | Admitting: Pharmacist

## 2023-09-05 NOTE — Progress Notes (Signed)
   09/05/2023  Patient ID: Ivan Kennedy, male   DOB: 1946/01/18, 78 y.o.   MRN: [id number]  Called and spoke with the patient today. Referral sent to get [facility] covered by insurance due to "failure" of Rybelsus with A1c going from 7.1% to 7.5% with use.   Patient reports having received Mounjaro 2.5mg /mL and plans to get the 5mg  dose next. Was advised the cost was $117 for a 3 month supply. As for the Jardiance, he had concerns with this as well. Advised his monthly copay would be $47 as well.   Asked if this was affordable and offered some PAP options, but would need paperwork completed to submit to these programs. Reports things are going well currently and he can afford them. Will contact PCP office if he changes his mind or has difficulty with cost in the future.  Of note, patient did have a stroke on 08/08/23 and was started on DAPT. Currently taking Omeprazole and would recommend Pantoprazole instead due to this known interactions with Omeprazole. Will discuss with Dr. Beryle Flock.    Marlowe Aschoff, PharmD [facility] Phone Number: [phone]

## 2023-09-06 NOTE — Progress Notes (Signed)
Yes. I approve the change. Thanks!

## 2023-09-07 ENCOUNTER — Telehealth: Payer: Self-pay | Admitting: Pharmacist

## 2023-09-07 DIAGNOSIS — K219 Gastro-esophageal reflux disease without esophagitis: Secondary | ICD-10-CM

## 2023-09-07 MED ORDER — PANTOPRAZOLE SODIUM 20 MG PO TBEC: 20.0000 mg | DELAYED_RELEASE_TABLET | Freq: Every day | ORAL | 1 refills | Status: DC

## 2023-09-07 NOTE — Progress Notes (Signed)
   09/07/2023  Patient ID: Ivan Kennedy, male   DOB: January 23, 1946, 78 y.o.   MRN: [id number]  Called patient and advised the switch of Omeprazole to Pantoprazole was approved by Dr. Beryle Flock. Patient confirms that he is to stop the Omeprazole once starting Pantoprazole. Advised to reach out if it is not treating his GERD as well or having any cost concerns.  Marlowe Aschoff, PharmD [facility] Phone Number: [phone]

## 2023-09-18 DIAGNOSIS — C44622 Squamous cell carcinoma of skin of right upper limb, including shoulder: Secondary | ICD-10-CM | POA: Diagnosis not present

## 2023-10-02 DIAGNOSIS — Z961 Presence of intraocular lens: Secondary | ICD-10-CM | POA: Diagnosis not present

## 2023-10-02 DIAGNOSIS — E119 Type 2 diabetes mellitus without complications: Secondary | ICD-10-CM | POA: Diagnosis not present

## 2023-10-02 DIAGNOSIS — H26491 Other secondary cataract, right eye: Secondary | ICD-10-CM | POA: Diagnosis not present

## 2023-10-02 DIAGNOSIS — M3501 Sicca syndrome with keratoconjunctivitis: Secondary | ICD-10-CM | POA: Diagnosis not present

## 2023-10-02 LAB — HM DIABETES EYE EXAM

## 2023-10-16 DIAGNOSIS — H26492 Other secondary cataract, left eye: Secondary | ICD-10-CM | POA: Diagnosis not present

## 2023-10-19 ENCOUNTER — Telehealth: Payer: Self-pay | Admitting: Family Medicine

## 2023-10-19 DIAGNOSIS — E119 Type 2 diabetes mellitus without complications: Secondary | ICD-10-CM

## 2023-10-19 MED ORDER — ONETOUCH ULTRA VI STRP: ORAL_STRIP | 3 refills | Status: DC

## 2023-10-19 NOTE — Telephone Encounter (Signed)
 [facility] pharmacy faxed refill request for the following medications:   glucose blood (ONETOUCH ULTRA) test strip     Please advise

## 2023-10-22 ENCOUNTER — Other Ambulatory Visit: Payer: Self-pay | Admitting: Family Medicine

## 2023-10-22 DIAGNOSIS — E119 Type 2 diabetes mellitus without complications: Secondary | ICD-10-CM

## 2023-10-22 MED ORDER — ONETOUCH ULTRA VI STRP: ORAL_STRIP | 3 refills | Status: AC

## 2023-10-22 NOTE — Telephone Encounter (Signed)
 Copied from CRM [id number]. Topic: Clinical - Medication Refill >> Oct 22, 2023  9:18 AM Bobbye Morton wrote: Most Recent Primary Care Visit:  Provider: Erasmo Downer  Department: [facility]  Visit Type: HOSPITAL FU  Date: 08/27/2023  Medication: glucose blood (ONETOUCH ULTRA) test strip  Has the patient contacted their pharmacy? Yes (Agent: If no, request that the patient contact the pharmacy for the refill. If patient does not wish to contact the pharmacy document the reason why and proceed with request.) (Agent: If yes, when and what did the pharmacy advise?)  Is this the correct pharmacy for this prescription? Yes If no, delete pharmacy and type the correct one.  This is the patient's preferred pharmacy:  [id number] - [place], [place] - [address] AT [facility] [address] [place] [address] Phone: [phone] Fax: [phone]  Has the prescription been filled recently? No  Is the patient out of the medication? Yes, pt has 2 strips left  Has the patient been seen for an appointment in the last year OR does the patient have an upcoming appointment? Yes  Can we respond through MyChart? Yes  Agent: Please be advised that Rx refills may take up to 3 business days. We ask that you follow-up with your pharmacy.

## 2023-11-09 ENCOUNTER — Ambulatory Visit: Payer: Self-pay | Admitting: Family Medicine

## 2023-11-15 ENCOUNTER — Encounter: Payer: Self-pay | Admitting: Family Medicine

## 2023-11-15 ENCOUNTER — Ambulatory Visit: Admitting: Family Medicine

## 2023-11-15 VITALS — BP 138/85 | HR 70 | Temp 97.6°F | Ht 73.0 in | Wt 227.7 lb

## 2023-11-15 DIAGNOSIS — I152 Hypertension secondary to endocrine disorders: Secondary | ICD-10-CM

## 2023-11-15 DIAGNOSIS — Z8673 Personal history of transient ischemic attack (TIA), and cerebral infarction without residual deficits: Secondary | ICD-10-CM | POA: Diagnosis not present

## 2023-11-15 DIAGNOSIS — E1159 Type 2 diabetes mellitus with other circulatory complications: Secondary | ICD-10-CM | POA: Diagnosis not present

## 2023-11-15 DIAGNOSIS — E785 Hyperlipidemia, unspecified: Secondary | ICD-10-CM

## 2023-11-15 DIAGNOSIS — E119 Type 2 diabetes mellitus without complications: Secondary | ICD-10-CM

## 2023-11-15 DIAGNOSIS — E1169 Type 2 diabetes mellitus with other specified complication: Secondary | ICD-10-CM

## 2023-11-15 DIAGNOSIS — Z7984 Long term (current) use of oral hypoglycemic drugs: Secondary | ICD-10-CM | POA: Diagnosis not present

## 2023-11-15 LAB — POCT GLYCOSYLATED HEMOGLOBIN (HGB A1C): Hemoglobin A1C: 6.6 % — AB (ref 4.0–5.6)

## 2023-11-15 MED ORDER — TIRZEPATIDE 5 MG/0.5ML ~~LOC~~ SOAJ: 5.0000 mg | SUBCUTANEOUS | 2 refills | Status: DC

## 2023-11-15 NOTE — Progress Notes (Signed)
 po

## 2023-11-15 NOTE — Assessment & Plan Note (Signed)
 No residual deficits. Completed 90-day course of Plavix, which has been discontinued. No need for further Plavix therapy. Balance and physical function are normal. No physical therapy was required post-hospitalization. - Discontinue Plavix

## 2023-11-15 NOTE — Assessment & Plan Note (Signed)
 Blood pressure well-controlled with losartan 100 mg daily and metoprolol XL 25 mg daily. - Continue losartan 100 mg daily - Continue metoprolol XL 25 mg daily

## 2023-11-15 NOTE — Assessment & Plan Note (Signed)
 Hyperlipidemia managed with atorvastatin. Switched from simvastatin to atorvastatin for higher intensity therapy aimed at stroke prevention. He tolerates atorvastatin well. - Order lipid panel to assess cholesterol levels - Continue atorvastatin

## 2023-11-15 NOTE — Progress Notes (Signed)
 Established patient visit   Patient: Ivan Kennedy   DOB: [birth year]   78 y.o. Male  MRN: [id number] Visit Date: 11/15/2023  Today's healthcare provider: Shirlee Latch, MD   Chief Complaint  Patient presents with   Medical Management of Chronic Issues   Follow-up    Stroke follow up,  Pt wants to dicuss plabix prescription  No other concerns   Subjective    HPI HPI     Follow-up    Additional comments: Stroke follow up,  Pt wants to dicuss plabix prescription  No other concerns      Last edited by Allayne Stack on 11/15/2023  8:46 AM.       Discussed the use of AI scribe software for clinical note transcription with the patient, who gave verbal consent to proceed.  History of Present Illness   The patient, with a history of diabetes, stroke, and high cholesterol, presents for a routine follow-up. He reports no problems with his current medication regimen, which includes a recent increase in the dose of his diabetes medication. His most recent A1c was 6.6, down from 7.5 at his previous visit, indicating improved blood sugar control. He has one more month's supply of his current diabetes medication and takes it every Sunday morning. He also reports that he has stopped taking Plavix, as instructed, after a 90-day course following his stroke. He reports no residual deficits or problems following his stroke and feels his balance is okay. He also reports some ongoing issues with his eyes, but notes that recent laser treatment has helped and he is due for a follow-up in May.         Medications: Outpatient Medications Prior to Visit  Medication Sig   Accu-Chek Softclix Lancets lancets See admin instructions.   ARTIFICIAL TEARS 1 % ophthalmic solution    aspirin EC 81 MG tablet Take 1 tablet (81 mg total) by mouth daily. Swallow whole.   atorvastatin (LIPITOR) 40 MG tablet Take 1 tablet (40 mg total) by mouth daily.   Azelaic Acid 15 % gel Apply topically every  morning.   Blood Glucose Monitoring Suppl (ONE TOUCH ULTRA 2) w/Device KIT To check blood sugar once daily   doxycycline (ADOXA) 50 MG tablet Take 50 mg by mouth daily.   empagliflozin (JARDIANCE) 25 MG TABS tablet Take 1 tablet (25 mg total) by mouth daily before breakfast.   glucose blood (ONETOUCH ULTRA) test strip CHECK BLOOD SUGAR ONCE DAILY   Lancets (ACCU-CHEK SOFT TOUCH) lancets To check blood glucose daily   loratadine (CLARITIN) 10 MG tablet Take 10 mg by mouth daily.   losartan (COZAAR) 100 MG tablet Take 1 tablet (100 mg total) by mouth daily.   metoprolol succinate (TOPROL-XL) 25 MG 24 hr tablet Take 1 tablet (25 mg total) by mouth daily.   metroNIDAZOLE (METROGEL) 1 % gel Apply 1 application topically every morning.   ONETOUCH ULTRA test strip CHECK BLOOD SUGAR ONCE DAILY   pantoprazole (PROTONIX) 20 MG tablet Take 1 tablet (20 mg total) by mouth daily.   pioglitazone (ACTOS) 30 MG tablet Take 1 tablet (30 mg total) by mouth daily.   POLYTRIM ophthalmic solution 1 drop 4 (four) times daily.   tamsulosin (FLOMAX) 0.4 MG CAPS capsule Take 2 capsules (0.8 mg total) by mouth daily.   tretinoin (RETIN-A) 0.05 % cream Apply 1 application  topically at bedtime.   vitamin B-12 (CYANOCOBALAMIN) 1000 MCG tablet Take 1,000 mcg by mouth daily.   [  DISCONTINUED] tirzepatide [facility]) 5 MG/0.5ML Pen Inject 5 mg into the skin once a week.   neomycin-polymyxin b-dexamethasone (MAXITROL) 3.5-10000-0.1 OINT Place 1 Application into both eyes at bedtime. (Patient not taking: Reported on 11/15/2023)   XDEMVY 0.25 % SOLN Apply 1 drop to eye 4 (four) times daily. (Patient not taking: Reported on 11/15/2023)   [DISCONTINUED] tirzepatide [facility]) 2.5 MG/0.5ML Pen Inject 2.5 mg into the skin once a week. Take in place of [place] (Patient not taking: Reported on 11/15/2023)   No facility-administered medications prior to visit.    Review of Systems     Objective    BP 138/85   Pulse 70   Temp 97.6  F (36.4 C)   Ht 6\' 1"  (1.854 m)   Wt 227 lb 11.2 oz (103.3 kg)   SpO2 100%   BMI 30.04 kg/m    Physical Exam Vitals reviewed.  Constitutional:      General: He is not in acute distress.    Appearance: Normal appearance. He is not diaphoretic.  HENT:     Head: Normocephalic and atraumatic.  Eyes:     General: No scleral icterus.    Conjunctiva/sclera: Conjunctivae normal.  Cardiovascular:     Rate and Rhythm: Normal rate and regular rhythm.     Heart sounds: Normal heart sounds. No murmur heard. Pulmonary:     Effort: Pulmonary effort is normal. No respiratory distress.     Breath sounds: Normal breath sounds. No wheezing or rhonchi.  Musculoskeletal:     Cervical back: Neck supple.     Right lower leg: No edema.     Left lower leg: No edema.  Lymphadenopathy:     Cervical: No cervical adenopathy.  Skin:    General: Skin is warm and dry.  Neurological:     Mental Status: He is alert. Mental status is at baseline.  Psychiatric:        Mood and Affect: Mood normal.        Behavior: Behavior normal.      No results found for any visits on 11/15/23.  Assessment & Plan     Problem List Items Addressed This Visit       Cardiovascular and Mediastinum   Hypertension associated with diabetes (HCC)   Blood pressure well-controlled with losartan 100 mg daily and metoprolol XL 25 mg daily. - Continue losartan 100 mg daily - Continue metoprolol XL 25 mg daily      Relevant Medications   tirzepatide (MOUNJARO) 5 MG/0.5ML Pen   Other Relevant Orders   Comprehensive metabolic panel with GFR   Lipid panel     Endocrine   Type 2 diabetes mellitus without complications (HCC) - Primary   Type 2 Diabetes Mellitus with improved glycemic control. Current A1c is 6.6, down from 7.5. He is tolerating the current medication regimen well without side effects. Mounjaro and Jardiance are preferred over Actos due to better efficacy and fewer side effects. Plan to eventually  discontinue Actos once glycemic control is stable. Mounjaro dose can be increased if needed, with available doses up to 15 mg. - Continue Mounjaro 5 mg weekly - Continue Jardiance - Plan to reduce and eventually discontinue Actos once glycemic control is stable - Schedule follow-up in 3 months to reassess A1c and medication regimen      Relevant Medications   tirzepatide (MOUNJARO) 5 MG/0.5ML Pen   Other Relevant Orders   POCT HgB A1C   Comprehensive metabolic panel with GFR   Lipid panel  Hyperlipidemia associated with type 2 diabetes mellitus (HCC)   Hyperlipidemia managed with atorvastatin. Switched from simvastatin to atorvastatin for higher intensity therapy aimed at stroke prevention. He tolerates atorvastatin well. - Order lipid panel to assess cholesterol levels - Continue atorvastatin      Relevant Medications   tirzepatide (MOUNJARO) 5 MG/0.5ML Pen   Other Relevant Orders   Comprehensive metabolic panel with GFR   Lipid panel     Other   History of ischemic right PCA stroke   No residual deficits. Completed 90-day course of Plavix, which has been discontinued. No need for further Plavix therapy. Balance and physical function are normal. No physical therapy was required post-hospitalization. - Discontinue Plavix           General Health Maintenance He is up to date on general health maintenance. Recent eye exam in February and foot exam completed. Urine test done in December. Reports improvement in vision following recent laser treatment. - Recheck cholesterol, kidney, and liver function tests today - Schedule next eye exam follow-up in May       Return in about 3 months (around 02/14/2024) for chronic disease f/u.       Shirlee Latch, MD  [facility] [phone] (phone) [phone] (fax)  [facility]

## 2023-11-15 NOTE — Assessment & Plan Note (Signed)
 Type 2 Diabetes Mellitus with improved glycemic control. Current A1c is 6.6, down from 7.5. He is tolerating the current medication regimen well without side effects. Mounjaro and Jardiance are preferred over Actos due to better efficacy and fewer side effects. Plan to eventually discontinue Actos once glycemic control is stable. Mounjaro dose can be increased if needed, with available doses up to 15 mg. - Continue Mounjaro 5 mg weekly - Continue Jardiance - Plan to reduce and eventually discontinue Actos once glycemic control is stable - Schedule follow-up in 3 months to reassess A1c and medication regimen

## 2023-11-16 ENCOUNTER — Encounter: Payer: Self-pay | Admitting: Family Medicine

## 2023-11-16 LAB — COMPREHENSIVE METABOLIC PANEL WITH GFR
ALT: 20 IU/L (ref 0–44)
AST: 23 IU/L (ref 0–40)
Albumin: 4.3 g/dL (ref 3.8–4.8)
Alkaline Phosphatase: 61 IU/L (ref 44–121)
BUN/Creatinine Ratio: 15 (ref 10–24)
BUN: 15 mg/dL (ref 8–27)
Bilirubin Total: 1 mg/dL (ref 0.0–1.2)
CO2: 22 mmol/L (ref 20–29)
Calcium: 9.4 mg/dL (ref 8.6–10.2)
Chloride: 107 mmol/L — ABNORMAL HIGH (ref 96–106)
Creatinine, Ser: 1.02 mg/dL (ref 0.76–1.27)
Globulin, Total: 1.5 g/dL (ref 1.5–4.5)
Glucose: 117 mg/dL — ABNORMAL HIGH (ref 70–99)
Potassium: 3.9 mmol/L (ref 3.5–5.2)
Sodium: 142 mmol/L (ref 134–144)
Total Protein: 5.8 g/dL — ABNORMAL LOW (ref 6.0–8.5)
eGFR: 77 mL/min/{1.73_m2} (ref >59)

## 2023-11-16 LAB — LIPID PANEL
Chol/HDL Ratio: 1.9 ratio (ref 0.0–5.0)
Cholesterol, Total: 95 mg/dL — ABNORMAL LOW (ref 100–199)
HDL: 51 mg/dL (ref >39)
LDL Chol Calc (NIH): 31 mg/dL (ref 0–99)
Triglycerides: 51 mg/dL (ref 0–149)
VLDL Cholesterol Cal: 13 mg/dL (ref 5–40)

## 2023-12-16 ENCOUNTER — Other Ambulatory Visit: Payer: Self-pay | Admitting: Family Medicine

## 2023-12-16 DIAGNOSIS — K219 Gastro-esophageal reflux disease without esophagitis: Secondary | ICD-10-CM

## 2023-12-24 ENCOUNTER — Other Ambulatory Visit: Payer: Self-pay | Admitting: Family Medicine

## 2023-12-24 NOTE — Telephone Encounter (Signed)
 Copied from CRM [id number]. Topic: Clinical - Medication Refill >> Dec 24, 2023  9:00 AM Jalayah J wrote: Medication: tamsulosin  (FLOMAX ) 0.4 MG CAPS capsule  Has the patient contacted their pharmacy? Yes (Agent: If no, request that the patient contact the pharmacy for the refill. If patient does not wish to contact the pharmacy document the reason why and proceed with request.) (Agent: If yes, when and what did the pharmacy advise?)  This is the patient's preferred pharmacy:  [id number] [place], [place] - [address] [place] [address] Phone: [phone] Fax: [phone]    Is this the correct pharmacy for this prescription? Yes If no, delete pharmacy and type the correct one.   Has the prescription been filled recently? Yes  Is the patient out of the medication? No  Has the patient been seen for an appointment in the last year OR does the patient have an upcoming appointment? Yes  Can we respond through MyChart? Yes  Agent: Please be advised that Rx refills may take up to 3 business days. We ask that you follow-up with your pharmacy.

## 2023-12-25 NOTE — Telephone Encounter (Signed)
 Requested Prescriptions  Refused Prescriptions Disp Refills   tamsulosin  (FLOMAX ) 0.4 MG CAPS capsule 90 capsule 1    Sig: Take 1 capsule (0.4 mg total) by mouth daily.     Urology: Alpha-Adrenergic Blocker Passed - 12/25/2023  5:30 PM      Passed - PSA in normal range and within 360 days    PSA  Date Value Ref Range Status  04/26/2017 2.8 < OR = 4.0 ng/mL Final    Comment:    The total PSA value from this assay system is  standardized against the WHO standard. The test  result will be approximately 20% lower when compared  to the equimolar-standardized total PSA (Beckman  Coulter). Comparison of serial PSA results should be  interpreted with this fact in mind. . This test was performed using the Siemens  chemiluminescent method. Values obtained from  different assay methods cannot be used interchangeably. PSA levels, regardless of value, should not be interpreted as absolute evidence of the presence or absence of disease.    Prostate Specific Ag, Serum  Date Value Ref Range Status  02/22/2023 3.5 0.0 - 4.0 ng/mL Final    Comment:    Roche ECLIA methodology. According to the American Urological Association, Serum PSA should decrease and remain at undetectable levels after radical prostatectomy. The AUA defines biochemical recurrence as an initial PSA value 0.2 ng/mL or greater followed by a subsequent confirmatory PSA value 0.2 ng/mL or greater. Values obtained with different assay methods or kits cannot be used interchangeably. Results cannot be interpreted as absolute evidence of the presence or absence of malignant disease.          Passed - Last BP in normal range    BP Readings from Last 1 Encounters:  11/15/23 138/85         Passed - Valid encounter within last 12 months    Recent Outpatient Visits           1 month ago Type 2 diabetes mellitus without complication, without long-term current use of insulin  [facility]   [facility]  [place], Stan Eans, MD

## 2023-12-26 ENCOUNTER — Other Ambulatory Visit: Payer: Self-pay

## 2023-12-26 MED ORDER — TAMSULOSIN HCL 0.4 MG PO CAPS: 0.8000 mg | ORAL_CAPSULE | Freq: Every day | ORAL | 1 refills | Status: DC

## 2024-01-15 DIAGNOSIS — Z961 Presence of intraocular lens: Secondary | ICD-10-CM | POA: Diagnosis not present

## 2024-01-24 DIAGNOSIS — G4733 Obstructive sleep apnea (adult) (pediatric): Secondary | ICD-10-CM | POA: Diagnosis not present

## 2024-01-31 ENCOUNTER — Ambulatory Visit: Payer: Self-pay | Admitting: Family Medicine

## 2024-02-10 ENCOUNTER — Other Ambulatory Visit: Payer: Self-pay | Admitting: Family Medicine

## 2024-02-10 MED ORDER — TIRZEPATIDE 5 MG/0.5ML ~~LOC~~ SOAJ: 5.0000 mg | SUBCUTANEOUS | 0 refills | Status: DC

## 2024-02-10 NOTE — Telephone Encounter (Signed)
 Received call from Access RN regarding a call from patient.  Pt is out of town and forgot his prescription of Mounjaro  so [facility] [place] [facility] doses.   5mg  mounjaro  sent to pharmacy, 2mL with 0 refills

## 2024-02-11 ENCOUNTER — Telehealth: Payer: Self-pay

## 2024-02-11 NOTE — Telephone Encounter (Signed)
 Ok to give approval to pharmacy to fill early. If he is unable to fill early due to cost/insurance, we can assure him that medication likely lasts 10 days in the system and if taken within 3 weeks of last dose, he does not need any dose adjustment to resume.

## 2024-02-11 NOTE — Telephone Encounter (Signed)
 Patient out of town, forgot his shot at home and pharmacy won't fill bc it is early    Copied from CRM [id number]. Topic: Clinical - Medication Question >> Feb 11, 2024  7:46 AM [name] wrote: Reason for CRM: The patient's been directed by their pharmacy to contact their PCP and request additional information/approval regarding their recent prescription of tirzepatide  (MOUNJARO ) 5 MG/0.5ML Pen [[id number]]. Please contact the patient further if/when possible >> Feb 11, 2024  4:32 PM Delon DASEN wrote: Pharmacy will not fill because it is early but he is out of town and forgot it at home- [phone] >> Feb 11, 2024 12:24 PM [name] wrote: Patient called back again as he said he need his tirzepatide  (MOUNJARO ) 5 MG/0.5ML Pen today. Patient said he takes his shot on Sundays so he is a day behind. Please contact pharmacy for approval.

## 2024-02-11 NOTE — Telephone Encounter (Signed)
 Copied from CRM [id number]. Topic: Clinical - Medication Question >> Feb 11, 2024  7:46 AM [name] wrote: Reason for CRM: The patient's been directed by their pharmacy to contact their PCP and request additional information/approval regarding their recent prescription of tirzepatide  (MOUNJARO ) 5 MG/0.5ML Pen [[id number]]. Please contact the patient further if/when possible >> Feb 11, 2024 12:24 PM [name] wrote: Patient called back again as he said he need his tirzepatide  (MOUNJARO ) 5 MG/0.5ML Pen today. Patient said he takes his shot on Sundays so he is a day behind. Please contact pharmacy for approval.

## 2024-02-11 NOTE — Telephone Encounter (Signed)
 Let's try to get PA done before giving sample.  He'd have to do 2 shots instead of 1 anyways.

## 2024-02-12 NOTE — Telephone Encounter (Signed)
 Duplicate encounter. Encounter closed.

## 2024-02-12 NOTE — Telephone Encounter (Signed)
 Copied from CRM [id number]. Topic: Clinical - Prescription Issue >> Feb 12, 2024  8:45 AM Essie A wrote: Reason for CRM: Patient returned the call this morning.  He is not pleased with the process and the doctor.  He already has gotten his medicine and is taking right now.  He just wanted someone to know that he was displeased

## 2024-02-12 NOTE — Telephone Encounter (Signed)
 Myrla Jon HERO, MD to [facility] Nurse (Selected Message)    02/11/24  4:58 PM Note Ok to give approval to pharmacy to fill early. If he is unable to fill early due to cost/insurance, we can assure him that medication likely lasts 10 days in the system and if taken within 3 weeks of last dose, he does not need any dose adjustment to resume.

## 2024-02-12 NOTE — Telephone Encounter (Signed)
 Copied from CRM [id number]. Topic: Clinical - Prescription Issue >> Feb 11, 2024  4:32 PM Delon DASEN wrote: Reason for CRM: tirzepatide  (MOUNJARO ) 5 MG/0.5ML Pen- too soon to fill but he left it at home and is out of town

## 2024-02-13 ENCOUNTER — Other Ambulatory Visit (HOSPITAL_COMMUNITY): Payer: Self-pay

## 2024-02-13 NOTE — Telephone Encounter (Signed)
 Per test claim, no PA is needed at this time, pt has been able to fill medication at the pharmacy. Closing request

## 2024-02-19 ENCOUNTER — Ambulatory Visit: Admitting: Family Medicine

## 2024-02-20 ENCOUNTER — Other Ambulatory Visit: Payer: Self-pay | Admitting: Family Medicine

## 2024-03-04 ENCOUNTER — Encounter: Payer: Self-pay | Admitting: Family Medicine

## 2024-03-04 ENCOUNTER — Ambulatory Visit: Admitting: Family Medicine

## 2024-03-04 VITALS — BP 139/78 | HR 62 | Wt 225.9 lb

## 2024-03-04 DIAGNOSIS — E1165 Type 2 diabetes mellitus with hyperglycemia: Secondary | ICD-10-CM | POA: Diagnosis not present

## 2024-03-04 DIAGNOSIS — I152 Hypertension secondary to endocrine disorders: Secondary | ICD-10-CM

## 2024-03-04 DIAGNOSIS — G4733 Obstructive sleep apnea (adult) (pediatric): Secondary | ICD-10-CM | POA: Diagnosis not present

## 2024-03-04 DIAGNOSIS — E1169 Type 2 diabetes mellitus with other specified complication: Secondary | ICD-10-CM | POA: Diagnosis not present

## 2024-03-04 DIAGNOSIS — Z7985 Long-term (current) use of injectable non-insulin antidiabetic drugs: Secondary | ICD-10-CM | POA: Diagnosis not present

## 2024-03-04 DIAGNOSIS — E785 Hyperlipidemia, unspecified: Secondary | ICD-10-CM

## 2024-03-04 DIAGNOSIS — E1159 Type 2 diabetes mellitus with other circulatory complications: Secondary | ICD-10-CM

## 2024-03-04 DIAGNOSIS — E119 Type 2 diabetes mellitus without complications: Secondary | ICD-10-CM

## 2024-03-04 MED ORDER — PIOGLITAZONE HCL 30 MG PO TABS: 30.0000 mg | ORAL_TABLET | Freq: Every day | ORAL | 1 refills | Status: DC

## 2024-03-04 MED ORDER — TIRZEPATIDE 7.5 MG/0.5ML ~~LOC~~ SOAJ
7.5000 mg | SUBCUTANEOUS | 1 refills | Status: DC
Start: 2024-03-04 — End: 2024-06-03

## 2024-03-04 NOTE — Assessment & Plan Note (Signed)
 Currently well-controlled on atorvastatin . Reports no adverse side effects. Last lipid panel in April showed stable results. - Continue atorvastatin  40 mg daily

## 2024-03-04 NOTE — Assessment & Plan Note (Signed)
 Current well-managed on CPAP machine. Reports improvement with use, but was curious in learning about new Inspire implant to remove need for mask at night. - Discussed how new Inspire implant works for OSA - Offered ambulatory referral to ENT for further discussion and evaluation; patient would like to wait at this time

## 2024-03-04 NOTE — Progress Notes (Unsigned)
 Established Patient Office Visit  Subjective   Patient ID: Ivan Kennedy, male    DOB: August 08, 1946  Age: 78 y.o. MRN: [id number]  Chief Complaint  Patient presents with   Medical Management of Chronic Issues    Patient is present for 3 month follow-up.    Diabetes    Last A1c 6.6% Patient was to continue Mounjaro  5 mg weekly, continue Jardiance ,plan to reduce and eventually discontinue Actos  once glycemic control is stable Avg home reading 130-150 mg/dL  Symptoms: none   Hypertension   Hyperlipidemia    Ivan Kennedy is a 78 yo male with a history of HTN, T2DM, and HLD who presents to the clinic for follow up management of chronic conditions.  On interview, he reports feeling good overall. He states that his hypertension has been controlled at home without any symptoms. He does not take home measurements but has been taking his medication without any side effects. He also reports having good control of his diabetes, measuring his blood sugars every morning and taking all three of his medications. He denies any trouble with his medications or adverse side effects. He also reports continuing to manage his high cholesterol with medication. He reports no other concerns today, but is interested in learning more about new CPAP technology and addressing his health maintenance goals.   {History (Optional):23778}  ROS    Objective:     BP 139/78 (BP Location: Right Arm, Patient Position: Sitting, Cuff Size: Normal)   Pulse 62   Wt 225 lb 14.4 oz (102.5 kg)   SpO2 99%   BMI 29.80 kg/m  {Vitals History (Optional):23777}  Physical Exam Vitals reviewed.  Constitutional:      General: He is not in acute distress.    Appearance: Normal appearance. He is not ill-appearing.  HENT:     Right Ear: Tympanic membrane, ear canal and external ear normal.     Left Ear: Tympanic membrane, ear canal and external ear normal.     Nose: Nose normal.     Mouth/Throat:     Mouth: Mucous membranes  are moist.     Pharynx: Oropharynx is clear.  Eyes:     General: No scleral icterus.    Conjunctiva/sclera: Conjunctivae normal.     Pupils: Pupils are equal, round, and reactive to light.  Cardiovascular:     Rate and Rhythm: Normal rate and regular rhythm.     Pulses: Normal pulses.  Pulmonary:     Effort: Pulmonary effort is normal. No respiratory distress.     Breath sounds: Normal breath sounds. No wheezing.  Abdominal:     General: Bowel sounds are normal. There is no distension.     Palpations: Abdomen is soft.  Musculoskeletal:     Cervical back: Normal range of motion.     Right lower leg: No edema.     Left lower leg: No edema.  Lymphadenopathy:     Cervical: No cervical adenopathy.  Skin:    General: Skin is warm and dry.     Capillary Refill: Capillary refill takes less than 2 seconds.  Neurological:     Mental Status: He is alert and oriented to person, place, and time.  Psychiatric:        Mood and Affect: Mood normal.      No results found for any visits on 03/04/24.  {Labs (Optional):23779}  The ASCVD Risk score (Arnett DK, et al., 2019) failed to calculate for the following reasons:   Risk score  cannot be calculated because patient has a medical history suggesting prior/existing ASCVD    Assessment & Plan:   Problem List Items Addressed This Visit       Cardiovascular and Mediastinum   Hypertension associated with diabetes (HCC) - Primary   Current well-controlled on losartan  and metoprolol . Does not measure pressures at home, but denies any symptoms or medication side effects. Pressures in the office today were 142/74 with repeat measurement of 139/78. Last CMP in April was stable. - Continue losartan  100 mg daily - Continue metoprolol  XL 25 mg daily - Collected CMP at next follow up visit      Relevant Medications   tirzepatide  (MOUNJARO ) 7.5 MG/0.5ML Pen   pioglitazone  (ACTOS ) 30 MG tablet     Respiratory   OSA on CPAP   Current  well-managed on CPAP machine. Reports improvement with use, but was curious in learning about new Inspire implant to remove need for mask at night. - Discussed how new Inspire implant works for OSA - Offered ambulatory referral to ENT for further discussion and evaluation; patient would like to wait at this time        Endocrine   Type 2 diabetes mellitus without complications (HCC)   Current controlled on medication regimen. Reports no episodes of hypoglycemia or adverse medication side effects. Current A1C is 7.0, up from 6.6 last time. Reports that morning sugars run between 130-150. Plan is to eventually discontinue Actos , but will continue given rise in A1C. Last CMP in April was stable - Increase Mounjaro  to 7.5 mg weekly - Continue Jardiance  25 mg daily - Continue Actos  30 mg daily; will reassess glycemic control in 3 months to determine if we can reduce dosage (and eventually discontinue) - Order PoC A1C (resulted 7.0) - Order Urine Pro/Cr ratio - Collect CMP during next visit      Relevant Medications   tirzepatide  (MOUNJARO ) 7.5 MG/0.5ML Pen   pioglitazone  (ACTOS ) 30 MG tablet   Other Relevant Orders   Urine Microalbumin w/creat. ratio   POCT HgB A1C   Hyperlipidemia associated with type 2 diabetes mellitus (HCC)   Currently well-controlled on atorvastatin . Reports no adverse side effects. Last lipid panel in April showed stable results. - Continue atorvastatin  40 mg daily      Relevant Medications   tirzepatide  (MOUNJARO ) 7.5 MG/0.5ML Pen   pioglitazone  (ACTOS ) 30 MG tablet   Other Visit Diagnoses       Type 2 diabetes mellitus with hyperglycemia, without long-term current use of insulin  (HCC)       Relevant Medications   tirzepatide  (MOUNJARO ) 7.5 MG/0.5ML Pen   pioglitazone  (ACTOS ) 30 MG tablet      General Health Maintenance Discussed wellness and preventative screening measures to achieve health maintenance goals. - Encouraged flu and Covid vaccination in the  fall from the pharmacy - Encouraged Tdap and shingles vaccination at local pharmacy - Up to date on rest of screening/vaccination measures currently - Performed diabetic foot exam with normal findings:  Diabetic Foot Exam - Simple   Simple Foot Form Diabetic Foot exam was performed with the following findings: Yes 03/04/2024  9:54 AM  Visual Inspection No deformities, no ulcerations, no other skin breakdown bilaterally: Yes Sensation Testing Intact to touch and monofilament testing bilaterally: Yes Pulse Check Posterior Tibialis and Dorsalis pulse intact bilaterally: Yes Comments     Return in about 3 months (around 06/04/2024) for chronic disease f/u.    Ivan Kennedy, Medical Student

## 2024-03-04 NOTE — Assessment & Plan Note (Addendum)
 Current well-controlled on losartan  and metoprolol . Does not measure pressures at home, but denies any symptoms or medication side effects. Pressures in the office today were 142/74 with repeat measurement of 139/78. Last CMP in April was stable. - Continue losartan  100 mg daily - Continue metoprolol  XL 25 mg daily - Collected CMP at next follow up visit

## 2024-03-04 NOTE — Assessment & Plan Note (Addendum)
 Current controlled on medication regimen. Reports no episodes of hypoglycemia or adverse medication side effects. Current A1C is 7.0, up from 6.6 last time. Reports that morning sugars run between 130-150. Plan is to eventually discontinue Actos , but will continue given rise in A1C. Last CMP in April was stable - Increase Mounjaro  to 7.5 mg weekly - Continue Jardiance  25 mg daily - Continue Actos  30 mg daily; will reassess glycemic control in 3 months to determine if we can reduce dosage (and eventually discontinue) - Order PoC A1C (resulted 7.0) - Order Urine Pro/Cr ratio - Collect CMP during next visit

## 2024-03-05 LAB — MICROALBUMIN / CREATININE URINE RATIO
Creatinine, Urine: 162.7 mg/dL
Microalb/Creat Ratio: 5 mg/g{creat} (ref 0–29)
Microalbumin, Urine: 7.6 ug/mL

## 2024-03-05 LAB — POCT GLYCOSYLATED HEMOGLOBIN (HGB A1C): Hemoglobin A1C: 7 % — AB (ref 4.0–5.6)

## 2024-03-06 ENCOUNTER — Ambulatory Visit: Payer: Self-pay | Admitting: Family Medicine

## 2024-03-12 ENCOUNTER — Other Ambulatory Visit: Payer: Self-pay | Admitting: Family Medicine

## 2024-03-24 ENCOUNTER — Telehealth: Payer: Self-pay | Admitting: Family Medicine

## 2024-03-24 DIAGNOSIS — I890 Lymphedema, not elsewhere classified: Secondary | ICD-10-CM | POA: Diagnosis not present

## 2024-03-24 DIAGNOSIS — I872 Venous insufficiency (chronic) (peripheral): Secondary | ICD-10-CM | POA: Diagnosis not present

## 2024-03-24 DIAGNOSIS — L570 Actinic keratosis: Secondary | ICD-10-CM | POA: Diagnosis not present

## 2024-03-24 DIAGNOSIS — L2089 Other atopic dermatitis: Secondary | ICD-10-CM | POA: Diagnosis not present

## 2024-03-24 DIAGNOSIS — L821 Other seborrheic keratosis: Secondary | ICD-10-CM | POA: Diagnosis not present

## 2024-03-24 NOTE — Telephone Encounter (Signed)
 Pt came by the office and brought his office note from [facility] dermatology.  He is wanting to make sure that none of the medications they gave him are going to interact with what he is already taking.  He states that his leg itches really bad and wants to make sure the swelling is not something he should be worried about.  He states that it is not swollen bad and it also concerns him because he has had a stroke.  Placing the office note in Dr. Rona box.

## 2024-03-24 NOTE — Telephone Encounter (Signed)
 Copied from CRM [id number]. Topic: Clinical - Medical Advice >> Mar 24, 2024 11:18 AM Vena H wrote: Reason for CRM: Pt is wanting to speak with his provider or her nurse. States he had to go to his dermatologist this morning and he is wanting a call about the medication that was given

## 2024-03-25 NOTE — Telephone Encounter (Signed)
 Looked over medications that are listed in his note from Dermatology. No interference with current meds. Prednisone may temporarily raise blood sugar.  Hydroxyzine can cause drowsiness.  She is treating stasis dermatitis, which is fro mthe swelling. The swelling seems to be from the veins not working as well as they used to. Agree with Dr Arlyss re compression socks. If swelling is worsening, happy to see him. Does he want this note back or for us  to scan to chart?

## 2024-03-25 NOTE — Telephone Encounter (Signed)
 Patient advised. Report no need for note to be scan

## 2024-04-07 ENCOUNTER — Other Ambulatory Visit: Payer: Self-pay | Admitting: Family Medicine

## 2024-04-08 NOTE — Telephone Encounter (Signed)
 Requested Prescriptions  Pending Prescriptions Disp Refills   losartan  (COZAAR ) 100 MG tablet [Pharmacy Med Name: LOSARTAN  POTASSIUM 100 MG TAB] 90 tablet 0    Sig: TAKE 1 TABLET BY MOUTH EVERY DAY     Cardiovascular:  Angiotensin Receptor Blockers Passed - 04/08/2024  1:11 PM      Passed - Cr in normal range and within 180 days    Creat  Date Value Ref Range Status  04/26/2017 1.00 0.70 - 1.25 mg/dL Final    Comment:    For patients >76 years of age, the reference limit for Creatinine is approximately 13% higher for people identified as African-American. .    Creatinine, Ser  Date Value Ref Range Status  11/15/2023 1.02 0.76 - 1.27 mg/dL Final         Passed - K in normal range and within 180 days    Potassium  Date Value Ref Range Status  11/15/2023 3.9 3.5 - 5.2 mmol/L Final         Passed - Patient is not pregnant      Passed - Last BP in normal range    BP Readings from Last 1 Encounters:  03/04/24 139/78         Passed - Valid encounter within last 6 months    Recent Outpatient Visits           1 month ago Hypertension associated with diabetes [facility])   [place] [facility] [place], Jon HERO, MD   4 months ago Type 2 diabetes mellitus without complication, without long-term current use of insulin  [facility])   [place] [facility] [place], Jon HERO, MD

## 2024-04-24 DIAGNOSIS — D225 Melanocytic nevi of trunk: Secondary | ICD-10-CM | POA: Diagnosis not present

## 2024-04-24 DIAGNOSIS — L718 Other rosacea: Secondary | ICD-10-CM | POA: Diagnosis not present

## 2024-04-24 DIAGNOSIS — I872 Venous insufficiency (chronic) (peripheral): Secondary | ICD-10-CM | POA: Diagnosis not present

## 2024-04-24 DIAGNOSIS — Z859 Personal history of malignant neoplasm, unspecified: Secondary | ICD-10-CM | POA: Diagnosis not present

## 2024-04-24 DIAGNOSIS — Z872 Personal history of diseases of the skin and subcutaneous tissue: Secondary | ICD-10-CM | POA: Diagnosis not present

## 2024-04-24 DIAGNOSIS — D485 Neoplasm of uncertain behavior of skin: Secondary | ICD-10-CM | POA: Diagnosis not present

## 2024-04-24 DIAGNOSIS — L578 Other skin changes due to chronic exposure to nonionizing radiation: Secondary | ICD-10-CM | POA: Diagnosis not present

## 2024-04-24 DIAGNOSIS — Z85828 Personal history of other malignant neoplasm of skin: Secondary | ICD-10-CM | POA: Diagnosis not present

## 2024-04-29 DIAGNOSIS — G4733 Obstructive sleep apnea (adult) (pediatric): Secondary | ICD-10-CM | POA: Diagnosis not present

## 2024-05-14 ENCOUNTER — Other Ambulatory Visit: Payer: Self-pay | Admitting: Family Medicine

## 2024-06-03 ENCOUNTER — Encounter: Payer: Self-pay | Admitting: Family Medicine

## 2024-06-03 ENCOUNTER — Ambulatory Visit: Admitting: Family Medicine

## 2024-06-03 VITALS — BP 128/68 | HR 68 | Ht 73.0 in | Wt 227.0 lb

## 2024-06-03 DIAGNOSIS — N401 Enlarged prostate with lower urinary tract symptoms: Secondary | ICD-10-CM

## 2024-06-03 DIAGNOSIS — R270 Ataxia, unspecified: Secondary | ICD-10-CM

## 2024-06-03 DIAGNOSIS — Z8673 Personal history of transient ischemic attack (TIA), and cerebral infarction without residual deficits: Secondary | ICD-10-CM | POA: Diagnosis not present

## 2024-06-03 DIAGNOSIS — E1159 Type 2 diabetes mellitus with other circulatory complications: Secondary | ICD-10-CM | POA: Diagnosis not present

## 2024-06-03 DIAGNOSIS — E1169 Type 2 diabetes mellitus with other specified complication: Secondary | ICD-10-CM

## 2024-06-03 DIAGNOSIS — K219 Gastro-esophageal reflux disease without esophagitis: Secondary | ICD-10-CM | POA: Diagnosis not present

## 2024-06-03 DIAGNOSIS — I152 Hypertension secondary to endocrine disorders: Secondary | ICD-10-CM

## 2024-06-03 DIAGNOSIS — E785 Hyperlipidemia, unspecified: Secondary | ICD-10-CM

## 2024-06-03 DIAGNOSIS — Z23 Encounter for immunization: Secondary | ICD-10-CM

## 2024-06-03 DIAGNOSIS — R351 Nocturia: Secondary | ICD-10-CM | POA: Diagnosis not present

## 2024-06-03 DIAGNOSIS — E119 Type 2 diabetes mellitus without complications: Secondary | ICD-10-CM

## 2024-06-03 DIAGNOSIS — Z7984 Long term (current) use of oral hypoglycemic drugs: Secondary | ICD-10-CM

## 2024-06-03 MED ORDER — TAMSULOSIN HCL 0.4 MG PO CAPS: 0.4000 mg | ORAL_CAPSULE | Freq: Every day | ORAL | 1 refills | Status: AC

## 2024-06-03 MED ORDER — TIRZEPATIDE 7.5 MG/0.5ML ~~LOC~~ SOAJ: 7.5000 mg | SUBCUTANEOUS | 1 refills | Status: AC

## 2024-06-03 MED ORDER — PANTOPRAZOLE SODIUM 20 MG PO TBEC
20.0000 mg | DELAYED_RELEASE_TABLET | Freq: Every day | ORAL | 1 refills | Status: AC
Start: 2024-06-03 — End: ?

## 2024-06-03 MED ORDER — ATORVASTATIN CALCIUM 40 MG PO TABS: 40.0000 mg | ORAL_TABLET | Freq: Every day | ORAL | 1 refills | Status: AC

## 2024-06-03 MED ORDER — LOSARTAN POTASSIUM 100 MG PO TABS
100.0000 mg | ORAL_TABLET | Freq: Every day | ORAL | 0 refills | Status: AC
Start: 2024-06-03 — End: ?

## 2024-06-03 MED ORDER — ASPIRIN 81 MG PO TBEC: 81.0000 mg | DELAYED_RELEASE_TABLET | Freq: Every day | ORAL | 3 refills | Status: AC

## 2024-06-03 NOTE — Assessment & Plan Note (Signed)
 Benign prostatic hyperplasia with nocturia managed with Flomax . Current dose of two tablets is not providing significant improvement and may contribute to dizziness. - Reduced Flomax  to one tablet to assess impact on dizziness and urinary symptoms.

## 2024-06-03 NOTE — Progress Notes (Signed)
 Established patient visit   Patient: Ivan Kennedy   DOB: 1946-04-12   78 y.o. Male  MRN: [id number] Visit Date: 06/03/2024  Today's healthcare provider: Jon Eva, MD   Chief Complaint  Patient presents with   Medical Management of Chronic Issues   Hypertension    He reports checking at home. Reports symptoms of ankles swelling a little bit   Diabetes   Hyperlipidemia   Subjective    Hypertension  Diabetes  Hyperlipidemia   HPI     Hypertension    Additional comments: He reports checking at home. Reports symptoms of ankles swelling a little bit      Last edited by Lilian Fitzpatrick, CMA on 06/03/2024  8:48 AM.       Discussed the use of AI scribe software for clinical note transcription with the patient, who gave verbal consent to proceed.  History of Present Illness   Ivan Kennedy is a 78 year old male with a history of stroke who presents with dizziness and balance issues.  He experiences dizziness and balance issues, described as presyncope rather than vertigo, which have worsened over time. These symptoms are more pronounced when bending over or walking and are more severe than immediately after his stroke last Christmas. The dizziness is intense but subsides. He is concerned about potential changes since his last stroke, which affected the back of his brain.  No new weakness, numbness, speech changes, or vision changes are present, although he has ongoing vision issues related to cataracts. He completed physical therapy after his stroke but continues to experience balance difficulties, particularly when walking down the aisle at church, where he feels the need to stay close to the pews for support.  He has diabetes, hypertension, hyperlipidemia, and diastolic heart failure. His medications include Mounjaro , Actos , losartan , metoprolol , Jardiance , atorvastatin , and a baby aspirin . The increased dose of Mounjaro  is effective with tolerable side  effects, and he hopes to reduce his A1c further to discontinue Actos .  He takes Flomax  for urination issues, which provides limited improvement despite an increased dose. He urinates two to three times a night and notes that Flomax  may contribute to his dizziness.         Medications: Outpatient Medications Prior to Visit  Medication Sig   Accu-Chek Softclix Lancets lancets See admin instructions.   Azelaic Acid 15 % gel Apply topically every morning.   Blood Glucose Monitoring Suppl (ONE TOUCH ULTRA 2) w/Device KIT To check blood sugar once daily   doxycycline  (ADOXA) 50 MG tablet Take 50 mg by mouth daily.   glucose blood (ONETOUCH ULTRA) test strip CHECK BLOOD SUGAR ONCE DAILY   JARDIANCE  25 MG TABS tablet TAKE 1 TABLET BY MOUTH DAILY BEFORE BREAKFAST.   Lancets (ACCU-CHEK SOFT TOUCH) lancets To check blood glucose daily   loratadine (CLARITIN) 10 MG tablet Take 10 mg by mouth daily.   metoprolol  succinate (TOPROL -XL) 25 MG 24 hr tablet TAKE 1 TABLET (25 MG TOTAL) BY MOUTH DAILY.   metroNIDAZOLE (METROGEL) 1 % gel Apply 1 application topically every morning.   ONETOUCH ULTRA test strip CHECK BLOOD SUGAR ONCE DAILY   pioglitazone  (ACTOS ) 30 MG tablet Take 1 tablet (30 mg total) by mouth daily.   tretinoin  (RETIN-A ) 0.05 % cream Apply 1 application  topically at bedtime.   vitamin B-12 (CYANOCOBALAMIN ) 1000 MCG tablet Take 1,000 mcg by mouth daily.   [DISCONTINUED] aspirin  EC 81 MG tablet Take 1 tablet (81 mg total) by  mouth daily. Swallow whole.   [DISCONTINUED] atorvastatin  (LIPITOR) 40 MG tablet TAKE 1 TABLET BY MOUTH EVERY DAY   [DISCONTINUED] losartan  (COZAAR ) 100 MG tablet TAKE 1 TABLET BY MOUTH EVERY DAY   [DISCONTINUED] pantoprazole  (PROTONIX ) 20 MG tablet TAKE 1 TABLET BY MOUTH EVERY DAY   [DISCONTINUED] tamsulosin  (FLOMAX ) 0.4 MG CAPS capsule Take 2 capsules (0.8 mg total) by mouth daily.   [DISCONTINUED] tirzepatide  (MOUNJARO ) 7.5 MG/0.5ML Pen Inject 7.5 mg into the skin  once a week.   ARTIFICIAL TEARS 1 % ophthalmic solution  (Patient not taking: Reported on 06/03/2024)   No facility-administered medications prior to visit.    Review of Systems     Objective    BP 128/68 (BP Location: Left Arm, Patient Position: Sitting, Cuff Size: Large)   Pulse 68   Ht 6' 1 (1.854 m)   Wt 227 lb (103 kg)   SpO2 100%   BMI 29.95 kg/m    Physical Exam Vitals reviewed.  Constitutional:      General: He is not in acute distress.    Appearance: Normal appearance. He is not diaphoretic.  HENT:     Head: Normocephalic and atraumatic.  Eyes:     General: No scleral icterus.    Conjunctiva/sclera: Conjunctivae normal.  Cardiovascular:     Rate and Rhythm: Normal rate and regular rhythm.     Heart sounds: Normal heart sounds. No murmur heard. Pulmonary:     Effort: Pulmonary effort is normal. No respiratory distress.     Breath sounds: Normal breath sounds. No wheezing or rhonchi.  Musculoskeletal:     Cervical back: Neck supple.     Right lower leg: No edema.     Left lower leg: No edema.  Lymphadenopathy:     Cervical: No cervical adenopathy.  Skin:    General: Skin is warm and dry.     Findings: No rash.  Neurological:     Mental Status: He is alert and oriented to person, place, and time. Mental status is at baseline.     Cranial Nerves: No cranial nerve deficit.     Sensory: No sensory deficit.     Motor: No weakness.     Gait: Gait abnormal (slightly ataxic on normal gait, very ataxic on tandem walking).  Psychiatric:        Mood and Affect: Mood normal.        Behavior: Behavior normal.      No results found for any visits on 06/03/24.  Assessment & Plan     Problem List Items Addressed This Visit       Cardiovascular and Mediastinum   Hypertension associated with diabetes (HCC)   Managed with losartan  and metoprolol . Blood pressure was initially elevated but improved upon recheck. - Continue current antihypertensive regimen. -  Rechecked blood pressure during the visit.      Relevant Medications   aspirin  EC 81 MG tablet   atorvastatin  (LIPITOR) 40 MG tablet   losartan  (COZAAR ) 100 MG tablet   tirzepatide  (MOUNJARO ) 7.5 MG/0.5ML Pen   Other Relevant Orders   Comprehensive metabolic panel with GFR     Digestive   Gastroesophageal reflux disease without esophagitis   Relevant Medications   pantoprazole  (PROTONIX ) 20 MG tablet     Endocrine   Type 2 diabetes mellitus without complications (HCC)   Managed with Mounjaro , Actos , and Jardiance . Recent increase in Mounjaro  to 7.5 mg weekly. Goal is to reduce Actos  once A1c is below 7.0. Current A1c is  7.0, with a target to reduce it further. - Ordered blood work to check A1c, cholesterol, kidney, and liver function. - Continue Mounjaro  7.5 mg weekly. - Will evaluate A1c results to determine if Actos  can be discontinued.      Relevant Medications   aspirin  EC 81 MG tablet   atorvastatin  (LIPITOR) 40 MG tablet   losartan  (COZAAR ) 100 MG tablet   tirzepatide  (MOUNJARO ) 7.5 MG/0.5ML Pen   Other Relevant Orders   Comprehensive metabolic panel with GFR   Hemoglobin A1c   Hyperlipidemia associated with type 2 diabetes mellitus (HCC)   Managed with atorvastatin . - Ordered blood work to check cholesterol levels.      Relevant Medications   aspirin  EC 81 MG tablet   atorvastatin  (LIPITOR) 40 MG tablet   losartan  (COZAAR ) 100 MG tablet   tirzepatide  (MOUNJARO ) 7.5 MG/0.5ML Pen   Other Relevant Orders   Comprehensive metabolic panel with GFR   Lipid Panel With LDL/HDL Ratio     Other   Benign prostatic hyperplasia with nocturia   Benign prostatic hyperplasia with nocturia managed with Flomax . Current dose of two tablets is not providing significant improvement and may contribute to dizziness. - Reduced Flomax  to one tablet to assess impact on dizziness and urinary symptoms.      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 Symptoms include dizziness, balance issues, and occasional severe episodes. No new weakness, numbness, speech, or vision changes. Differential includes residual effects from the previous stroke versus new neurological events. Flomax  may contribute to dizziness. - Ordered MRI to evaluate for new changes in the brain. - Performed neurological examination. - Will consider referral to physical therapy for balance and dizziness management. - Reduced Flomax  to one tablet to assess impact on dizziness.  General Health Maintenance Routine health maintenance discussed, including diabetic eye exams and medication refills. - Scheduled diabetic eye exam for February. - Provided refills for medications.        I personally spent a total of 46 minutes in the care of the patient today including preparing to see the patient, getting/reviewing separately obtained history, performing a medically appropriate exam/evaluation, counseling and educating, placing orders, documenting clinical information in the EHR, and coordinating care.    Return in about 6 months (around 12/02/2024) for CPE.       Jon Eva, MD  [facility] [phone] (phone) [phone] (fax)  [facility]

## 2024-06-03 NOTE — Assessment & Plan Note (Signed)
 Managed with losartan  and metoprolol . Blood pressure was initially elevated but improved upon recheck. - Continue current antihypertensive regimen. - Rechecked blood pressure during the visit.

## 2024-06-03 NOTE — Assessment & Plan Note (Signed)
 Managed with Mounjaro , Actos , and Jardiance . Recent increase in Mounjaro  to 7.5 mg weekly. Goal is to reduce Actos  once A1c is below 7.0. Current A1c is 7.0, with a target to reduce it further. - Ordered blood work to check A1c, cholesterol, kidney, and liver function. - Continue Mounjaro  7.5 mg weekly. - Will evaluate A1c results to determine if Actos  can be discontinued.

## 2024-06-03 NOTE — Assessment & Plan Note (Signed)
 Managed with atorvastatin . - Ordered blood work to check cholesterol levels.

## 2024-06-04 LAB — HEMOGLOBIN A1C
Est. average glucose Bld gHb Est-mCnc: 148 mg/dL
Hgb A1c MFr Bld: 6.8 % — ABNORMAL HIGH (ref 4.8–5.6)

## 2024-06-04 LAB — COMPREHENSIVE METABOLIC PANEL WITH GFR
ALT: 19 IU/L (ref 0–44)
AST: 20 IU/L (ref 0–40)
Albumin: 4.1 g/dL (ref 3.8–4.8)
Alkaline Phosphatase: 58 IU/L (ref 47–123)
BUN/Creatinine Ratio: 14 (ref 10–24)
BUN: 14 mg/dL (ref 8–27)
Bilirubin Total: 1.4 mg/dL — ABNORMAL HIGH (ref 0.0–1.2)
CO2: 23 mmol/L (ref 20–29)
Calcium: 9.6 mg/dL (ref 8.6–10.2)
Chloride: 104 mmol/L (ref 96–106)
Creatinine, Ser: 0.97 mg/dL (ref 0.76–1.27)
Globulin, Total: 1.8 g/dL (ref 1.5–4.5)
Glucose: 108 mg/dL — ABNORMAL HIGH (ref 70–99)
Potassium: 3.4 mmol/L — ABNORMAL LOW (ref 3.5–5.2)
Sodium: 142 mmol/L (ref 134–144)
Total Protein: 5.9 g/dL — ABNORMAL LOW (ref 6.0–8.5)
eGFR: 81 mL/min/1.73 (ref >59)

## 2024-06-04 LAB — LIPID PANEL WITH LDL/HDL RATIO
Cholesterol, Total: 93 mg/dL — ABNORMAL LOW (ref 100–199)
HDL: 48 mg/dL (ref >39)
LDL Chol Calc (NIH): 31 mg/dL (ref 0–99)
LDL/HDL Ratio: 0.6 ratio (ref 0.0–3.6)
Triglycerides: 65 mg/dL (ref 0–149)
VLDL Cholesterol Cal: 14 mg/dL (ref 5–40)

## 2024-06-06 ENCOUNTER — Ambulatory Visit: Payer: Self-pay | Admitting: Family Medicine

## 2024-06-09 ENCOUNTER — Ambulatory Visit: Payer: Self-pay

## 2024-06-09 NOTE — Telephone Encounter (Signed)
 Pt advised. Verbalized understanding.

## 2024-06-09 NOTE — Telephone Encounter (Signed)
 Start eating more fiber (fruits and veggies) or taking a daily fiber supplement.  This helps with the Mounjaro  constipation. Also take miralax  1 capful daily and stay well hydrated.

## 2024-06-09 NOTE — Telephone Encounter (Addendum)
 FYI Only or Action Required?: Action required by provider: clinical question for provider.  Patient was last seen in primary care on 06/03/2024 by Myrla Jon HERO, MD.  Called Nurse Triage reporting Constipation.  Symptoms began a week ago.  Interventions attempted: Nothing.  Symptoms are: gradually worsening.  Triage Disposition: Call PCP When Office is Open (overriding Home Care)  Patient/caregiver understands and will follow disposition?: Yes                                 Summary: Constipation   Reason for Triage: constipation would like recommendation on what OTC medicine he can take to assist or if the provider can call in medication. Reason for Disposition  MILD constipation  Answer Assessment - Initial Assessment Questions 1. STOOL PATTERN OR FREQUENCY: How often do you have a bowel movement (BM)?  (Normal range: 3 times a day to every 3 days)  When was your last BM?       2-3 days since last BM, normal is every other day  2. STRAINING: Do you have to strain to have a BM?      Yes 3. ONSET: When did the constipation begin?     Within last week 4. RECTAL PAIN: Does your rectum hurt when the stool comes out? If Yes, ask: Do you have hemorrhoids? How bad is the pain?  (Scale 1-10; or mild, moderate, severe)     Denies 5. BM COMPOSITION: Are the stools hard?      Yes 6. BLOOD ON STOOLS: Has there been any blood on the toilet tissue or on the surface of the BM? If Yes, ask: When was the last time?     Denies 7. CHRONIC CONSTIPATION: Is this a new problem for you?  If No, ask: How long have you had this problem? (days, weeks, months)      Denies, states this is a new problem 8. CHANGES IN DIET OR HYDRATION: Have there been any recent changes in your diet? How much fluids are you drinking on a daily basis?  How much have you had to drink today?     Denies 9. MEDICINES: Have you been taking any new medicines?  Are you taking any narcotic pain medicines? (e.g., Dilaudid, morphine, Percocet, Vicodin)     Mounjaro  2-3 months ago  10. LAXATIVES: Have you been using any stool softeners, laxatives, or enemas?  If Yes, ask What are you using, how often, and when was the last time?     Denies 11. ACTIVITY:  How much walking do you do every day?  Has your activity level decreased in the past week?      N/A 12. CAUSE: What do you think is causing the constipation?      Unsure, maybe Mounjaro   13. MEDICAL HISTORY: Do you have a history of hemorrhoids, rectal fissures, rectal surgery, or rectal abscess?       N/A 14. OTHER SYMPTOMS: Do you have any other symptoms? (e.g., abdomen pain, bloating, fever, vomiting)     Denies abdominal pain, denies bloating, denies vomiting, denies fever   Patient would like provider's specific recommendation on medications he can take to help relieve constipation. Please advise.  Protocols used: Constipation-A-AH

## 2024-06-13 ENCOUNTER — Ambulatory Visit
Admission: RE | Admit: 2024-06-13 | Discharge: 2024-06-13 | Disposition: A | Discharge status: Home / Self Care | Source: Ambulatory Visit | Attending: Family Medicine | Admitting: Family Medicine

## 2024-06-13 DIAGNOSIS — Z8673 Personal history of transient ischemic attack (TIA), and cerebral infarction without residual deficits: Secondary | ICD-10-CM | POA: Diagnosis not present

## 2024-06-13 DIAGNOSIS — R9082 White matter disease, unspecified: Secondary | ICD-10-CM | POA: Diagnosis not present

## 2024-06-13 DIAGNOSIS — R270 Ataxia, unspecified: Secondary | ICD-10-CM | POA: Insufficient documentation

## 2024-06-13 DIAGNOSIS — R29818 Other symptoms and signs involving the nervous system: Secondary | ICD-10-CM | POA: Diagnosis not present

## 2024-06-13 DIAGNOSIS — G9389 Other specified disorders of brain: Secondary | ICD-10-CM | POA: Diagnosis not present

## 2024-07-09 ENCOUNTER — Telehealth: Payer: Self-pay

## 2024-07-09 NOTE — Progress Notes (Signed)
 Pharmacy Quality Measure Review  This patient is appearing on a report for being at risk of failing the adherence measure for hypertension (ACEi/ARB) medications this calendar year.   Medication: losartan  Last fill date: 07/03/24 for 90 day supply  Insurance report was not up to date. No action needed at this time.   Leeandre Nordling E. Marsh, PharmD, CPP Clinical Pharmacist [facility] [phone]

## 2024-07-15 LAB — OPHTHALMOLOGY REPORT-SCANNED

## 2024-09-01 ENCOUNTER — Encounter: Payer: Self-pay | Admitting: Family Medicine

## 2024-09-01 ENCOUNTER — Ambulatory Visit (INDEPENDENT_AMBULATORY_CARE_PROVIDER_SITE_OTHER): Admitting: Family Medicine

## 2024-09-01 VITALS — BP 148/81 | HR 83 | Temp 97.6°F | Ht 73.0 in | Wt 229.6 lb

## 2024-09-01 DIAGNOSIS — M545 Low back pain, unspecified: Secondary | ICD-10-CM | POA: Diagnosis not present

## 2024-09-01 DIAGNOSIS — R1031 Right lower quadrant pain: Secondary | ICD-10-CM | POA: Diagnosis not present

## 2024-09-01 DIAGNOSIS — M25551 Pain in right hip: Secondary | ICD-10-CM

## 2024-09-01 MED ORDER — NAPROXEN 500 MG PO TABS: 500.0000 mg | ORAL_TABLET | Freq: Two times a day (BID) | ORAL | 0 refills | Status: AC

## 2024-09-01 NOTE — Progress Notes (Signed)
 "     Established patient visit   Patient: Ivan Kennedy   DOB: Dec 07, 1945   79 y.o. Male  MRN: [id number] Visit Date: 09/01/2024  Today's healthcare provider: LAURAINE LOISE BUOY, DO   Chief Complaint  Patient presents with   Acute Visit    Patient is here today due to right low back pain, states it is more along the right side.  Reports that it feels like someone is sticking a knife in there at times.  States that it has been going on now for about 2 weeks.  Went to [facility] and they done an xray stated that it did not show anything and that if it continues he needs to see his pcp.  They gave him a muscle relaxer as well as prednisone which did help it a little bit but the pain is still there.  Requesting a MRI.   Subjective    HPI Ivan Kennedy is a 79 year old male who presents with persistent right-sided pain after playing golf.  He has been experiencing right-sided pain for two weeks, which began while playing golf. The pain was severe enough to cause him to stop playing and was so intense the following morning that he was unable to get out of bed. The pain is localized from his side to his back and is exacerbated by movement such as turning or sitting, but not by lying down.  He sought care from an orthopedic specialist who performed x-rays, which, according to the patient, only showed bone and did not reveal anything else. He was prescribed a five-day course of prednisone and muscle relaxers, which provided some relief, but the pain has not fully resolved. He also visited a walk-in clinic where he received an injection, described as similar to ibuprofen, which temporarily alleviated the pain. However, the pain returned shortly after.  He has been performing stretching exercises and walking to keep the area loose. Despite these measures, he is concerned about the persistence of the pain and its impact on his ability to play golf.  No numbness, tingling, blood in urine or stool,  changes in bowel movements, fever, or chills.      Medications: Show/hide medication list[1]       Objective    BP (!) 148/81 (BP Location: Right Arm, Patient Position: Sitting)   Pulse 83   Temp 97.6 F (36.4 C) (Oral)   Ht 6' 1 (1.854 m)   Wt 229 lb 9.6 oz (104.1 kg)   SpO2 96%   BMI 30.29 kg/m     Physical Exam Vitals and nursing note reviewed.  Constitutional:      General: He is not in acute distress.    Appearance: Normal appearance.  HENT:     Head: Normocephalic and atraumatic.  Eyes:     General: No scleral icterus.    Conjunctiva/sclera: Conjunctivae normal.  Cardiovascular:     Rate and Rhythm: Normal rate.  Pulmonary:     Effort: Pulmonary effort is normal.  Musculoskeletal:     Lumbar back: Tenderness present. No spasms. Decreased range of motion (d/t right lateral pain at area of iliac crest).  Neurological:     Mental Status: He is alert and oriented to person, place, and time. Mental status is at baseline.  Psychiatric:        Mood and Affect: Mood normal.        Behavior: Behavior normal.      No results found for any visits on  09/01/24.  Assessment & Plan    Lateral pain of right hip -     MR ABDOMEN W WO CONTRAST; Future -     MR PELVIS WO CONTRAST; Future -     Naproxen ; Take 1 tablet (500 mg total) by mouth 2 (two) times daily with a meal for 5 days. Do NOT take other NSAIDs while taking this medication.  Dispense: 10 tablet; Refill: 0  Right lower quadrant abdominal pain -     MR ABDOMEN W WO CONTRAST; Future -     MR PELVIS WO CONTRAST; Future -     Naproxen ; Take 1 tablet (500 mg total) by mouth 2 (two) times daily with a meal for 5 days. Do NOT take other NSAIDs while taking this medication.  Dispense: 10 tablet; Refill: 0  Acute right-sided low back pain without sciatica -     MR ABDOMEN W WO CONTRAST; Future -     MR PELVIS WO CONTRAST; Future -     Naproxen ; Take 1 tablet (500 mg total) by mouth 2 (two) times daily with a  meal for 5 days. Do NOT take other NSAIDs while taking this medication.  Dispense: 10 tablet; Refill: 0   Pain likely muscular, possibly related to recent physical activity. Previous treatments provided temporary relief.  As pain has not been improving despite several courses of anti-inflammatory treatment as well as home physical therapy exercises including walking and stretching as directed by the orthopedic doctor, we will go ahead and evaluate with MRI for possible muscle/tendon tear. - Ordered MRI as noted - Prescribed short course of naproxen  to address inflammation - Patient to continue home physical therapy exercises and walking - Encouraged use of heat and ice (based on patient comfort/preference) several times per day.    Return if symptoms worsen or fail to improve.      I discussed the assessment and treatment plan with the patient  The patient was provided an opportunity to ask questions and all were answered. The patient agreed with the plan and demonstrated an understanding of the instructions.   The patient was advised to call back or seek an in-person evaluation if the symptoms worsen or if the condition fails to improve as anticipated.    LAURAINE LOISE BUOY, DO  [facility] [phone] (phone) [phone] (fax)  [facility]     [1]  Outpatient Medications Prior to Visit  Medication Sig   Accu-Chek Softclix Lancets lancets See admin instructions.   aspirin  EC 81 MG tablet Take 1 tablet (81 mg total) by mouth daily. Swallow whole.   atorvastatin  (LIPITOR) 40 MG tablet Take 1 tablet (40 mg total) by mouth daily.   Azelaic Acid 15 % gel Apply topically every morning.   Blood Glucose Monitoring Suppl (ONE TOUCH ULTRA 2) w/Device KIT To check blood sugar once daily   doxycycline  (ADOXA) 50 MG tablet Take 50 mg by mouth daily.   glucose blood (ONETOUCH ULTRA) test strip CHECK BLOOD SUGAR ONCE DAILY   JARDIANCE  25 MG TABS tablet  TAKE 1 TABLET BY MOUTH DAILY BEFORE BREAKFAST.   Lancets (ACCU-CHEK SOFT TOUCH) lancets To check blood glucose daily   loratadine (CLARITIN) 10 MG tablet Take 10 mg by mouth daily.   losartan  (COZAAR ) 100 MG tablet Take 1 tablet (100 mg total) by mouth daily.   metoprolol  succinate (TOPROL -XL) 25 MG 24 hr tablet TAKE 1 TABLET (25 MG TOTAL) BY MOUTH DAILY.   metroNIDAZOLE (METROGEL) 1 %  gel Apply 1 application topically every morning.   ONETOUCH ULTRA test strip CHECK BLOOD SUGAR ONCE DAILY   pantoprazole  (PROTONIX ) 20 MG tablet Take 1 tablet (20 mg total) by mouth daily.   tamsulosin  (FLOMAX ) 0.4 MG CAPS capsule Take 1 capsule (0.4 mg total) by mouth daily.   tirzepatide  (MOUNJARO ) 7.5 MG/0.5ML Pen Inject 7.5 mg into the skin once a week.   tretinoin  (RETIN-A ) 0.05 % cream Apply 1 application  topically at bedtime.   vitamin B-12 (CYANOCOBALAMIN ) 1000 MCG tablet Take 1,000 mcg by mouth daily.   [DISCONTINUED] ARTIFICIAL TEARS 1 % ophthalmic solution  (Patient not taking: Reported on 06/03/2024)   No facility-administered medications prior to visit.   "

## 2024-09-01 NOTE — Patient Instructions (Addendum)
"   Prescribed a short course of high-dose naproxen  to address inflammation.  Please do NOT take other NSAIDs (such as ibuprofen, meloxicam etc.) while taking this medication - Continue home physical therapy exercises and walking - Use heat and/or ice (based on your comfort/preference) several times per day. "

## 2024-09-04 ENCOUNTER — Ambulatory Visit
Admission: RE | Admit: 2024-09-04 | Discharge: 2024-09-04 | Disposition: A | Discharge status: Home / Self Care | Source: Ambulatory Visit | Attending: Family Medicine | Admitting: Family Medicine

## 2024-09-04 DIAGNOSIS — M545 Low back pain, unspecified: Secondary | ICD-10-CM | POA: Insufficient documentation

## 2024-09-04 DIAGNOSIS — M25551 Pain in right hip: Secondary | ICD-10-CM | POA: Diagnosis present

## 2024-09-04 DIAGNOSIS — R1031 Right lower quadrant pain: Secondary | ICD-10-CM | POA: Diagnosis present

## 2024-09-04 MED ORDER — GADOBUTROL 1 MMOL/ML IV SOLN
8.0000 mL | Freq: Once | INTRAVENOUS | Status: AC | PRN
  Administered 2024-09-04: 8 mL via INTRAVENOUS

## 2024-09-09 ENCOUNTER — Ambulatory Visit: Payer: Self-pay | Admitting: Family Medicine

## 2024-09-18 ENCOUNTER — Other Ambulatory Visit: Payer: Self-pay | Admitting: Family Medicine

## 2024-09-18 DIAGNOSIS — E1165 Type 2 diabetes mellitus with hyperglycemia: Secondary | ICD-10-CM

## 2024-12-04 ENCOUNTER — Encounter: Admitting: Family Medicine
